# Patient Record
Sex: Female | Born: 1935 | Race: White | Hispanic: No | Smoking: Never smoker
Health system: Southern US, Community
[De-identification: ages and names within clinical notes are randomized; demographics above are authoritative.]

## PROBLEM LIST (undated history)

## (undated) DIAGNOSIS — G114 Hereditary spastic paraplegia: Secondary | ICD-10-CM

## (undated) DIAGNOSIS — I10 Essential (primary) hypertension: Secondary | ICD-10-CM

## (undated) DIAGNOSIS — R7303 Prediabetes: Secondary | ICD-10-CM

## (undated) DIAGNOSIS — K649 Unspecified hemorrhoids: Secondary | ICD-10-CM

## (undated) DIAGNOSIS — I639 Cerebral infarction, unspecified: Secondary | ICD-10-CM

## (undated) DIAGNOSIS — J189 Pneumonia, unspecified organism: Secondary | ICD-10-CM

## (undated) DIAGNOSIS — M549 Dorsalgia, unspecified: Secondary | ICD-10-CM

## (undated) DIAGNOSIS — K219 Gastro-esophageal reflux disease without esophagitis: Secondary | ICD-10-CM

## (undated) DIAGNOSIS — I679 Cerebrovascular disease, unspecified: Secondary | ICD-10-CM

## (undated) DIAGNOSIS — Z87442 Personal history of urinary calculi: Secondary | ICD-10-CM

## (undated) DIAGNOSIS — E7800 Pure hypercholesterolemia, unspecified: Secondary | ICD-10-CM

## (undated) DIAGNOSIS — E785 Hyperlipidemia, unspecified: Secondary | ICD-10-CM

## (undated) DIAGNOSIS — I2510 Atherosclerotic heart disease of native coronary artery without angina pectoris: Secondary | ICD-10-CM

## (undated) DIAGNOSIS — M109 Gout, unspecified: Secondary | ICD-10-CM

## (undated) DIAGNOSIS — G43909 Migraine, unspecified, not intractable, without status migrainosus: Secondary | ICD-10-CM

## (undated) DIAGNOSIS — IMO0001 Reserved for inherently not codable concepts without codable children: Secondary | ICD-10-CM

## (undated) DIAGNOSIS — I739 Peripheral vascular disease, unspecified: Secondary | ICD-10-CM

## (undated) DIAGNOSIS — G259 Extrapyramidal and movement disorder, unspecified: Secondary | ICD-10-CM

## (undated) DIAGNOSIS — M1990 Unspecified osteoarthritis, unspecified site: Secondary | ICD-10-CM

## (undated) DIAGNOSIS — D759 Disease of blood and blood-forming organs, unspecified: Secondary | ICD-10-CM

## (undated) HISTORY — DX: Atherosclerotic heart disease of native coronary artery without angina pectoris: I25.10

## (undated) HISTORY — PX: HEMORRHOID SURGERY: SHX153

## (undated) HISTORY — PX: TONSILLECTOMY: SUR1361

## (undated) HISTORY — DX: Extrapyramidal and movement disorder, unspecified: G25.9

## (undated) HISTORY — PX: CATARACT EXTRACTION W/ INTRAOCULAR LENS  IMPLANT, BILATERAL: SHX1307

## (undated) HISTORY — PX: COLONOSCOPY: SHX174

## (undated) HISTORY — DX: Hereditary spastic paraplegia: G11.4

## (undated) HISTORY — PX: DILATION AND CURETTAGE OF UTERUS: SHX78

## (undated) HISTORY — DX: Gastro-esophageal reflux disease without esophagitis: K21.9

## (undated) HISTORY — DX: Migraine, unspecified, not intractable, without status migrainosus: G43.909

## (undated) HISTORY — DX: Pure hypercholesterolemia, unspecified: E78.00

## (undated) HISTORY — PX: APPENDECTOMY: SHX54

## (undated) HISTORY — DX: Cerebrovascular disease, unspecified: I67.9

## (undated) HISTORY — DX: Gout, unspecified: M10.9

## (undated) HISTORY — DX: Dorsalgia, unspecified: M54.9

## (undated) HISTORY — PX: CHOLECYSTECTOMY: SHX55

## (undated) HISTORY — DX: Reserved for inherently not codable concepts without codable children: IMO0001

## (undated) HISTORY — PX: TUBAL LIGATION: SHX77

## (undated) HISTORY — PX: ROTATOR CUFF REPAIR: SHX139

---

## 1998-02-10 ENCOUNTER — Inpatient Hospital Stay (HOSPITAL_COMMUNITY): Admission: RE | Admit: 1998-02-10 | Discharge: 1998-02-16 | Payer: Self-pay | Admitting: Specialist

## 1998-02-16 ENCOUNTER — Inpatient Hospital Stay
Admission: RE | Admit: 1998-02-16 | Discharge: 1998-03-01 | Payer: Self-pay | Admitting: Physical Medicine and Rehabilitation

## 1999-05-31 ENCOUNTER — Encounter: Payer: Self-pay | Admitting: Neurology

## 1999-05-31 ENCOUNTER — Encounter: Payer: Self-pay | Admitting: Emergency Medicine

## 1999-05-31 ENCOUNTER — Inpatient Hospital Stay (HOSPITAL_COMMUNITY): Admission: EM | Admit: 1999-05-31 | Discharge: 1999-06-02 | Payer: Self-pay | Admitting: Neurology

## 1999-06-01 ENCOUNTER — Encounter: Payer: Self-pay | Admitting: Neurology

## 1999-10-03 ENCOUNTER — Encounter: Payer: Self-pay | Admitting: Internal Medicine

## 1999-10-03 ENCOUNTER — Encounter: Admission: RE | Admit: 1999-10-03 | Discharge: 1999-10-03 | Payer: Self-pay | Admitting: Internal Medicine

## 2000-03-21 ENCOUNTER — Inpatient Hospital Stay (HOSPITAL_COMMUNITY): Admission: RE | Admit: 2000-03-21 | Discharge: 2000-03-25 | Payer: Self-pay | Admitting: Specialist

## 2001-03-03 ENCOUNTER — Encounter: Payer: Self-pay | Admitting: Family Medicine

## 2001-03-03 ENCOUNTER — Encounter: Admission: RE | Admit: 2001-03-03 | Discharge: 2001-03-03 | Payer: Self-pay | Admitting: Family Medicine

## 2001-08-19 ENCOUNTER — Other Ambulatory Visit: Admission: RE | Admit: 2001-08-19 | Discharge: 2001-08-19 | Payer: Self-pay | Admitting: Family Medicine

## 2001-11-12 ENCOUNTER — Emergency Department (HOSPITAL_COMMUNITY): Admission: EM | Admit: 2001-11-12 | Discharge: 2001-11-12 | Payer: Self-pay | Admitting: Emergency Medicine

## 2002-09-19 ENCOUNTER — Inpatient Hospital Stay (HOSPITAL_COMMUNITY): Admission: EM | Admit: 2002-09-19 | Discharge: 2002-09-20 | Payer: Self-pay | Admitting: Emergency Medicine

## 2002-09-20 ENCOUNTER — Encounter: Payer: Self-pay | Admitting: Internal Medicine

## 2002-11-08 ENCOUNTER — Ambulatory Visit (HOSPITAL_COMMUNITY): Admission: RE | Admit: 2002-11-08 | Discharge: 2002-11-08 | Payer: Self-pay | Admitting: Internal Medicine

## 2003-03-15 ENCOUNTER — Ambulatory Visit (HOSPITAL_COMMUNITY): Admission: RE | Admit: 2003-03-15 | Discharge: 2003-03-15 | Payer: Self-pay | Admitting: Gastroenterology

## 2003-06-23 ENCOUNTER — Encounter: Admission: RE | Admit: 2003-06-23 | Discharge: 2003-06-23 | Payer: Self-pay | Admitting: Neurology

## 2003-06-27 ENCOUNTER — Inpatient Hospital Stay (HOSPITAL_COMMUNITY): Admission: EM | Admit: 2003-06-27 | Discharge: 2003-06-29 | Payer: Self-pay | Admitting: Emergency Medicine

## 2005-05-20 ENCOUNTER — Ambulatory Visit (HOSPITAL_COMMUNITY): Admission: RE | Admit: 2005-05-20 | Discharge: 2005-05-20 | Payer: Self-pay | Admitting: Gastroenterology

## 2005-06-02 ENCOUNTER — Emergency Department (HOSPITAL_COMMUNITY): Admission: EM | Admit: 2005-06-02 | Discharge: 2005-06-02 | Payer: Self-pay | Admitting: Emergency Medicine

## 2006-03-25 ENCOUNTER — Encounter: Admission: RE | Admit: 2006-03-25 | Discharge: 2006-03-25 | Payer: Self-pay | Admitting: Family Medicine

## 2007-11-12 ENCOUNTER — Emergency Department (HOSPITAL_COMMUNITY): Admission: EM | Admit: 2007-11-12 | Discharge: 2007-11-12 | Payer: Self-pay | Admitting: Emergency Medicine

## 2008-08-26 ENCOUNTER — Encounter: Admission: RE | Admit: 2008-08-26 | Discharge: 2008-08-26 | Payer: Self-pay | Admitting: *Deleted

## 2008-09-06 ENCOUNTER — Ambulatory Visit (HOSPITAL_COMMUNITY): Admission: RE | Admit: 2008-09-06 | Discharge: 2008-09-06 | Payer: Self-pay | Admitting: Gastroenterology

## 2009-02-15 ENCOUNTER — Emergency Department (HOSPITAL_COMMUNITY): Admission: EM | Admit: 2009-02-15 | Discharge: 2009-02-15 | Payer: Self-pay | Admitting: Emergency Medicine

## 2009-02-22 ENCOUNTER — Encounter: Admission: RE | Admit: 2009-02-22 | Discharge: 2009-02-22 | Payer: Self-pay | Admitting: Gastroenterology

## 2009-03-10 ENCOUNTER — Emergency Department (HOSPITAL_COMMUNITY): Admission: EM | Admit: 2009-03-10 | Discharge: 2009-03-10 | Payer: Self-pay | Admitting: Emergency Medicine

## 2009-03-13 ENCOUNTER — Inpatient Hospital Stay (HOSPITAL_COMMUNITY): Admission: EM | Admit: 2009-03-13 | Discharge: 2009-03-17 | Payer: Self-pay | Admitting: Emergency Medicine

## 2009-05-23 ENCOUNTER — Ambulatory Visit (HOSPITAL_COMMUNITY): Admission: RE | Admit: 2009-05-23 | Discharge: 2009-05-23 | Payer: Self-pay | Admitting: Gastroenterology

## 2010-05-09 ENCOUNTER — Encounter: Admission: RE | Admit: 2010-05-09 | Discharge: 2010-05-09 | Payer: Self-pay | Admitting: Family Medicine

## 2010-07-30 ENCOUNTER — Encounter: Payer: Self-pay | Admitting: Gastroenterology

## 2010-10-10 LAB — COMPREHENSIVE METABOLIC PANEL
ALT: 20 U/L (ref 0–35)
AST: 23 U/L (ref 0–37)
Albumin: 3.8 g/dL (ref 3.5–5.2)
Alkaline Phosphatase: 98 U/L (ref 39–117)
BUN: 19 mg/dL (ref 6–23)
CO2: 23 mEq/L (ref 19–32)
Calcium: 9.1 mg/dL (ref 8.4–10.5)
Chloride: 110 mEq/L (ref 96–112)
Creatinine, Ser: 0.86 mg/dL (ref 0.4–1.2)
GFR calc Af Amer: >60 mL/min (ref >60)
GFR calc non Af Amer: >60 mL/min (ref >60)
Glucose, Bld: 111 mg/dL — ABNORMAL HIGH (ref 70–99)
Potassium: 4 mEq/L (ref 3.5–5.1)
Sodium: 140 mEq/L (ref 135–145)
Total Bilirubin: 0.8 mg/dL (ref 0.3–1.2)
Total Protein: 6.8 g/dL (ref 6.0–8.3)

## 2010-10-10 LAB — DIFFERENTIAL
Basophils Absolute: 0 10*3/uL (ref 0.0–0.1)
Basophils Relative: 1 % (ref 0–1)
Eosinophils Absolute: 0.1 10*3/uL (ref 0.0–0.7)
Eosinophils Relative: 2 % (ref 0–5)
Lymphocytes Relative: 23 % (ref 12–46)
Lymphs Abs: 1.2 10*3/uL (ref 0.7–4.0)
Monocytes Absolute: 0.3 10*3/uL (ref 0.1–1.0)
Monocytes Relative: 6 % (ref 3–12)
Neutro Abs: 3.5 10*3/uL (ref 1.7–7.7)
Neutrophils Relative %: 68 % (ref 43–77)

## 2010-10-10 LAB — CBC
HCT: 35.4 % — ABNORMAL LOW (ref 36.0–46.0)
Hemoglobin: 12 g/dL (ref 12.0–15.0)
MCHC: 33.8 g/dL (ref 30.0–36.0)
MCV: 90.8 fL (ref 78.0–100.0)
Platelets: 192 10*3/uL (ref 150–400)
RBC: 3.89 MIL/uL (ref 3.87–5.11)
RDW: 14.5 % (ref 11.5–15.5)
WBC: 5.2 10*3/uL (ref 4.0–10.5)

## 2010-10-10 LAB — PROTIME-INR
INR: 1.02 (ref 0.00–1.49)
Prothrombin Time: 13.3 seconds (ref 11.6–15.2)

## 2010-10-10 LAB — TYPE AND SCREEN
ABO/RH(D): O POS
Antibody Screen: NEGATIVE

## 2010-10-12 LAB — GAMMA GT
GGT: 343 U/L — ABNORMAL HIGH (ref 7–51)
GGT: 634 U/L — ABNORMAL HIGH (ref 7–51)

## 2010-10-12 LAB — DIFFERENTIAL
Basophils Absolute: 0 10*3/uL (ref 0.0–0.1)
Basophils Absolute: 0 10*3/uL (ref 0.0–0.1)
Basophils Absolute: 0 10*3/uL (ref 0.0–0.1)
Basophils Relative: 0 % (ref 0–1)
Eosinophils Absolute: 0 10*3/uL (ref 0.0–0.7)
Eosinophils Relative: 0 % (ref 0–5)
Lymphocytes Relative: 2 % — ABNORMAL LOW (ref 12–46)
Lymphocytes Relative: 3 % — ABNORMAL LOW (ref 12–46)
Lymphocytes Relative: 8 % — ABNORMAL LOW (ref 12–46)
Lymphs Abs: 0.2 10*3/uL — ABNORMAL LOW (ref 0.7–4.0)
Monocytes Absolute: 0.3 10*3/uL (ref 0.1–1.0)
Monocytes Relative: 6 % (ref 3–12)
Neutro Abs: 8.6 10*3/uL — ABNORMAL HIGH (ref 1.7–7.7)
Neutrophils Relative %: 81 % — ABNORMAL HIGH (ref 43–77)
Neutrophils Relative %: 92 % — ABNORMAL HIGH (ref 43–77)

## 2010-10-12 LAB — COMPREHENSIVE METABOLIC PANEL
ALT: 45 U/L — ABNORMAL HIGH (ref 0–35)
ALT: 56 U/L — ABNORMAL HIGH (ref 0–35)
AST: 63 U/L — ABNORMAL HIGH (ref 0–37)
AST: 88 U/L — ABNORMAL HIGH (ref 0–37)
Albumin: 2.3 g/dL — ABNORMAL LOW (ref 3.5–5.2)
Alkaline Phosphatase: 460 U/L — ABNORMAL HIGH (ref 39–117)
Alkaline Phosphatase: 467 U/L — ABNORMAL HIGH (ref 39–117)
Alkaline Phosphatase: 496 U/L — ABNORMAL HIGH (ref 39–117)
BUN: 21 mg/dL (ref 6–23)
BUN: 8 mg/dL (ref 6–23)
BUN: 9 mg/dL (ref 6–23)
CO2: 22 mEq/L (ref 19–32)
CO2: 25 mEq/L (ref 19–32)
Calcium: 7.8 mg/dL — ABNORMAL LOW (ref 8.4–10.5)
Calcium: 7.9 mg/dL — ABNORMAL LOW (ref 8.4–10.5)
Chloride: 109 mEq/L (ref 96–112)
Chloride: 109 mEq/L (ref 96–112)
Creatinine, Ser: 0.77 mg/dL (ref 0.4–1.2)
Creatinine, Ser: 0.87 mg/dL (ref 0.4–1.2)
Creatinine, Ser: 0.87 mg/dL (ref 0.4–1.2)
Creatinine, Ser: 1.17 mg/dL (ref 0.4–1.2)
GFR calc Af Amer: >60 mL/min (ref >60)
GFR calc Af Amer: >60 mL/min (ref >60)
GFR calc non Af Amer: >60 mL/min (ref >60)
GFR calc non Af Amer: >60 mL/min (ref >60)
Glucose, Bld: 103 mg/dL — ABNORMAL HIGH (ref 70–99)
Glucose, Bld: 146 mg/dL — ABNORMAL HIGH (ref 70–99)
Potassium: 2.7 mEq/L — CL (ref 3.5–5.1)
Potassium: 2.8 mEq/L — ABNORMAL LOW (ref 3.5–5.1)
Potassium: 3.3 mEq/L — ABNORMAL LOW (ref 3.5–5.1)
Sodium: 138 mEq/L (ref 135–145)
Sodium: 139 mEq/L (ref 135–145)
Total Bilirubin: 3.2 mg/dL — ABNORMAL HIGH (ref 0.3–1.2)
Total Bilirubin: 4.5 mg/dL — ABNORMAL HIGH (ref 0.3–1.2)
Total Bilirubin: 6.8 mg/dL — ABNORMAL HIGH (ref 0.3–1.2)
Total Protein: 4.7 g/dL — ABNORMAL LOW (ref 6.0–8.3)
Total Protein: 4.9 g/dL — ABNORMAL LOW (ref 6.0–8.3)
Total Protein: 5.6 g/dL — ABNORMAL LOW (ref 6.0–8.3)
Total Protein: 6.1 g/dL (ref 6.0–8.3)

## 2010-10-12 LAB — LIPID PANEL
Cholesterol: 122 mg/dL (ref 0–200)
HDL: 20 mg/dL — ABNORMAL LOW (ref >39)
LDL Cholesterol: 73 mg/dL (ref 0–99)
Total CHOL/HDL Ratio: 6.1 RATIO
Triglycerides: 144 mg/dL (ref <150)

## 2010-10-12 LAB — CBC
HCT: 30.7 % — ABNORMAL LOW (ref 36.0–46.0)
HCT: 37.1 % (ref 36.0–46.0)
HCT: 40.1 % (ref 36.0–46.0)
Hemoglobin: 10.6 g/dL — ABNORMAL LOW (ref 12.0–15.0)
Hemoglobin: 13.7 g/dL (ref 12.0–15.0)
MCHC: 33.8 g/dL (ref 30.0–36.0)
MCHC: 34.1 g/dL (ref 30.0–36.0)
MCV: 90.4 fL (ref 78.0–100.0)
MCV: 91 fL (ref 78.0–100.0)
MCV: 91.6 fL (ref 78.0–100.0)
MCV: 91.8 fL (ref 78.0–100.0)
MCV: 92.3 fL (ref 78.0–100.0)
Platelets: 100 10*3/uL — ABNORMAL LOW (ref 150–400)
Platelets: 116 10*3/uL — ABNORMAL LOW (ref 150–400)
Platelets: 130 10*3/uL — ABNORMAL LOW (ref 150–400)
Platelets: 206 10*3/uL (ref 150–400)
RBC: 3.34 MIL/uL — ABNORMAL LOW (ref 3.87–5.11)
RBC: 3.48 MIL/uL — ABNORMAL LOW (ref 3.87–5.11)
RDW: 13.9 % (ref 11.5–15.5)
RDW: 15.1 % (ref 11.5–15.5)
RDW: 15.3 % (ref 11.5–15.5)
RDW: 15.5 % (ref 11.5–15.5)
WBC: 7.3 10*3/uL (ref 4.0–10.5)
WBC: 9.3 10*3/uL (ref 4.0–10.5)

## 2010-10-12 LAB — BASIC METABOLIC PANEL
BUN: 15 mg/dL (ref 6–23)
Calcium: 8.2 mg/dL — ABNORMAL LOW (ref 8.4–10.5)
Creatinine, Ser: 1 mg/dL (ref 0.4–1.2)
GFR calc Af Amer: >60 mL/min (ref >60)
GFR calc non Af Amer: 53 mL/min — ABNORMAL LOW (ref >60)

## 2010-10-12 LAB — TSH: TSH: 0.53 u[IU]/mL (ref 0.350–4.500)

## 2010-10-12 LAB — LIPASE, BLOOD
Lipase: 14 U/L (ref 11–59)
Lipase: 15 U/L (ref 11–59)

## 2010-10-12 LAB — URINALYSIS, ROUTINE W REFLEX MICROSCOPIC
Glucose, UA: NEGATIVE mg/dL
pH: 5.5 (ref 5.0–8.0)

## 2010-10-12 LAB — HEMOGLOBIN A1C: Mean Plasma Glucose: 134 mg/dL

## 2010-10-12 LAB — URINE MICROSCOPIC-ADD ON

## 2010-10-12 LAB — ANA: Anti Nuclear Antibody(ANA): NEGATIVE

## 2010-10-12 LAB — LACTATE DEHYDROGENASE: LDH: 163 U/L (ref 94–250)

## 2010-10-12 LAB — H. PYLORI ANTIBODY, IGG: H Pylori IgG: <0.4 {ISR}

## 2010-10-12 LAB — LACTIC ACID, PLASMA: Lactic Acid, Venous: 1.1 mmol/L (ref 0.5–2.2)

## 2010-10-12 LAB — HEPATIC FUNCTION PANEL
Albumin: 2.3 g/dL — ABNORMAL LOW (ref 3.5–5.2)
Total Bilirubin: 5.8 mg/dL — ABNORMAL HIGH (ref 0.3–1.2)

## 2010-10-12 LAB — PROTIME-INR: Prothrombin Time: 14.9 seconds (ref 11.6–15.2)

## 2010-10-12 LAB — URINE CULTURE

## 2010-10-13 LAB — DIFFERENTIAL
Basophils Relative: 1 % (ref 0–1)
Lymphocytes Relative: 5 % — ABNORMAL LOW (ref 12–46)
Monocytes Absolute: 0.7 10*3/uL (ref 0.1–1.0)
Monocytes Relative: 8 % (ref 3–12)
Neutro Abs: 7.6 10*3/uL (ref 1.7–7.7)

## 2010-10-13 LAB — CBC
HCT: 37.5 % (ref 36.0–46.0)
Platelets: 180 10*3/uL (ref 150–400)
RDW: 12.9 % (ref 11.5–15.5)

## 2010-10-13 LAB — COMPREHENSIVE METABOLIC PANEL
Albumin: 3.4 g/dL — ABNORMAL LOW (ref 3.5–5.2)
Alkaline Phosphatase: 253 U/L — ABNORMAL HIGH (ref 39–117)
BUN: 18 mg/dL (ref 6–23)
Potassium: 3.5 mEq/L (ref 3.5–5.1)
Total Protein: 6.2 g/dL (ref 6.0–8.3)

## 2010-10-13 LAB — POCT CARDIAC MARKERS
CKMB, poc: 1.1 ng/mL (ref 1.0–8.0)
Myoglobin, poc: 198 ng/mL (ref 12–200)
Troponin i, poc: <0.05 ng/mL (ref 0.00–0.09)

## 2010-11-23 NOTE — Discharge Summary (Signed)
Vicki Stein, Vicki Stein                         ACCOUNT NO.:  [id number]   MEDICAL RECORD NO.:  [id number]                   PATIENT TYPE:  INP   LOCATION:  5504                                 FACILITY:  [facility]   PHYSICIAN:  Deirdre Peer. Polite, M.D.              DATE OF BIRTH:  03-16-1928   DATE OF ADMISSION:  06/27/2003  DATE OF DISCHARGE:  06/29/2003                                 DISCHARGE SUMMARY   DISCHARGE DIAGNOSES:  1. Transient ischemic attack characterized by transient left arm numbness x     10 minutes; no residual deficits.  2. History of cerebrovascular accident.  3. Hypertension.  4. Hypercholesterolemia.  5. Depression.  6. Anxiety.  7. Gastroesophageal reflux disease.   DISCHARGE MEDICATIONS:  1. Aggrenox 25/200 one tablet p.o. b.i.d.  The patient is asked to resume home medications.  1. Lisinopril/hydrochlorothiazide daily.  2. Xanax 0.5 mg q.h.s. p.r.n.  3. Patanol eye drops b.i.d.  4. Robaxin 500 mg t.i.d. as needed.  5. Prozac 20 mg daily.  6. Reglan 10 mg a.c. and h.s.  7. Nexium 20 mg daily.  8. Zocor 20 mg daily.  9. Allegra 180 mg daily.  10.      Magnesium sulfate.  11.      Calcium chloride.  12.      Flonase.  13.      Prilosec.   CONSULTATIONS:  None.   STUDIES:  1. The patient had carotid Doppler ultrasound which showed no internal     carotid stenosis.  2. The patient had CT of the head which showed possible left internal     carotid infarct.  3. MRI/MRA: MRI without acute infarct.  MRA without any stenosis.  4. EKG: Normal sinus rhythm.  5. BMET  within normal limits..  6. Total cholesterol 208, HDL 44, LDL 126.   DISPOSITION:  The patient is to be discharged home in stable condition for  followup with primary MD in approximately 7 to 10 days.   HISTORY OF PRESENT ILLNESS:  Vicki Stein is a 77 year old female with above  medical problems who presented to the ED with left hand and arm clumsiness  and paresthesia that lasted  approximately 5 to 10 minutes.  The patient also  described right side of her tongue felt numb.  Because of her prior history  of TIAs in the past, the patient was presented to the ED for further  evaluation and treatment.   PAST MEDICAL HISTORY:  As stated above.   MEDICATIONS:  As stated above.   SOCIAL HISTORY:  Negative for tobacco, alcohol, or drugs.   FAMILY HISTORY:  Noncontributory.   HOSPITAL COURSE:  The patient was admitted to a medicine floor bed for  evaluation and treatment of transient neurologic deficit characterized by  clumsiness and paresthesia.  Of note, the patient did have a CT in the ED  which  showed questionable left internal  carotid infarct of any acute  nature.  The patient was placed on telemetry.  She was found to be in normal  sinus rhythm.  Had further studies, carotid ultrasound which showed no  stenosis and MRI/MRA.  The patient's MRI was negative for any acute infarct;  therefore, making finding on CT an artifact.  The patient also had an MRA  which showed no acute stenosis.  The patient had screening lab work which  revealed her lipids to be greater than optimal.  Her LDL is at 126.  It will  be recommended that the patient increase her Zocor to 40 mg daily.   In the interim, the patient was seen by PT and OT, and no needs were  identified.  As stated, the patient's deficit only lasted approximately 10  minutes at home.  Her MRI does not show acute finding.  This is most  consistent with a TIA.  The patient was on aspirin for CVA prophylaxis.  At  this time, we will initiate Aggrenox 1 tablet b.i.d.  The patient will have  further risk factor modification and followup on outpatient basis by her  primary MD.                                                Deirdre Peer. Polite, M.D.    RDP/MEDQ  D:  06/29/2003  T:  07/01/2003  Job:  [id number]   cc:   Molly Maduro L. Foy [place], M.D.  [address] [place]  [place] [address]  Fax: [phone]

## 2010-11-23 NOTE — H&P (Signed)
[facility]  Patient:    Vicki Stein, Vicki Stein                     MRN: [id number] Adm. Date:  03/21/00 Attending:  R. Valma Cava, M.D. Dictator:   Druscilla Brownie. Shela Nevin, P.A. CC:         Janae Bridgeman. Eloise Harman., M.D.   History and Physical  DATE OF BIRTH:  03-01-1928.  DATE OF HISTORY AND PHYSICAL:  March 13, 2000.  CHIEF COMPLAINT:  "Pain in my right shoulder."  HISTORY OF PRESENT ILLNESS:  This is a 77 year old lady who has been seen by [place] for continuing problems concerning her right shoulder.  We have had to inject corticosteroids into the right shoulder, which really has not helped with her overall pain.  She has limitation range of motion, positive impingement, and x-rays have shown a high-riding humeral head, degenerative changes of the glenoid, spurring of the acromion, and a hypertrophic degenerative AC joint.  This lady has spasticity with scissor gait in the lower extremities and must depend on her upper extremities to get about.  She uses a cane and various other appliances that assist her in walking.  For this reason, it is imperative that she have excellent shoulder function.  After much discussion, it was decided to go ahead with right shoulder open subacromial decompression, distal clavicle resection, and repair of the rotator cuff of the right shoulder.  The patient has been seen preoperatively by Dr. Dimas Alexandria, who has cleared her for surgery.  She is on chronic aspirin from an old "stroke," and she will stop this seven days prior to surgery.  PAST MEDICAL HISTORY:  Stroke in the past, as mentioned above.  She also has a stomach disorder with spasticity.  She also has some mild hypertension.  She has had a cholecystectomy in 1997, left shoulder repair of the rotator cuff on February 10, 1998, by Dr. Thomasena Edis.  MEDICATIONS:  Zocor tablet 20 mg in the evening, Prempro 0.625 mg/2.5 mg one q.d., _____ 80 mg one q.d.,  Plendil 2.5 mg one q.d., Celebrex 200 mg one q.d. , Vitamin E a day, Prilosec capsule one p.o. q.d., Allegra capsule 60 mg one b.i.d., Flonase spray q.a.m., one coated aspirin 325 q.d., will stop seven days prior to surgery.  ALLERGIES:  PENICILLIN, which causes swelling and rash, CODEINE and HYDROCODONE, which cause severe stomach cramps, as well as ULTRAM and REGULAR ASPIRIN.  SOCIAL HISTORY:  The patient lives alone, is independent, neither smokes nor drinks.  FAMILY HISTORY:  Positive for lower extremity spasticity.  REVIEW OF SYSTEMS:  CENTRAL NERVOUS SYSTEM:  No seizures, paralysis, numbness, double vision.  RESPIRATORY:  No productive cough, no hemoptysis, no shortness of breath.  CARDIOVASCULAR:  No chest pain, no angina, no orthopnea. GASTROINTESTINAL:  No nausea, vomiting, melena, or bloody stools. GENITOURINARY:  No discharge, dysuria, hematuria.  MUSCULOSKELETAL:  Primarily in present illness.  PHYSICAL EXAMINATION:  GENERAL:  An alert, cooperative, friendly 77 year old white female, who looks younger than her stated age.  VITAL SIGNS:  Blood pressure 148/78, pulse 80, respirations 12.  HEENT:  Normocephalic, wears glasses.  PERRLA.  Oropharynx is clear.  CHEST:  Clear to auscultation, no rhonchi, no rales.  HEART:  Rate, no murmurs are heard.  ABDOMEN:  Soft, nontender.  Liver and spleen not felt.  GENITALIA, RECTAL, PELVIC, BREASTS:  Not done, not pertinent to present illness.  MUSCULOSKELETAL:  Right shoulder as in present  illness above.  ADMITTING DIAGNOSES: 1. Right shoulder osteoarthritis with rotator cuff tear. 2. Hypertension. 3. Gastritis. 4. History of stroke. 5. Spasticity of the lower extremities.  PLAN:  The patient will undergo right shoulder open subacromial decompression, distal clavicle resection, and rotator cuff repair of the right shoulder. Should there be any medical problems, we will certainly call Dr. Dimas Alexandria to follow  along with [place].  The patient will need a home bath aide as well as occupational therapy after her discharge home so that she may resume her active lifestyle in the not-too-distant future. DD:  03/13/00 TD:  03/13/00 Job: [id number]

## 2010-11-23 NOTE — Op Note (Signed)
[facility]  Patient:    Vicki Stein, Vicki Stein                 MRN: [id number] Proc. Date: 03/21/00 Adm. Date:  [id number] Attending:  Erasmo Leventhal                           Operative Report  PREOPERATIVE DIAGNOSIS:  Right shoulder large rotator cuff tear with symptomatic degenerative acromioclavicular joint.  POSTOPERATIVE DIAGNOSIS:  Right shoulder large rotator cuff tear with symptomatic degenerative acromioclavicular joint.  OPERATION:  Right shoulder open subacromial decompression.  Rotator cuff repair.  Distal clavicle resection Mumford procedure.  SURGEON:  R. Valma Cava, M.D.  ASSISTANT: Alexzandrew L. Perkins, P.A.-C.  ANESTHESIA:  Preoperative interscalene block with general endotracheal.  ESTIMATED BLOOD LOSS:  Less than 20 cc.  DRAINS:  None.  COMPLICATIONS:  None.  DISPOSITION:  PACU stable.  DESCRIPTION OF PROCEDURE:  Patient was counseled in the holding area, correct side was identified.  The IV was started, antibiotics were given, block was administered.  She was taken to the operating room, placed in the supine position under general endotracheal anesthesia.  Patient was placed in a modified beach chair position.  Right shoulder examined.  Good range of motion, stable.  Prepped with DuraPrep and all draped in a sterile fashion. Oblique incision made over the [facility] joint, anterior acromion through the skin and subcutaneous tissue.  Small veins electrocoagulated.  The deltotrapezial fascia was split in line with the skin incision.  Subperiosteal dissection undertaken on the distal AC joint and clavicle and anterior acromion.  The clavipectoral fascia was opened, deltoid was split for a total of 4 cm. Axillary node was protected, and a retention suture was placed in order to prevent any inadvertent split in the deltoid.  Subacromial bursa was hypertroph8ied, was excised with scissors.  Distal clavicle was excised  approximately 1.5 cm.  She had a marked type 3 acromion.  Anterior inferior acromioplasty was performed and smoothed down with a rasp.  The rotator cuff was inspected.  She had a large tear with retraction.  It was mobilized on its bursal surface and intra-articular surface.  It could then be brought side to side.  Biceps tendon was intact but there was a biceps tendinopathy present but it did not require release or tenodesis.  At this point in time beginning medial and working laterally with #1 Ethibond sutures, sequential sutures were placed in the rotator cuff in a side to side fashion also incorporating the biceps tendon.  This was done with the arm at the side and no undue tension on the repair.  A trough was made laterally.  An ARTH-Rx absorbable port screw anchor was placed and then the tendon was repaired down to the bleeding bony bed.  At this time the suture line remained stable and there was no undue pressure. The joint was irrigated during the closure.  A meticulous closure deltotrapezial fascia and anterior deltoid was repaired back to the acromion with Vicryl interrupted in the fascia, subcu Vicryl, skin closed with staples.  Sterile dressings were applied at the shoulder.  She was placed on an abduction pillow.  There were no complications.  She was awakened, extubated and taken to the recovery room in stable condition. Sponge and needle count were correct. DD:  03/21/00 TD:  03/23/00 Job: [id number]

## 2010-11-23 NOTE — Discharge Summary (Signed)
[facility]  Patient:    Vicki Stein, Vicki Stein                 MRN: [id number] Adm. Date:  [id number] Disc. Date: [id number] Attending:  Erasmo Leventhal Dictator:   Della Goo, P.A.-C.                           Discharge Summary  ADMISSION DIAGNOSES: 1. Rotator cuff tear, right shoulder and osteoarthritis of the right shoulder. 2. Hypertension. 3. Gastritis. 4. History of stroke. 5. Spasticity of the lower extremities bilaterally.  DISCHARGE DIAGNOSES: 1. Rotator cuff tear, right shoulder and osteoarthritis of the right shoulder. 2. Hypertension. 3. Gastritis. 4. History of stroke. 5. Spasticity of the lower extremities bilaterally.  PROCEDURES:  On March 21, 2000, the patient underwent right shoulder subacromial decompression, rotator cuff repair, distal clavicle resection Mumford procedure performed by R. Valma Cava, M.D., assisted by Alexzandrew L. Perkins, P.A.-C. under general anesthesia.  CONSULTATIONS:  None.  BRIEF HISTORY:  The patient is a 77 year old white female with chronic right shoulder pain secondary to degenerative arthritis of the [facility] joint and right shoulder rotator cuff tear.  On exam, she had limited range of motion with positive impingement sign.  X-rays revealed high-riding humeral head with degenerative changes of the glenoid, spurring of the acromion, and hypertrophic degenerative AC joints.  It was felt she would require surgical intervention and was admitted for the procedure stated above.  HOSPITAL COURSE:  Unfortunately, the patient has a condition with spasticity and scissor gait in the lower extremities and must depend on the upper extremities to ambulate.  She utilizes a cane on a regular basis.  During the hospital stay, she was treated with physical therapy for ambulation and also occupational therapy for ADLs.  She required intensive therapy to become independent for return to her  home.  Prior to discharge, she was able to make gains with ambulation, walking as far as 75 feet with a hemiwalker and standby assistance in the hospital.  She received occupational therapy the day prior to discharge and had gained some independence with ADLs at that time.  The patient initially was treated with IV and IM analgesics and weaned to p.o. analgesics without difficulty.  She did develop a slight rash on the first postoperative day, cause unidentified.  She was afebrile, and vital signs were stable through much of the hospitalization.  Neurovascular and motor function remained intact in the right upper extremity.  Dressing was changed daily, and would was healing well without signs of drainage or infection.  She received a care management consult, and referral was made for OT and PT as well as bath aid.  The patient was felt to be stable for transfer to her home on March 25, 2000.  CONDITION UPON DISCHARGE: Stable.  LABORATORY DATA:  Admission labs include coag studies which were normal, CBC within normal limits, CMET within normal limits with exception of glucose 116. Urinalysis on admission was negative for urinary tract infection; however, it did show 100 protein and small hemoglobin.  Chest x-ray on admission was done at [facility] on May 29, 1999, and showed no active disease.  EKG on admission, also taken from [facility], showed sinus tachycardia with rate of 100 with no significant change.  PLAN:  The patient will be discharged to his home.  Arrangements have been made for her to have home health physical therapy and occupational  therapy. She will also have a bath aide.  Dressing change will be done daily, and she is given supplies to do so with the assistance of the aide at home.  The patient will be allowed to shower.  She will wear her sling at all times with removal only for pendulum exercises.  DISCHARGE MEDICATIONS: 1.  Percocet, #30, 1 to 2 every 4 to 6 hours as needed for pain. 2. Robaxin 500 mg, #30, 1 every 8 hours as needed for spasm.  FOLLOWUP:  With Dr. Thomasena Edis two weeks from the date of her surgery.  All questions have been encouraged and discussed, and she has been advised to call the office if she has questions or concerns prior to her return office visit. DD:  03/25/00 TD:  03/26/00 Job: [id number]

## 2010-11-23 NOTE — Discharge Summary (Signed)
[place]  Patient:    Vicki Stein                  MRN: [id number] Adm. Date:  [id number] Disc. Date: 06/02/99 Attending:  Baldo Daub CC:         Janae Bridgeman Eloise Harman., M.D.                           Discharge Summary  DIAGNOSES: 1. Left parietal infarction. 2. Hypertension. 3. Hypercholesterolemia. 4. Rotator cuff, right shoulder. 5. Gastroesophageal reflux disease.  PROCEDURES: 1. MRI/MRA of the brain. 2. Transthoracic echocardiogram. 3. Carotid Doppler ultrasound.  SUMMARY OF HOSPITALIZATION:  Patient is a 77 year old woman with past medical history of hypertension, hypercholesterolemia who presented to [facility] Emergency Room with a history of three episodes of slurred speech and numbness f her tongue, lasted between 10-15 minutes.  Upon admission her neurological exam was essentially normal.  The patient was subsequently admitted to the neuroscience nit for further workup and testing for cerebrovascular disease.  Initial management  consisted of IV fluids management, normal saline, oxygen 2 L, and heparin ischemic stroke protocol.  Testing including MRI/MRA of the brain showed an acute left parietal infarction, no hemodynamically significant stenosis.  Transthoracic echocardiogram essentially  normal.  Ejection fraction normal.  No thrombus.  Patient remained stable throughout hospitalization.  Neurologic status normal at discharge.  Patient was discharged home in stable condition.  DISCHARGE MEDICATIONS: 1. Prilosec 20 one q.d. 2. Thiamine 80 one q.d. 3. Celebrex 200 q.d. 4. Zocor 20 one q.d. 5. Prempro 0.625 one q.d. 6. Allegra 60 b.i.d. 7. Enteric-coated aspirin 325 mg q.d. for secondary stroke prevention.  OUTPATIENT FOLLOWUP:  Patient was given instructions to call her family care physician, Dr. Lendell Caprice, for a followup in 2-3 weeks from discharge. DD: 06/02/99 TD:  06/02/99 Job:  [id number]

## 2010-11-23 NOTE — H&P (Signed)
[place]  Patient:    Vicki Stein                  MRN: [id number] Adm. Date:  [id number] Attending:  Glean Hess D                         History and Physical  CHIEF COMPLAINT:  Slurred speech.  HISTORY OF PRESENT ILLNESS:  Patient is a 77 year old woman who presented to the emergency room at [facility] with three episodes of slurred speech lasting about 10 to 12 minutes.  The first one happened yesterday around 2 p.m. while she was  talking on the phone to her son, the second episode, early at lunch-time, when alf of her tongue got numb as well and the last episode this afternoon when she was  also noted to have drooping of her face and the slurred speech lasted about 10 o 15 minutes.  There is no prior history of strokes or TIAs.  The patient denies weakness of her arms or legs.  No ataxia.  No double vision.  PAST MEDICAL HISTORY:  Hypercholesterolemia.  Hypertension.  Rotator cuff of the right shoulder.  There is spastic stiffness of the lower extremities, congenital disorder.  Gastroesophageal reflux disease and chronic headaches.  MEDICATIONS: 1. Prilosec 20 mg once a day. 2. Diovan 80 mg once a day. 3. Celebrex 200 mg q.d. 4. Zocor 20 mg once a day. 5. Prempro 0.625 mg once a day. 6. Allegra 60 mg twice a day. 7. Flonase nasal spray p.r.n.  PRIMARY CARE PHYSICIAN:  Janae Bridgeman. Eloise Harman., M.D.  ALLERGIES:  PENICILLIN, CODEINE, MOTRIN, IBUPROFEN and ADVIL.  SOCIAL HISTORY:  Lives by herself, independent.  She uses a cane for ambulation. Nonsmoker and nondrinker.  FAMILY HISTORY:  Mother and father died of old age.  No family history of strokes or TIAs.  PHYSICAL EXAMINATION:  VITAL SIGNS:  Blood pressure 162/84, pulse 87, respirations 20, temperature 98.2.  GENERAL:  Mildly obese woman lying on the stretcher in no acute distress.  HEENT:  Head normocephalic, atraumatic.  NECK:  Supple.  No  bruits.  LUNGS:  Clear bilaterally.  HEART SOUNDS:  Regular and rhythmic.  No murmurs.  ABDOMEN:  Soft.  Bowel sounds present.  No visceromegaly.  EXTREMITIES:  No cyanosis or edema.  NEUROLOGIC:  Awake, alert and oriented.  Speech, hearing, memory and language normal.  Cranial nerves II-XII:  Pupils equal, round and reactive.  Bilateral extraoculocephalic movement intact.  Face symmetric.  Tongue in the midline. Palate elevates symmetrically.  Hearing intact.  Visual fields full to confrontation.  Motor strength:  Upper extremities 5/5, lower extremities 4/5; equal bilaterally. No focal abnormalities.  Motor tone increased in both lower extremities. Reflexes +3, lower extremities.  Plantars upgoing bilaterally.  Coordination normal. Sensory normal.  Gait was not evaluated.  NEUROIMAGING:  I have personally reviewed CT scan of the head which shows only atrophy, no acute changes, no hemorrhage, no hydrocephalus.  IMPRESSION: 1. Transient ischemic attack. 2. Hypertension. 3. Hypercholesterolemia. 4. Rotator cuff, right shoulder. 5. Gastroesophageal reflux disease.  PLAN AND RECOMMENDATIONS:  Diagnosis, condition and further interventions were discussed at length with the patient and her family at the bedside.  Would like to admit this patient to the hospital for further workup and testing for cerebrovascular disease.  Even though the patient has had only TIA symptoms, it is well-recognized that the risks of a  stroke in the next 30 days is between 6 to 0% of having a stroke.  Further workup and testing will consist of noninvasive imaging of her intra- and extracranial vessels with magnetic resonance angiography and carotid ultrasound; also, an echocardiogram will be requested for further evaluation of potential embolic sources as the cause of her TIAs.  Until the workup is completed, patient will remain on the heparin ischemic stroke protocol to maintain PTTs between  60 and 80 seconds.  Will resume home medications except for antihypertensive medications until workup is completed.  Thank you for allowing me to participate in the care of this patient. DD:  05/31/99 TD:  06/01/99 Job: [id number]

## 2010-11-23 NOTE — H&P (Signed)
NAME:  Vicki Stein, Vicki Stein                         ACCOUNT NO.:  [id number]   MEDICAL RECORD NO.:  [id number]                   PATIENT TYPE:  INP   LOCATION:  1843                                 FACILITY:  [facility]   PHYSICIAN:  Corinna L. Lendell Caprice, MD             DATE OF BIRTH:  1928/03/21   DATE OF ADMISSION:  06/27/2003  DATE OF DISCHARGE:                                HISTORY & PHYSICAL   CHIEF COMPLAINT:  Left arm tingling.   HISTORY OF PRESENT ILLNESS:  Vicki Stein is a 77 year old white female with a  history of a stroke who presents today with 5 to 10 minutes of left hand and  arm clumsiness and paresthesias.  Also, the right side of her tongue felt  numb and her family member noticed that her right face was drooping a bit.  She also had a period of confusion which lasted a bit longer where she could  not recall her granddaughter's name.  Currently, all of these symptoms are  resolved.  She had a headache which felt like a fullness at the back of her  head today, and had taken a Darvocet.  She takes an aspirin a day, and  according to [facility] had no carotid disease when she was admitted for stroke in  2000.   PAST MEDICAL HISTORY:  1. Cerebrovascular infarct without any residual neurologic deficit.  2. Hypertension.  3. Hyperlipidemia.  4. She has recently had worsening of her depression and is waiting for her     Prozac pills to come in.  5. Anxiety.  6. Allergies.  7. Osteoarthritis.   MEDICATIONS:  1. Lisinopril/hydrochlorothiazide 10/12.5 mg p.o. q. day.  2. Alprazolam 0.5 mg p.o. q.h.s. p.r.n. sleep.  3. Patanol eye drops b.i.d.  4. Robaxin 500 mg p.o. t.i.d. as needed.  5. She has not yet started Prozac, but plans to do so when her pills come in     the mail.  6. Reglan 10 mg p.o. q.a.c. and h.s.  7. Nexium 20 mg p.o. q. day.  8. Zocor 20 mg p.o. q. day.  9. Allegra 180 mg p.o. q. day.  10.      Magnesium sulfate.  11.      Calcium chloride.  12.      Flonase.  13.      Darvocet.  14.      Aspirin 325 mg p.o. q. day.   ALLERGIES:  1. PENICILLIN.  2. ULTRAM.  3. She has an intolerance to the NON-ENTERIC COATED ASPIRIN, but has no     problems with enteric coated aspirin.   SOCIAL HISTORY:  The patient does not smoke or drink.  She is widowed and  lives alone.   FAMILY HISTORY:  Noncontributory.   REVIEW OF SYSTEMS:  CONSTITUTIONAL:  She has lost about 15 pounds since she  has been so depressed recently.  HEENT:  As above.  RESPIRATORY:  No cough,  shortness of breath.  CARDIOVASCULAR:  No chest pains or palpitations.  GASTROINTESTINAL:  No history of bleeding ulcers.  She has had EGD done  showing gastroesophageal reflux disease and delayed gastric emptying.  GENITOURINARY:  No dysuria.  MUSCULOSKELETAL:  As above.  PSYCHIATRIC:  As  above.  NEUROLOGIC:  As above.  SKIN:  No rash.  ENDOCRINE:  No diabetes.   PHYSICAL EXAMINATION:  VITAL SIGNS:  Her temperature is 98.2, blood pressure  136/62, pulse 83, respiratory rate 20, pulse oximetry 96% on room air.  GENERAL:  The patient is well-developed, well-nourished, in no acute  distress.  HEENT:  Normocephalic, atraumatic.  Pupils equal, round, reactive to light.  Sclerae nonicteric.  Extraocular movements are intact.  No facial drooping.  Tongue is midline.  Mucous membranes are moist.  NECK:  Supple, no lymphadenopathy, no thyromegaly, no carotid bruits.  LUNGS:  Clear to auscultation bilaterally without wheezes, rhonchi, or  rales.  CARDIOVASCULAR:  Regular rate and rhythm without murmurs, rubs, or gallops.  ABDOMEN:  Normal bowel sounds, soft, nontender, nondistended.  GENITOURINARY:  Deferred.  RECTAL:  Deferred.  EXTREMITIES:  No cyanosis, clubbing, or edema.  NEUROLOGIC:  The patient is alert and oriented x3.  Cranial nerves are  intact.  Motor strength is 5/5.  Finger-to-nose normal.  Gait was not  tested.  Deep tendon reflexes normal.  Babinski negative.  SKIN:  No rash.    LABORATORY DATA:  Her basic metabolic panel is significant for a potassium  of 3.3, otherwise unremarkable.  Her CBC and coag's are normal.  EKG shows  normal sinus rhythm.  CT scan of the brain shows possible acute left  anterior limb of the internal capsule infarct.   ASSESSMENT AND PLAN:  1. Transient ischemic attack.  The patient will be admitted to telemetry for     neuro checks for 24 hours.  I will check lipids and carotid Dopplers.     She is already on an aspirin a day, and I will therefore change this to     Aggrenox.  If she has no recurrence of her symptoms and her carotid     Doppler shows no critical stenosis, consider early discharge.  2. Hyperlipidemia.  3. Depression.  4. Anxiety.  5. Gastroesophageal reflux disease with delayed gastric emptying.  6. Hypertension.  7. History of stroke in the past.  8. Hypokalemia.  This will be repleted p.o.                                                Corinna L. Lendell Caprice, MD    CLS/MEDQ  D:  06/27/2003  T:  06/27/2003  Job:  [id number]   cc:   Molly Maduro L. Foy Guadalajara, M.D.  [address] [place]  [place] [address]  Fax: [phone]

## 2010-11-23 NOTE — Op Note (Signed)
   NAME:  Vicki Stein, Vicki Stein                         ACCOUNT NO.:  [id number]   MEDICAL RECORD NO.:  [id number]                   PATIENT TYPE:  AMB   LOCATION:  ENDO                                 FACILITY:  [facility]   PHYSICIAN:  James L. Malon Kindle., M.D.          DATE OF BIRTH:  July 29, 1927   DATE OF PROCEDURE:  03/15/2003  DATE OF DISCHARGE:                                 OPERATIVE REPORT   PROCEDURE:  Esophagogastroduodenoscopy.   PREMEDICATION:  Fentanyl  50 mcg, Versed 7 mg IV.   INDICATIONS:  Persistent chronic cough, esophageal reflux symptoms.   DESCRIPTION OF PROCEDURE:  The procedure has been explained to the patient  and consent has been obtained.  The patient has been placed in the left  lateral decubitus position and Olympus endoscope inserted and advanced down  to the stomach, pumped air into the stomach.  The patient seemed to have a  large quantity of retained food material obscuring the greater curve,  despite an overnight fast.  The gastric outlet was widely patent.  We passed  into the second duodenum and this appeared to be completely normal.  There  was no ulceration, inflammation, nor was there any evidence of bowel  obstruction in the second duodenum or duodenal bulb.  The pyloric channel  was normal.  Antrum and body were endoscopically normal.  Again the greater  curve of the body of the stomach was obscured by retained food.  The scope  was withdrawn.  The distal esophagus was seen well.  The fundus and cardia  of the stomach __________ appeared normal.  There was a small hiatal hernia  with no ulcerations inflammation of the distal esophagus.  The proximal  esophagus was normal.  The scope was withdrawn.  The patient tolerated the  procedure well.   ASSESSMENT:  1. Esophageal reflux, probably exacerbated by gastroparesis.  2. Gastroparesis suggested by retained food material despite overnight fast.    PLAN:  We will go ahead and give the patient a trial  of Reglan.  Continue on  the b.i.d. Nexium.  Give antireflux instructions and see back in the office  in three to four weeks.                                                James L. Malon Kindle., M.D.    Waldron Session  D:  03/15/2003  T:  03/15/2003  Job:  [id number]   cc:   Molly Maduro L. Foy Guadalajara, M.D.  [address] [place]  [place] [address]  Fax: [phone]   Charlaine Dalton. Sherene Sires, M.D. [facility]   Jefry H. Pollyann Kennedy, M.D.  [address] [place]  [place] [address]  Fax: [phone]

## 2010-11-23 NOTE — Op Note (Signed)
NAMEEEVA, SCHLOSSER               ACCOUNT NO.:  [id number]   MEDICAL RECORD NO.:  [id number]          PATIENT TYPE:  AMB   LOCATION:  ENDO                         FACILITY:  [facility]   PHYSICIAN:  James L. Malon Kindle., M.D.DATE OF BIRTH:  02-Jan-1928   DATE OF PROCEDURE:  05/20/2005  DATE OF DISCHARGE:                                 OPERATIVE REPORT   PROCEDURE:  Colonoscopy.   MEDICATIONS GIVEN:  Fentanyl 100 mcg, Versed 10 mg IV.   INDICATIONS FOR PROCEDURE:  Rectal bleeding, heme positive stool.   DESCRIPTION OF PROCEDURE:  The procedure had been explained to the patient  and consent obtained.  In the left lateral decubitus position, the Olympus  pediatric adjustable scope was inserted and advanced.  The prep was  adequate.  We were able to reach the cecum without difficulty.  The  ileocecal valve and appendiceal orifice were seen.  The scope was withdrawn  and the colon carefully examined.  The cecum, ascending colon, transverse  colon, descending and sigmoid colon were seen well with a few scattered  diverticula in the sigmoid, no polyps seen throughout, normal mucosal  pattern. The rectum was free of polyps.  There were moderate size internal  hemorrhoids in the rectum.  The scope was withdrawn.  The patient tolerated  the procedure well.   ASSESSMENT:  1.  Heme positive stools, rectal bleeding, 578.1.  2.  Scattered diverticula, 562.1.  3.  Internal hemorrhoids, 455.0.   PLAN:  Will give a hemorrhoid instruction sheet, see back in the office in  approximately 6-8 weeks.           ______________________________  Llana Aliment Malon Kindle., M.D.     Waldron Session  D:  05/20/2005  T:  05/20/2005  Job:  [id number]   cc:   Molly Maduro L. Foy Guadalajara, M.D.  Fax: [phone]

## 2011-04-03 LAB — BASIC METABOLIC PANEL
BUN: 16
CO2: 23
Calcium: 9.6
Chloride: 109
Creatinine, Ser: 1.13
GFR calc Af Amer: 56 — ABNORMAL LOW
GFR calc non Af Amer: 47 — ABNORMAL LOW
Glucose, Bld: 103 — ABNORMAL HIGH
Potassium: 3.9
Sodium: 143

## 2011-04-03 LAB — URINALYSIS, ROUTINE W REFLEX MICROSCOPIC
Bilirubin Urine: NEGATIVE
Glucose, UA: NEGATIVE
Ketones, ur: NEGATIVE
Leukocytes, UA: NEGATIVE
Nitrite: NEGATIVE
Protein, ur: NEGATIVE
Specific Gravity, Urine: 1.008
Urobilinogen, UA: 0.2
pH: 6.5

## 2011-04-03 LAB — URINE MICROSCOPIC-ADD ON

## 2011-04-03 LAB — LIPASE, BLOOD: Lipase: 29

## 2011-04-03 LAB — DIFFERENTIAL
Basophils Absolute: 0
Basophils Relative: 0
Eosinophils Absolute: 0.1
Eosinophils Relative: 2
Lymphocytes Relative: 17
Lymphs Abs: 1.3
Monocytes Absolute: 0.7
Monocytes Relative: 9
Neutro Abs: 5.5
Neutrophils Relative %: 72

## 2011-04-03 LAB — CBC
HCT: 39.7
Hemoglobin: 13.6
MCHC: 34.1
MCV: 89.1
Platelets: 216
RBC: 4.46
RDW: 13.3
WBC: 7.6

## 2011-06-18 ENCOUNTER — Other Ambulatory Visit: Payer: Self-pay | Admitting: Family Medicine

## 2011-06-18 DIAGNOSIS — Z1231 Encounter for screening mammogram for malignant neoplasm of breast: Secondary | ICD-10-CM

## 2011-07-07 ENCOUNTER — Emergency Department (INDEPENDENT_AMBULATORY_CARE_PROVIDER_SITE_OTHER)
Admission: EM | Admit: 2011-07-07 | Discharge: 2011-07-07 | Disposition: A | Discharge status: Home / Self Care | Payer: Medicare Other | Source: Home / Self Care | Attending: Emergency Medicine | Admitting: Emergency Medicine

## 2011-07-07 ENCOUNTER — Emergency Department
Admit: 2011-07-07 | Discharge: 2011-07-07 | Disposition: A | Discharge status: Home / Self Care | Payer: PRIVATE HEALTH INSURANCE

## 2011-07-07 DIAGNOSIS — R05 Cough: Secondary | ICD-10-CM

## 2011-07-07 DIAGNOSIS — S61209A Unspecified open wound of unspecified finger without damage to nail, initial encounter: Secondary | ICD-10-CM

## 2011-07-07 DIAGNOSIS — R059 Cough, unspecified: Secondary | ICD-10-CM

## 2011-07-07 DIAGNOSIS — J189 Pneumonia, unspecified organism: Secondary | ICD-10-CM

## 2011-07-07 DIAGNOSIS — J209 Acute bronchitis, unspecified: Secondary | ICD-10-CM

## 2011-07-07 HISTORY — DX: Hyperlipidemia, unspecified: E78.5

## 2011-07-07 HISTORY — DX: Essential (primary) hypertension: I10

## 2011-07-07 MED ORDER — AZITHROMYCIN 250 MG PO TABS: ORAL_TABLET | ORAL | Status: AC

## 2011-07-07 MED ORDER — HYDROCOD POLST-CHLORPHEN POLST 10-8 MG/5ML PO LQCR: 5.0000 mL | Freq: Two times a day (BID) | ORAL | Status: DC | PRN

## 2011-07-07 MED ORDER — TETANUS-DIPHTH-ACELL PERTUSSIS 5-2.5-18.5 LF-MCG/0.5 IM SUSP
0.5000 mL | Freq: Once | INTRAMUSCULAR | Status: AC
  Administered 2011-07-07: 0.5 mL via INTRAMUSCULAR

## 2011-07-07 NOTE — ED Provider Notes (Signed)
History     CSN: [id number]  Arrival date & time 07/07/11  1436   First MD Initiated Contact with Patient 07/07/11 1533      Chief Complaint  Patient presents with  . Cough    (Consider location/radiation/quality/duration/timing/severity/associated sxs/prior treatment) HPI 1) Savhanna is a 77 y.o. female who complains of onset of cold symptoms for 2 weeks. She has a history of allergies. She states that she gets bronchitis about twice a year and her doctor normally does for Z-Pak which makes her feel better.  No sore throat + cough No pleuritic pain No wheezing + nasal congestion + post-nasal drainage No sinus pain/pressure + chest congestion No itchy/red eyes No earache No hemoptysis + SOB No chills/sweats No fever No nausea No vomiting No abdominal pain No diarrhea No skin rashes No fatigue No myalgias No headache   2) she is also here because she cut her left pinky finger about 4 or 5 days ago. She had a new set of knives that she cut it with. It was opened for a few days and then she eventually put some super glue on it which has helped to close up. She has mild tenderness but would like someone to look at.      Past Medical History  Diagnosis Date  . Hypertension   . Hyperlipemia     Past Surgical History  Procedure Date  . Cholecystectomy   . Rotator cuff repair     History reviewed. No pertinent family history.  History  Substance Use Topics  . Smoking status: Never Smoker   . Smokeless tobacco: Not on file  . Alcohol Use: No    OB History    Grav Para Term Preterm Abortions TAB SAB Ect Mult Living                  Review of Systems  Allergies  Penicillins  Home Medications   Current Outpatient Rx  Name Route Sig Dispense Refill  . ALPRAZOLAM 0.5 MG PO TABS Oral Take 0.5 mg by mouth at bedtime as needed.      . ATORVASTATIN CALCIUM 40 MG PO TABS Oral Take 40 mg by mouth daily.      Marland Kitchen DICLOFENAC SODIUM 1 % TD GEL Topical Apply  topically.      . ASPIRIN-DIPYRIDAMOLE 25-200 MG PO CP12 Oral Take 1 capsule by mouth 2 (two) times daily.      Marland Kitchen LISINOPRIL 40 MG PO TABS Oral Take 40 mg by mouth daily.      Marland Kitchen METHOCARBAMOL 500 MG PO TABS Oral Take 500 mg by mouth 4 (four) times daily.      Marland Kitchen OMEPRAZOLE 20 MG PO CPDR Oral Take 20 mg by mouth daily.      . AZITHROMYCIN 250 MG PO TABS  Use as directed 1 each 0  . HYDROCOD POLST-CHLORPHEN POLST 10-8 MG/5ML PO LQCR Oral Take 5 mLs by mouth every 12 (twelve) hours as needed. 90 mL 0    There were no vitals taken for this visit.  Physical Exam  HENT:  Right Ear: Tympanic membrane, external ear and ear canal normal.  Left Ear: Tympanic membrane, external ear and ear canal normal.  Nose: Rhinorrhea present. Right sinus exhibits no maxillary sinus tenderness. Left sinus exhibits no maxillary sinus tenderness.  Mouth/Throat: No oropharyngeal exudate, posterior oropharyngeal edema or posterior oropharyngeal erythema.  Neck: Neck supple.  Pulmonary/Chest: Effort normal and breath sounds normal. No respiratory distress. She has no wheezes. She  has no rhonchi. She has no rales.  Musculoskeletal:       Left pinky finger on the volar aspect has a 1 cm wound that has closed up. It is no longer open. There are no signs of active infection. She has full range of motion of all joints in that finger. No swelling or bruising.    ED Course  Procedures (including critical care time)  Labs Reviewed - No data to display Dg Chest 2 View  07/07/2011  *RADIOLOGY REPORT*  Clinical Data: Shortness of breath for 1 month.  Bronchitis. Question pneumonia.  CHEST - 2 VIEW  Comparison: 01/12/2010 and 03/13/2009 radiographs.  Findings: The heart size and mediastinal contours are stable. There is stable mild asymmetric biapical thickening.  The lungs are less clear.  There is no pleural effusion.  Diffuse osteophytes of the thoracic spine appear stable.  There are probable postsurgical changes consistent  with distal clavicle resection bilaterally.  IMPRESSION: Stable examination.  No active cardiopulmonary process.  Original Report Authenticated By: Gerrianne Scale, M.D.     1. Acute bronchitis   2. Cough   3. Finger wound, simple, open       MDM  1) Take the prescribed antibiotic as instructed. A chest x-ray was obtained and read by radiology as above. I have given her prescriptions for both a Z-Pak and cough medicine. Use nasal saline solution (over the counter) at least 3 times a day. Can take tylenol every 6 hours or motrin every 8 hours for pain or fever. Follow up with your primary doctor if no improvement in 5-7 days, sooner if increasing pain, fever, or new symptoms.    2) for the finger wound she is already treated very well with the super glue. I'm also going to place her in a finger splint for a few days to make sure that it heals up properly. We are also going to give her a tetanus shot since she reports not having one for 10-15 years. I do not feel that she needs antibiotics at this time. If she is not improving or she expresses any worsening, she should followup with her primary care Dr.     Lily Kocher, MD 07/07/11 [phone]

## 2011-07-11 ENCOUNTER — Ambulatory Visit
Admission: RE | Admit: 2011-07-11 | Discharge: 2011-07-11 | Disposition: A | Discharge status: Home / Self Care | Payer: PRIVATE HEALTH INSURANCE | Source: Ambulatory Visit | Attending: Family Medicine | Admitting: Family Medicine

## 2011-07-11 DIAGNOSIS — Z1231 Encounter for screening mammogram for malignant neoplasm of breast: Secondary | ICD-10-CM | POA: Diagnosis not present

## 2011-08-05 DIAGNOSIS — R059 Cough, unspecified: Secondary | ICD-10-CM | POA: Diagnosis not present

## 2011-08-05 DIAGNOSIS — G459 Transient cerebral ischemic attack, unspecified: Secondary | ICD-10-CM | POA: Diagnosis not present

## 2011-08-05 DIAGNOSIS — E7800 Pure hypercholesterolemia, unspecified: Secondary | ICD-10-CM | POA: Diagnosis not present

## 2011-08-05 DIAGNOSIS — R05 Cough: Secondary | ICD-10-CM | POA: Diagnosis not present

## 2011-08-05 DIAGNOSIS — G114 Hereditary spastic paraplegia: Secondary | ICD-10-CM | POA: Diagnosis not present

## 2011-08-05 DIAGNOSIS — I10 Essential (primary) hypertension: Secondary | ICD-10-CM | POA: Diagnosis not present

## 2011-08-05 DIAGNOSIS — R32 Unspecified urinary incontinence: Secondary | ICD-10-CM | POA: Diagnosis not present

## 2011-10-24 DIAGNOSIS — L570 Actinic keratosis: Secondary | ICD-10-CM | POA: Diagnosis not present

## 2011-11-27 DIAGNOSIS — Z85828 Personal history of other malignant neoplasm of skin: Secondary | ICD-10-CM | POA: Diagnosis not present

## 2011-11-27 DIAGNOSIS — D0460 Carcinoma in situ of skin of unspecified upper limb, including shoulder: Secondary | ICD-10-CM | POA: Diagnosis not present

## 2012-01-06 DIAGNOSIS — N644 Mastodynia: Secondary | ICD-10-CM | POA: Diagnosis not present

## 2012-01-07 ENCOUNTER — Other Ambulatory Visit: Payer: Self-pay | Admitting: Family Medicine

## 2012-01-07 DIAGNOSIS — N644 Mastodynia: Secondary | ICD-10-CM

## 2012-01-07 DIAGNOSIS — N63 Unspecified lump in unspecified breast: Secondary | ICD-10-CM

## 2012-01-13 ENCOUNTER — Ambulatory Visit
Admission: RE | Admit: 2012-01-13 | Discharge: 2012-01-13 | Disposition: A | Discharge status: Home / Self Care | Payer: Medicare Other | Source: Ambulatory Visit | Attending: Family Medicine | Admitting: Family Medicine

## 2012-01-13 DIAGNOSIS — N644 Mastodynia: Secondary | ICD-10-CM

## 2012-01-13 DIAGNOSIS — Z888 Allergy status to other drugs, medicaments and biological substances status: Secondary | ICD-10-CM | POA: Diagnosis not present

## 2012-01-13 DIAGNOSIS — Z880 Allergy status to penicillin: Secondary | ICD-10-CM | POA: Diagnosis not present

## 2012-01-13 DIAGNOSIS — T7840XA Allergy, unspecified, initial encounter: Secondary | ICD-10-CM | POA: Diagnosis not present

## 2012-01-13 DIAGNOSIS — N63 Unspecified lump in unspecified breast: Secondary | ICD-10-CM

## 2012-01-13 DIAGNOSIS — L500 Allergic urticaria: Secondary | ICD-10-CM | POA: Diagnosis not present

## 2012-02-04 DIAGNOSIS — J209 Acute bronchitis, unspecified: Secondary | ICD-10-CM | POA: Diagnosis not present

## 2012-02-18 DIAGNOSIS — J209 Acute bronchitis, unspecified: Secondary | ICD-10-CM | POA: Diagnosis not present

## 2012-02-18 DIAGNOSIS — R110 Nausea: Secondary | ICD-10-CM | POA: Diagnosis not present

## 2012-03-12 DIAGNOSIS — R002 Palpitations: Secondary | ICD-10-CM | POA: Diagnosis not present

## 2012-03-12 DIAGNOSIS — Z23 Encounter for immunization: Secondary | ICD-10-CM | POA: Diagnosis not present

## 2012-03-12 DIAGNOSIS — I10 Essential (primary) hypertension: Secondary | ICD-10-CM | POA: Diagnosis not present

## 2012-03-12 DIAGNOSIS — R059 Cough, unspecified: Secondary | ICD-10-CM | POA: Diagnosis not present

## 2012-03-12 DIAGNOSIS — R05 Cough: Secondary | ICD-10-CM | POA: Diagnosis not present

## 2012-04-20 DIAGNOSIS — R05 Cough: Secondary | ICD-10-CM | POA: Diagnosis not present

## 2012-04-20 DIAGNOSIS — I10 Essential (primary) hypertension: Secondary | ICD-10-CM | POA: Diagnosis not present

## 2012-04-20 DIAGNOSIS — R059 Cough, unspecified: Secondary | ICD-10-CM | POA: Diagnosis not present

## 2012-05-20 DIAGNOSIS — L821 Other seborrheic keratosis: Secondary | ICD-10-CM | POA: Diagnosis not present

## 2012-06-12 DIAGNOSIS — I10 Essential (primary) hypertension: Secondary | ICD-10-CM | POA: Diagnosis not present

## 2012-06-12 DIAGNOSIS — M109 Gout, unspecified: Secondary | ICD-10-CM | POA: Diagnosis not present

## 2012-06-12 DIAGNOSIS — E7800 Pure hypercholesterolemia, unspecified: Secondary | ICD-10-CM | POA: Diagnosis not present

## 2012-06-17 ENCOUNTER — Other Ambulatory Visit: Payer: Self-pay | Admitting: Family Medicine

## 2012-06-17 DIAGNOSIS — Z1231 Encounter for screening mammogram for malignant neoplasm of breast: Secondary | ICD-10-CM

## 2012-07-27 ENCOUNTER — Ambulatory Visit
Admission: RE | Admit: 2012-07-27 | Discharge: 2012-07-27 | Disposition: A | Discharge status: Home / Self Care | Payer: Medicare Other | Source: Ambulatory Visit | Attending: Family Medicine | Admitting: Family Medicine

## 2012-07-27 DIAGNOSIS — Z1231 Encounter for screening mammogram for malignant neoplasm of breast: Secondary | ICD-10-CM | POA: Diagnosis not present

## 2012-11-19 DIAGNOSIS — E7800 Pure hypercholesterolemia, unspecified: Secondary | ICD-10-CM | POA: Diagnosis not present

## 2012-11-19 DIAGNOSIS — M6530 Trigger finger, unspecified finger: Secondary | ICD-10-CM | POA: Diagnosis not present

## 2012-11-19 DIAGNOSIS — R609 Edema, unspecified: Secondary | ICD-10-CM | POA: Diagnosis not present

## 2012-11-19 DIAGNOSIS — I10 Essential (primary) hypertension: Secondary | ICD-10-CM | POA: Diagnosis not present

## 2012-11-25 ENCOUNTER — Ambulatory Visit (INDEPENDENT_AMBULATORY_CARE_PROVIDER_SITE_OTHER): Payer: Medicare Other | Admitting: Neurology

## 2012-11-25 ENCOUNTER — Encounter: Payer: Self-pay | Admitting: Neurology

## 2012-11-25 VITALS — BP 122/68 | HR 84 | Ht 63.0 in | Wt 161.0 lb

## 2012-11-25 DIAGNOSIS — G114 Hereditary spastic paraplegia: Secondary | ICD-10-CM

## 2012-11-25 DIAGNOSIS — M109 Gout, unspecified: Secondary | ICD-10-CM

## 2012-11-25 DIAGNOSIS — M545 Low back pain: Secondary | ICD-10-CM

## 2012-11-25 DIAGNOSIS — IMO0002 Reserved for concepts with insufficient information to code with codable children: Secondary | ICD-10-CM

## 2012-11-25 DIAGNOSIS — G8389 Other specified paralytic syndromes: Secondary | ICD-10-CM | POA: Diagnosis not present

## 2012-11-25 DIAGNOSIS — M10362 Gout due to renal impairment, left knee: Secondary | ICD-10-CM

## 2012-11-25 DIAGNOSIS — N289 Disorder of kidney and ureter, unspecified: Secondary | ICD-10-CM

## 2012-11-25 NOTE — Patient Instructions (Addendum)
Hereditary Spastic Paraparesis

## 2012-11-25 NOTE — Progress Notes (Signed)
[facility]  Provider:  Dr Savonna Birchmeier Referring Provider: Lenora Boys, MD Primary Care Physician:  Lenora Boys, MD  Chief Complaint  Patient presents with  . Back Pain    New Patient    HPI:  Vicki Stein is a 79 y.o. female here as a referral from Dr. Foy Guadalajara for evaluation of back pain, she had seen me and Dr Anne Hahn , 206.   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  The patient has multiple family members affected with this gait disorder, and she has been using an Art gallery manager , a 3 wheeler , which  was  A good success. Still, she has noticed more frequent falls  Just over the last 8 month , and severe lower back pain. She has reached a point were for example to put on shoes she has was bolused hands to hold her foot bring it up to her lab and in response to this develops low back pain and radiating pain into the flank and lower abdomen as well as into the thigh I suspect that she has quite significant degenerative joint disease at the lower back now. I would like for this patient to be followed by a neuromuscular specialist and rehab- specialist. This is a problem that exceeds the scope of my practice.    Review of Systems: Out of a complete 14 system review, the patient complains of only the following symptoms, and all other reviewed systems are negative.   History   Social History  . Marital Status: Widowed    Spouse Name: N/A    Number of Children: 3  . Years of Education: 10th   Occupational History  . retired    Social History Main Topics  . Smoking status: Never Smoker   . Smokeless tobacco: Never Used  . Alcohol Use: No  . Drug Use: No  . Sexually Active: Not on file   Other Topics Concern  . Not on file   Social History Narrative   Patient lives is a widow. Patient is retired. Patient has 10th grade education. Right handed.    Family History  Problem Relation  Age of Onset  . Heart Problems Mother   . Heart Problems Father     Past Medical History  Diagnosis Date  . Hypertension   . Hyperlipemia   . High cholesterol   . Back pain   . Migraines   . Cerebrovascular disease   . Gout   . Reflux     Past Surgical History  Procedure Laterality Date  . Cholecystectomy    . Rotator cuff repair      Current Outpatient Prescriptions  Medication Sig Dispense Refill  . ALPRAZolam (XANAX) 0.5 MG tablet Take 0.5 mg by mouth at bedtime as needed.        Marland Kitchen atorvastatin (LIPITOR) 40 MG tablet Take 40 mg by mouth daily.        . chlorpheniramine-HYDROcodone (TUSSIONEX PENNKINETIC ER) 10-8 MG/5ML LQCR Take 5 mLs by mouth every 12 (twelve) hours as needed.  90 mL  0  . diclofenac sodium (VOLTAREN) 1 % GEL Apply topically.        . dipyridamole-aspirin (AGGRENOX) 25-200 MG per 12 hr capsule Take 1 capsule by mouth 2 (two) times daily.        . hydrochlorothiazide (HYDRODIURIL) 25 MG tablet 25 mg daily.      Marland Kitchen HYDROcodone-acetaminophen (NORCO) 10-325 MG per tablet 10-325  tablets as directed. Dr . Foy Guadalajara ,  [facility] .      . lisinopril (PRINIVIL,ZESTRIL) 40 MG tablet Take 40 mg by mouth daily.        Marland Kitchen omeprazole (PRILOSEC) 20 MG capsule Take 20 mg by mouth daily.        . methocarbamol (ROBAXIN) 500 MG tablet Take 500 mg by mouth 4 (four) times daily.         No current facility-administered medications for this visit.    Allergies as of 11/25/2012 - Review Complete 11/25/2012  Allergen Reaction Noted  . Aleve (naproxen sodium)  11/25/2012  . Bactrim (sulfamethoxazole w-trimethoprim)  11/25/2012  . Motrin (ibuprofen)  11/25/2012  . Penicillins  07/07/2011  . Ultram (tramadol)  11/25/2012    Vitals: BP 122/68  Pulse 84  Ht 5\' 3"  (1.6 m)  Wt 161 lb (73.029 kg)  BMI 28.53 kg/m2 Last Weight:  Wt Readings from Last 1 Encounters:  11/25/12 161 lb (73.029 kg)   Last Height:   Ht Readings from Last 1 Encounters:  11/25/12 5\' 3"  (1.6 m)    Vision Screening:  See vitals . Vicki Stein has last been seen in my office by Dr. Lesia Sago on 10/03/2006 and is now sent here for evaluation of back pain. Patient is a right-handed Caucasian female with a history of hereditary spastic rapid paraparesis. She has multiple family members with gait disorder over the years she had graft and gradually worsened in strength and increased spasticity where she now needs assistance to raise from a chair and she can barely walk with a walker.  She also states that she had folds also has an additional problem  With gout and polyarthritis in the left foot and ankle. Her main problem today is her back pain for which Dr. Foy Guadalajara as provided her of his pain medication she states that her lower back bothers her so much that even touching her lower back of palpating it sense a severe pain response. Her past medical history is significant for a for a migraine-hypertension-bilateral rotator cuff tear repairs in both shoulders, cholecystectomy, hyperlipidemia, gout and arthritis, tonsillectomy and bilateral tubal ligation.  The patient is widowed and has 2 adult sons both his hereditary spastic paresis.     General: The patient is awake, alert and appears not in acute distress. The patient is well groomed. Head: Normocephalic, atraumatic. Neck is supple. Mallampati 2 , neck circumference:14.5 Cardiovascular:  Regular rate and rhythm, without  murmurs or carotid bruit, and without distended neck veins. Respiratory: Lungs are clear to auscultation. Skin:  Without evidence of edema, or rash Trunk: BMI is elevated and patient  has normal posture.  Neurologic exam : The patient is awake and alert, oriented to place and time.  Memory subjective  described as intact. There is a normal attention span & concentration ability. Speech is fluent without  dysarthria, dysphonia or aphasia. Mood and affect are appropriate.  Cranial nerves: Pupils are equal and briskly reactive  to light. Funduscopic exam without  evidence of pallor or edema. Extraocular movements  in vertical and horizontal planes intact and without nystagmus. Visual fields by finger perimetry are intact. Hearing to finger rub intact.  Facial sensation intact to fine touch. Facial motor strength is symmetric and tongue and uvula move midline.  Motor exam:    There's a very high muscle tone over the quadriceps as well as of the hamstring musculature passive extension of the leg at the knee is difficult the  patient also has problems with the hip flexion and extension and there is movement at the ankle bilaterally left more than right. The increased on corresponds  Here with a  normal muscle bulk in both lower extremities.    Sensory:  Fine touch, pinprick and vibration were tested in all extremities. Proprioception is tested in the upper extremities only. The patient reports once in a while pain and dysesthesias in both feet and toes.  normal.  Coordination: Rapid alternating movements in the fingers/hands is tested and normal. Finger-to-nose maneuver tested and normal without evidence of ataxia, dysmetria or tremor. Her legs can barely be flexed, the patient used both hands to pull her foot to the opposite knee and put her shoes on. This causes worsening of baCK PAIN.   Gait and station: Patient walks with assistive device, she is using a tricycle scooter to move longer distances but transfer from a seated position his already very difficult for her and she needsa left chair to get up.    Deep tendon reflexes: in the  upper and lower extremities are symmetric and  Show brisk responses, no clonus. . Babinski maneuver response is upgoing bilaterally. .   Assessment:  After physical and neurologic examination, review of laboratory studies, imaging, neurophysiology testing and pre-existing records, assessment will be reviewed on the problem list.  Plan:  Treatment plan and additional workup will be reviewed  under Problem List.    Patient should be followed by a neuromuscular specialist and  With a rehabilitation facility. #1 hereditary spastic paraparesis, requiring a lift chair at home and motorized scooter or wheelchair. She needs a new three wheel  scooter  quickly.  #2 spasticity may be reduced but that passive range of motion exercise, I would like for her to have either a physical therapist visit her off or have to have several visits with a physical therapist but can initiate a passive range of motion program to prevent further spasticity. Stretching exercises.  As to her back pain I think that narcotic pain medication is not a long-term solution for the patient. I have nor computerized records of her imaging studies, casts from 8 years ago. Her were asked to have these retrieved and scanned into the computer's media. Unconvinced that her exacerbation of lower back pain no is related to spondylolisthesis or a disc in her back at the lower thoracic and possible  L4-L5 level.

## 2012-12-04 ENCOUNTER — Ambulatory Visit
Admission: RE | Admit: 2012-12-04 | Discharge: 2012-12-04 | Disposition: A | Discharge status: Home / Self Care | Payer: Medicare Other | Source: Ambulatory Visit | Attending: Neurology | Admitting: Neurology

## 2012-12-04 DIAGNOSIS — G114 Hereditary spastic paraplegia: Secondary | ICD-10-CM

## 2012-12-04 DIAGNOSIS — M549 Dorsalgia, unspecified: Secondary | ICD-10-CM | POA: Diagnosis not present

## 2012-12-04 DIAGNOSIS — R269 Unspecified abnormalities of gait and mobility: Secondary | ICD-10-CM

## 2012-12-04 DIAGNOSIS — M10362 Gout due to renal impairment, left knee: Secondary | ICD-10-CM

## 2012-12-04 DIAGNOSIS — G8220 Paraplegia, unspecified: Secondary | ICD-10-CM | POA: Diagnosis not present

## 2012-12-04 DIAGNOSIS — M546 Pain in thoracic spine: Secondary | ICD-10-CM

## 2012-12-04 DIAGNOSIS — M545 Low back pain: Secondary | ICD-10-CM

## 2012-12-04 MED ORDER — GADOBENATE DIMEGLUMINE 529 MG/ML IV SOLN
15.0000 mL | Freq: Once | INTRAVENOUS | Status: AC | PRN
  Administered 2012-12-04: 15 mL via INTRAVENOUS

## 2012-12-08 ENCOUNTER — Encounter: Payer: Self-pay | Admitting: Neurology

## 2012-12-14 NOTE — Progress Notes (Signed)
Quick Note:  I spoke to patient and relayed MRI lumbar results, per Dr. Pearlean Brownie. Patient will discuss more at follow up appointment in July. ______

## 2012-12-16 DIAGNOSIS — I9589 Other hypotension: Secondary | ICD-10-CM | POA: Diagnosis not present

## 2012-12-16 DIAGNOSIS — Q619 Cystic kidney disease, unspecified: Secondary | ICD-10-CM | POA: Diagnosis not present

## 2013-01-15 ENCOUNTER — Ambulatory Visit (INDEPENDENT_AMBULATORY_CARE_PROVIDER_SITE_OTHER): Payer: Medicare Other | Admitting: Neurology

## 2013-01-15 ENCOUNTER — Encounter: Payer: Self-pay | Admitting: Neurology

## 2013-01-15 VITALS — BP 147/76 | HR 67 | Resp 18 | Wt 168.0 lb

## 2013-01-15 DIAGNOSIS — G114 Hereditary spastic paraplegia: Secondary | ICD-10-CM | POA: Insufficient documentation

## 2013-01-15 MED ORDER — BACLOFEN 10 MG PO TABS: 10.0000 mg | ORAL_TABLET | Freq: Two times a day (BID) | ORAL | Status: DC

## 2013-01-15 NOTE — Patient Instructions (Signed)
Fall Prevention and Home Safety Falls cause injuries and can affect all age groups. It is possible to use preventive measures to significantly decrease the likelihood of falls. There are many simple measures which can make your home safer and prevent falls. OUTDOORS  Repair cracks and edges of walkways and driveways.  Remove high doorway thresholds.  Trim shrubbery on the main path into your home.  Have good outside lighting.  Clear walkways of tools, rocks, debris, and clutter.  Check that handrails are not broken and are securely fastened. Both sides of steps should have handrails.  Have leaves, snow, and ice cleared regularly.  Use sand or salt on walkways during winter months.  In the garage, clean up grease or oil spills. BATHROOM  Install night lights.  Install grab bars by the toilet and in the tub and shower.  Use non-skid mats or decals in the tub or shower.  Place a plastic non-slip stool in the shower to sit on, if needed.  Keep floors dry and clean up all water on the floor immediately.  Remove soap buildup in the tub or shower on a regular basis.  Secure bath mats with non-slip, double-sided rug tape.  Remove throw rugs and tripping hazards from the floors. BEDROOMS  Install night lights.  Make sure a bedside light is easy to reach.  Do not use oversized bedding.  Keep a telephone by your bedside.  Have a firm chair with side arms to use for getting dressed.  Remove throw rugs and tripping hazards from the floor. KITCHEN  Keep handles on pots and pans turned toward the center of the stove. Use back burners when possible.  Clean up spills quickly and allow time for drying.  Avoid walking on wet floors.  Avoid hot utensils and knives.  Position shelves so they are not too high or low.  Place commonly used objects within easy reach.  If necessary, use a sturdy step stool with a grab bar when reaching.  Keep electrical cables out of the  way.  Do not use floor polish or wax that makes floors slippery. If you must use wax, use non-skid floor wax.  Remove throw rugs and tripping hazards from the floor. STAIRWAYS  Never leave objects on stairs.  Place handrails on both sides of stairways and use them. Fix any loose handrails. Make sure handrails on both sides of the stairways are as long as the stairs.  Check carpeting to make sure it is firmly attached along stairs. Make repairs to worn or loose carpet promptly.  Avoid placing throw rugs at the top or bottom of stairways, or properly secure the rug with carpet tape to prevent slippage. Get rid of throw rugs, if possible.  Have an electrician put in a light switch at the top and bottom of the stairs. OTHER FALL PREVENTION TIPS  Wear low-heel or rubber-soled shoes that are supportive and fit well. Wear closed toe shoes.  When using a stepladder, make sure it is fully opened and both spreaders are firmly locked. Do not climb a closed stepladder.  Add color or contrast paint or tape to grab bars and handrails in your home. Place contrasting color strips on first and last steps.  Learn and use mobility aids as needed. Install an electrical emergency response system.  Turn on lights to avoid dark areas. Replace light bulbs that burn out immediately. Get light switches that glow.  Arrange furniture to create clear pathways. Keep furniture in the same place.    Firmly attach carpet with non-skid or double-sided tape.  Eliminate uneven floor surfaces.  Select a carpet pattern that does not visually hide the edge of steps.  Be aware of all pets. OTHER HOME SAFETY TIPS  Set the water temperature for 120 F (48.8 C).  Keep emergency numbers on or near the telephone.  Keep smoke detectors on every level of the home and near sleeping areas. Document Released: 06/14/2002 Document Revised: 12/24/2011 Document Reviewed: 09/13/2011 ExitCare Patient Information 2014  [employer]. Spasticity Spasticity is a condition in which certain muscles contract continuously. This causes stiffness or tightness of the muscles. It may interfere with movement, speech, and manner of walking. CAUSES  This condition is usually caused by damage to the portion of the brain or spinal cord that controls voluntary movement. It may occur in association with:  Spinal cord injury.  Multiple sclerosis.  Cerebral palsy.  Brain damage due to lack of oxygen.  Brain trauma.  Severe head injury.  Metabolic diseases such as:  Adrenoleukodystrophy.  ALS (Lou Gehrig's disease).  Phenylketonuria. SYMPTOMS   Increased muscle tone (hypertonicity).  A series of rapid muscle contractions (clonus).  Exaggerated deep tendon reflexes.  Muscle spasms.  Involuntary crossing of the legs (scissoring).  Fixed joints. The degree of spasticity varies. It ranges from mild muscle stiffness to severe, painful, and uncontrollable muscle spasms. It can interfere with rehabilitation in patients with certain disorders. It often interferes with daily activities. TREATMENT  Treatment may include:  Medications.  Physical therapy regimens. They may include muscle stretching and range of motion exercises. These help prevent shrinkage or shortening of muscles. They also help reduce the severity of symptoms.  Surgery. This may be recommended for tendon release or to sever the nerve-muscle pathway. PROGNOSIS  The outcome for those with spasticity depends on:  Severity of the spasticity.  Associated disorder(s). Document Released: 06/14/2002 Document Revised: 09/16/2011 Document Reviewed: 06/24/2005 ExitCare Patient Information 2014 [employer].  

## 2013-01-15 NOTE — Progress Notes (Addendum)
[facility]  Provider:  Dr Deshay Blumenfeld Referring Provider: Lenora Boys, MD Primary Care Physician:  Lenora Boys, MD  Chief Complaint  Patient presents with  . Follow-up    MRI, rm 11    HPI:  Vicki Stein is a 79 y.o. female here as a referral from Dr. Foy Guadalajara for  Inherited spastic paraparesis. Dr Anne Hahn had seen   Vicki Stein last 7-1/2 years ago request of Dr. Foy Guadalajara  at that time, The patient female with a strong family history of hereditary spastic paraparesis she had multiple family members affected by this including 2 sons. Her mother and maternal uncle are affected as well as a granddaughter has been nondiagnostic. When I saw her last she was still walking with a walker, now she is using a wheelchair to avoid falls. 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 She had one fall last year.  she has had a scissor  gait for over 60 years now . In  2006 also developed gout with some additional pain problem mainly in the left leg. She has no bowel or bladder incontinence he reports no new sensory changes.  Her past medical history as significant for a stroke with left parietal infarct in 2000 hypertension occasional migraine headaches, bilateral rotator cuff shoulder repair, gallbladder resection, hypercholesterolemia, gout, degenerative arthritis, bilateral tubal ligation tonsillectomy and appendectomy in her youth. The patient has not been a drinker or smoker over she lives in [place],  [place] she is widowed.  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 She has  a 10th grade education.  She was seen in 11-18-12 for back pain, and  ordered 2 spine MRIs - the results were discussed in todays visit.   MRI of the thoracic and normal spine show age related atrophy and foraminal stenosis but nor cord impingement. The spinal cord at the upper thoracic level but  in a normal person as is expected in her ongoing genetic determination for spastic paraparesis and documents the  Wallerian  degeneration.      The patient still resides in her own private home.     Review of Systems: Out of a complete 14 system review, the patient complains of only the following symptoms, and all other reviewed systems are negative.  leg spasticity - foot and knee pain, severe cramping and stiffness.   History   Social History  . Marital Status: Widowed    Spouse Name: N/A    Number of Children: 3  . Years of Education: 10th   Occupational History  . retired    Social History Main Topics  . Smoking status: Never Smoker   . Smokeless tobacco: Never Used  . Alcohol Use: No  . Drug Use: No  . Sexually Active: Not on file   Other Topics Concern  . Not on file   Social History Narrative   Patient lives is a widow. Patient is retired. Patient has 10th grade education. Right handed.    Family History  Problem Relation Age of Onset  . Heart Problems Mother   . Heart Problems Father     Past Medical History  Diagnosis Date  . Hypertension   . Hyperlipemia   . High cholesterol   . Back pain   . Migraines   . Cerebrovascular disease   . Gout   . Reflux   .  Paraplegia, spastic congenital     inherited .   Marland Kitchen Movement disorder     Past Surgical History  Procedure Laterality Date  . Cholecystectomy    . Rotator cuff repair      Current Outpatient Prescriptions  Medication Sig Dispense Refill  . ALPRAZolam (XANAX) 0.5 MG tablet Take 0.5 mg by mouth at bedtime as needed.        Marland Kitchen atorvastatin (LIPITOR) 40 MG tablet Take 40 mg by mouth daily.        . chlorpheniramine-HYDROcodone (TUSSIONEX PENNKINETIC ER) 10-8 MG/5ML LQCR Take 5 mLs by mouth every 12 (twelve) hours as needed.  90 mL  0  . diclofenac sodium (VOLTAREN) 1 % GEL Apply topically.        . dipyridamole-aspirin (AGGRENOX) 25-200 MG per 12 hr capsule Take 1 capsule by mouth 2 (two)  times daily.        . hydrochlorothiazide (HYDRODIURIL) 25 MG tablet 25 mg daily.      Marland Kitchen HYDROcodone-acetaminophen (NORCO) 10-325 MG per tablet 10-325 tablets as directed. Dr . Foy Guadalajara ,  [facility] .      . lisinopril (PRINIVIL,ZESTRIL) 40 MG tablet Take 40 mg by mouth daily.        . methocarbamol (ROBAXIN) 500 MG tablet Take 500 mg by mouth 4 (four) times daily.        Marland Kitchen omeprazole (PRILOSEC) 20 MG capsule Take 20 mg by mouth daily.         No current facility-administered medications for this visit.    Allergies as of 01/15/2013 - Review Complete 01/15/2013  Allergen Reaction Noted  . Aleve (naproxen sodium)  11/25/2012  . Bactrim (sulfamethoxazole w-trimethoprim)  11/25/2012  . Motrin (ibuprofen)  11/25/2012  . Penicillins  07/07/2011  . Ultram (tramadol)  11/25/2012    Vitals: BP 147/76  Pulse 67  Resp 18  Wt 168 lb (76.204 kg)  BMI 29.77 kg/m2 Last Weight:  Wt Readings from Last 1 Encounters:  01/15/13 168 lb (76.204 kg)   Last Height:   Ht Readings from Last 1 Encounters:  11/25/12 5\' 3"  (1.6 m)     Physical exam:  General: The patient is awake, alert and appears not in acute distress. The patient is well groomed. Head: Normocephalic, atraumatic. Neck is supple. Mallampati 3 , neck circumference:14  Cardiovascular:  Regular rate and rhythmic , without  murmurs or carotid bruit, and without distended neck veins. Respiratory: Lungs are clear to auscultation. Skin:  Without evidence of edema, or rash Trunk: BMI is elevated and patient  Has been leaning to the left- her weaker side.  Neurologic exam : The patient is awake and alert, oriented to place and time.  Memory subjective described as intact. There is a normal attention span & concentration ability. Speech is fluent without   dysarthria, dysphonia or aphasia. Mood and affect are appropriate.  Cranial nerves: Pupils are equal and briskly reactive to light. Funduscopic exam without evidence of pallor or edema.  Extraocular movements  in vertical and horizontal planes intact and without nystagmus. Visual fields by finger perimetry are intact. Hearing severely reduced .  Facial sensation intact to fine touch. Facial motor strength is symmetric and tongue and uvula move midline.  Motor exam: lower extremity spasticity , one trigger finger , weakness of grip.   Sensory:  Fine touch, pinprick and vibration were tested in all extremities. Proprioception is tested in the upper extremities only. This was  normal.  Coordination: Rapid alternating movements in  the fingers/hands is tested and normal.  Finger-to-nose maneuver  without evidence of ataxia, dysmetria or tremor. She is unable to flex or lift the leg without assistance.   Gait and station: Patient is wheelchair bound . Deep tendon reflexes: in the lower extremities are  Very brisk - spastic , symmetric and intact. Babinski maneuver up going.   Assessment:  After physical and neurologic examination, review of laboratory studies, imaging, neurophysiology testing and pre-existing records, assessment will be reviewed on the problem list.   Vicki Stein has to date main concerns  #1 she wants a replacement for her 3-wheel electric scooter but was unable to get one without a doctor's face-to-face evaluation.  #2 her spasticity and the related pain I times unbearable to her. She has used baclofen with some success and Dr. Foy Guadalajara as I  understand refilled her baclofen for her . Dr. Anne Hahn notes  from early 2008 stated that he will start the patient on 10 mg baclofen ,  tablets twice daily.  I would like her to reinitiate this regimen. In addition I would like to try Lyrica or gabapentin for pain with her and also to allow her to sleep with less pain.  Dr. Foy Guadalajara has refilled  Hydrocodone,  a pain medication on an as-needed basis for her.  Plan:  Treatment plan and additional workup will be reviewed under Problem List.  Baclofen 10 mg tab bid po , Neurontin  300 mg at night po.  No refill for hydrocodone from here. Patient will get a new prescription for a scooter ( [facility] ) .

## 2013-01-27 ENCOUNTER — Other Ambulatory Visit: Payer: Self-pay | Admitting: Neurology

## 2013-01-27 DIAGNOSIS — G114 Hereditary spastic paraplegia: Secondary | ICD-10-CM

## 2013-03-25 ENCOUNTER — Emergency Department (HOSPITAL_COMMUNITY): Payer: Medicare Other

## 2013-03-25 ENCOUNTER — Observation Stay (HOSPITAL_COMMUNITY)
Admission: EM | Admit: 2013-03-25 | Discharge: 2013-03-26 | Disposition: A | Discharge status: Home / Self Care | Payer: Medicare Other | Attending: Cardiology | Admitting: Cardiology

## 2013-03-25 ENCOUNTER — Encounter (HOSPITAL_COMMUNITY)
Admission: EM | Disposition: A | Discharge status: Home / Self Care | Payer: Self-pay | Source: Home / Self Care | Attending: Emergency Medicine

## 2013-03-25 ENCOUNTER — Encounter (HOSPITAL_COMMUNITY): Payer: Self-pay | Admitting: *Deleted

## 2013-03-25 DIAGNOSIS — Z79899 Other long term (current) drug therapy: Secondary | ICD-10-CM | POA: Diagnosis not present

## 2013-03-25 DIAGNOSIS — R0789 Other chest pain: Secondary | ICD-10-CM | POA: Diagnosis not present

## 2013-03-25 DIAGNOSIS — I2510 Atherosclerotic heart disease of native coronary artery without angina pectoris: Secondary | ICD-10-CM | POA: Diagnosis not present

## 2013-03-25 DIAGNOSIS — R0602 Shortness of breath: Secondary | ICD-10-CM | POA: Diagnosis not present

## 2013-03-25 DIAGNOSIS — I10 Essential (primary) hypertension: Secondary | ICD-10-CM | POA: Insufficient documentation

## 2013-03-25 DIAGNOSIS — G114 Hereditary spastic paraplegia: Secondary | ICD-10-CM | POA: Diagnosis not present

## 2013-03-25 DIAGNOSIS — G808 Other cerebral palsy: Secondary | ICD-10-CM | POA: Insufficient documentation

## 2013-03-25 DIAGNOSIS — I200 Unstable angina: Secondary | ICD-10-CM | POA: Insufficient documentation

## 2013-03-25 DIAGNOSIS — E785 Hyperlipidemia, unspecified: Secondary | ICD-10-CM | POA: Insufficient documentation

## 2013-03-25 DIAGNOSIS — M109 Gout, unspecified: Secondary | ICD-10-CM | POA: Diagnosis not present

## 2013-03-25 DIAGNOSIS — R079 Chest pain, unspecified: Secondary | ICD-10-CM | POA: Diagnosis not present

## 2013-03-25 DIAGNOSIS — Z8673 Personal history of transient ischemic attack (TIA), and cerebral infarction without residual deficits: Secondary | ICD-10-CM | POA: Insufficient documentation

## 2013-03-25 DIAGNOSIS — Z23 Encounter for immunization: Secondary | ICD-10-CM | POA: Diagnosis not present

## 2013-03-25 DIAGNOSIS — E119 Type 2 diabetes mellitus without complications: Secondary | ICD-10-CM | POA: Diagnosis not present

## 2013-03-25 HISTORY — PX: LEFT HEART CATHETERIZATION WITH CORONARY ANGIOGRAM: SHX5451

## 2013-03-25 LAB — COMPREHENSIVE METABOLIC PANEL
AST: 17 U/L (ref 0–37)
BUN: 20 mg/dL (ref 6–23)
CO2: 23 mEq/L (ref 19–32)
Calcium: 9.4 mg/dL (ref 8.4–10.5)
Creatinine, Ser: 0.89 mg/dL (ref 0.50–1.10)
GFR calc Af Amer: 68 mL/min — ABNORMAL LOW (ref >90)
GFR calc non Af Amer: 58 mL/min — ABNORMAL LOW (ref >90)
Total Bilirubin: 0.3 mg/dL (ref 0.3–1.2)

## 2013-03-25 LAB — CBC WITH DIFFERENTIAL/PLATELET
Hemoglobin: 13.1 g/dL (ref 12.0–15.0)
Lymphocytes Relative: 15 % (ref 12–46)
Lymphs Abs: 1 10*3/uL (ref 0.7–4.0)
MCH: 30.9 pg (ref 26.0–34.0)
Monocytes Relative: 8 % (ref 3–12)
Neutro Abs: 5 10*3/uL (ref 1.7–7.7)
Neutrophils Relative %: 75 % (ref 43–77)
Platelets: 190 10*3/uL (ref 150–400)
RBC: 4.24 MIL/uL (ref 3.87–5.11)
WBC: 6.7 10*3/uL (ref 4.0–10.5)

## 2013-03-25 LAB — TROPONIN I: Troponin I: <0.3 ng/mL (ref <0.30)

## 2013-03-25 LAB — GLUCOSE, CAPILLARY
Glucose-Capillary: 91 mg/dL (ref 70–99)
Glucose-Capillary: 128 mg/dL — ABNORMAL HIGH (ref 70–99)

## 2013-03-25 SURGERY — LEFT HEART CATHETERIZATION WITH CORONARY ANGIOGRAM
Anesthesia: LOCAL

## 2013-03-25 MED ORDER — HEPARIN SODIUM (PORCINE) 1000 UNIT/ML IJ SOLN
INTRAMUSCULAR | Status: AC
  Filled 2013-03-25: qty 1

## 2013-03-25 MED ORDER — ACETAMINOPHEN 325 MG PO TABS: 650.0000 mg | ORAL_TABLET | Freq: Four times a day (QID) | ORAL | Status: DC | PRN

## 2013-03-25 MED ORDER — ASPIRIN 81 MG PO CHEW
CHEWABLE_TABLET | ORAL | Status: AC
  Filled 2013-03-25: qty 3

## 2013-03-25 MED ORDER — SODIUM CHLORIDE 0.9 % IV SOLN
INTRAVENOUS | Status: AC
  Administered 2013-03-25: 17:00:00 via INTRAVENOUS

## 2013-03-25 MED ORDER — HEPARIN (PORCINE) IN NACL 2-0.9 UNIT/ML-% IJ SOLN
INTRAMUSCULAR | Status: AC
  Filled 2013-03-25: qty 1000

## 2013-03-25 MED ORDER — ENOXAPARIN SODIUM 40 MG/0.4ML ~~LOC~~ SOLN
40.0000 mg | SUBCUTANEOUS | Status: DC
  Filled 2013-03-25 (×2): qty 0.4

## 2013-03-25 MED ORDER — ATORVASTATIN CALCIUM 40 MG PO TABS
40.0000 mg | ORAL_TABLET | Freq: Every day | ORAL | Status: DC
  Administered 2013-03-26: 10:00:00 40 mg via ORAL
  Filled 2013-03-25: qty 1

## 2013-03-25 MED ORDER — LISINOPRIL 40 MG PO TABS
40.0000 mg | ORAL_TABLET | Freq: Every day | ORAL | Status: DC
  Administered 2013-03-26: 10:00:00 40 mg via ORAL
  Filled 2013-03-25: qty 1

## 2013-03-25 MED ORDER — VERAPAMIL HCL 2.5 MG/ML IV SOLN
INTRAVENOUS | Status: AC
  Filled 2013-03-25: qty 2

## 2013-03-25 MED ORDER — ONDANSETRON HCL 4 MG/2ML IJ SOLN: 4.0000 mg | Freq: Four times a day (QID) | INTRAMUSCULAR | Status: DC | PRN

## 2013-03-25 MED ORDER — METHOCARBAMOL 500 MG PO TABS: 500.0000 mg | ORAL_TABLET | Freq: Four times a day (QID) | ORAL | Status: DC | PRN

## 2013-03-25 MED ORDER — NITROGLYCERIN 0.2 MG/ML ON CALL CATH LAB
INTRAVENOUS | Status: AC
  Filled 2013-03-25: qty 1

## 2013-03-25 MED ORDER — ACETAMINOPHEN 325 MG PO TABS: 650.0000 mg | ORAL_TABLET | ORAL | Status: DC | PRN

## 2013-03-25 MED ORDER — ONDANSETRON HCL 4 MG PO TABS: 4.0000 mg | ORAL_TABLET | Freq: Four times a day (QID) | ORAL | Status: DC | PRN

## 2013-03-25 MED ORDER — PANTOPRAZOLE SODIUM 40 MG PO TBEC
40.0000 mg | DELAYED_RELEASE_TABLET | Freq: Every day | ORAL | Status: DC
  Administered 2013-03-25 – 2013-03-26 (×2): 40 mg via ORAL
  Filled 2013-03-25 (×2): qty 1

## 2013-03-25 MED ORDER — ASPIRIN-DIPYRIDAMOLE ER 25-200 MG PO CP12
1.0000 | ORAL_CAPSULE | Freq: Two times a day (BID) | ORAL | Status: DC
  Administered 2013-03-25 – 2013-03-26 (×2): 1 via ORAL
  Filled 2013-03-25 (×3): qty 1

## 2013-03-25 MED ORDER — ASPIRIN 81 MG PO CHEW
81.0000 mg | CHEWABLE_TABLET | Freq: Once | ORAL | Status: AC
  Administered 2013-03-25: 81 mg via ORAL
  Filled 2013-03-25: qty 1

## 2013-03-25 MED ORDER — ALPRAZOLAM 0.25 MG PO TABS
0.2500 mg | ORAL_TABLET | Freq: Every evening | ORAL | Status: DC | PRN
  Filled 2013-03-25: qty 1

## 2013-03-25 MED ORDER — HEPARIN SODIUM (PORCINE) 5000 UNIT/ML IJ SOLN
4000.0000 [IU] | Freq: Once | INTRAMUSCULAR | Status: AC
  Administered 2013-03-25: 4000 [IU] via INTRAVENOUS
  Filled 2013-03-25: qty 4

## 2013-03-25 MED ORDER — INSULIN ASPART 100 UNIT/ML ~~LOC~~ SOLN: 0.0000 [IU] | Freq: Every day | SUBCUTANEOUS | Status: DC

## 2013-03-25 MED ORDER — ALPRAZOLAM 0.25 MG PO TABS
0.2500 mg | ORAL_TABLET | Freq: Two times a day (BID) | ORAL | Status: DC | PRN
  Administered 2013-03-25: 22:00:00 0.25 mg via ORAL

## 2013-03-25 MED ORDER — SODIUM CHLORIDE 0.9 % IJ SOLN: 3.0000 mL | Freq: Two times a day (BID) | INTRAMUSCULAR | Status: DC

## 2013-03-25 MED ORDER — FENTANYL CITRATE 0.05 MG/ML IJ SOLN
INTRAMUSCULAR | Status: AC
  Filled 2013-03-25: qty 2

## 2013-03-25 MED ORDER — GI COCKTAIL ~~LOC~~
30.0000 mL | Freq: Four times a day (QID) | ORAL | Status: DC | PRN
  Filled 2013-03-25: qty 30

## 2013-03-25 MED ORDER — MIDAZOLAM HCL 2 MG/2ML IJ SOLN
INTRAMUSCULAR | Status: AC
  Filled 2013-03-25: qty 2

## 2013-03-25 MED ORDER — COLCHICINE 0.6 MG PO TABS
0.6000 mg | ORAL_TABLET | Freq: Two times a day (BID) | ORAL | Status: DC | PRN
  Filled 2013-03-25: qty 1

## 2013-03-25 MED ORDER — INSULIN ASPART 100 UNIT/ML ~~LOC~~ SOLN
0.0000 [IU] | Freq: Three times a day (TID) | SUBCUTANEOUS | Status: DC
  Administered 2013-03-26: 09:00:00 2 [IU] via SUBCUTANEOUS

## 2013-03-25 MED ORDER — LIDOCAINE HCL (PF) 1 % IJ SOLN
INTRAMUSCULAR | Status: AC
  Filled 2013-03-25: qty 30

## 2013-03-25 MED ORDER — HYDROCODONE-ACETAMINOPHEN 10-325 MG PO TABS: 0.5000 | ORAL_TABLET | Freq: Four times a day (QID) | ORAL | Status: DC | PRN

## 2013-03-25 MED ORDER — ACETAMINOPHEN 650 MG RE SUPP: 650.0000 mg | Freq: Four times a day (QID) | RECTAL | Status: DC | PRN

## 2013-03-25 MED ORDER — DICLOFENAC SODIUM 1 % TD GEL
2.0000 g | Freq: Two times a day (BID) | TRANSDERMAL | Status: DC | PRN
  Filled 2013-03-25: qty 100

## 2013-03-25 MED ORDER — ZOLPIDEM TARTRATE 5 MG PO TABS: 5.0000 mg | ORAL_TABLET | Freq: Every evening | ORAL | Status: DC | PRN

## 2013-03-25 NOTE — Consult Note (Signed)
 CARDIOLOGY CONSULT NOTE   Patient ID: Vicki Stein MRN: [id number] DOB/AGE: 09/21/1927 79 y.o.  Admit date: 03/25/2013  Primary Physician   FRIED, ROBERT L, MD Primary Cardiologist   New  Reason for Consultation   Chest pain  HPI:Vicki Stein is a 79 y.o. female with no history of CAD.  She had been walking for a while on 09/16 and developed SSCP, hurt real bad. 08/10. She was hot/cold and very diaphoretic with this. She does not remember if it got better/worse with deep inspiration. It lasted for several hours, was gone when she woke up the next day. Yesterday, she woke up with no Sx but developed them during the later afternoon and they lasted all day. She took a GaxX but it did not help immediately. They were not severe and she was able to go to sleep. The pressure/tightness worsened and woke her during the night. It went into her left shoulder. The pain eventually decreased but did not resolve. She went to her primary MD, and was sent to the ER. In the ER, only ASA was given. Currently in the ER, she is having chest pressure at a 2/10. The pain has not been a 0/10 since yesterday afternoon.  She has chronic DOE that has not changed recently. She has used a walker chronically for > 10 years and is concerned that she is putting stress on her shoulders with that. She has reflux symptoms chronically, that is burning and is different.    Past Medical History  Diagnosis Date  . Hypertension   . Hyperlipemia   . High cholesterol   . Back pain   . Migraines   . Cerebrovascular disease   . Gout   . Reflux   . Paraplegia, spastic congenital     inherited .   . Movement disorder      Past Surgical History  Procedure Laterality Date  . Cholecystectomy    . Rotator cuff repair      Allergies  Allergen Reactions  . Aleve [Naproxen Sodium] Itching and Swelling  . Bactrim [Sulfamethoxazole W-Trimethoprim] Itching    Had a bad reaction, can't remember, worked me over  . Motrin  [Ibuprofen] Itching and Swelling  . Penicillins Hives and Itching  . Ultram [Tramadol]     Unknown - on list of allergies, but dog takes    I have reviewed the patient's current medications Prior to Admission medications   Medication Sig Start Date End Date Taking? Authorizing Provider  ALPRAZolam (XANAX) 0.5 MG tablet Take 0.25-0.5 mg by mouth at bedtime as needed for sleep.   Yes Historical Provider, MD  atorvastatin (LIPITOR) 40 MG tablet Take 40 mg by mouth daily.   Yes Historical Provider, MD  cephALEXin (KEFLEX) 750 MG capsule Take 750 mg by mouth daily as needed (for gout).   Yes Historical Provider, MD  colchicine 0.6 MG tablet Take 0.6 mg by mouth 2 (two) times daily as needed (for gout flares).   Yes Historical Provider, MD  diclofenac sodium (VOLTAREN) 1 % GEL Apply 2 g topically 2 (two) times daily as needed (for pain). Apply to knees, ankles, and hands, for arthritis   Yes Historical Provider, MD  dipyridamole-aspirin (AGGRENOX) 200-25 MG per 12 hr capsule Take 1 capsule by mouth 2 (two) times daily.   Yes Historical Provider, MD  HYDROcodone-acetaminophen (NORCO) 10-325 MG per tablet Take 0.5-1 tablets by mouth every 6 (six) hours as needed for pain.   Yes Historical Provider, MD    lisinopril (PRINIVIL,ZESTRIL) 40 MG tablet Take 40 mg by mouth daily.   Yes Historical Provider, MD  methocarbamol (ROBAXIN) 500 MG tablet Take 500 mg by mouth 4 (four) times daily as needed (for muscle spasms).   Yes Historical Provider, MD  omeprazole (PRILOSEC) 20 MG capsule Take 20 mg by mouth daily.   Yes Historical Provider, MD     History   Social History  . Marital Status: Widowed    Spouse Name: N/A    Number of Children: 3  . Years of Education: 10th   Occupational History  . retired    Social History Main Topics  . Smoking status: Never Smoker   . Smokeless tobacco: Never Used  . Alcohol Use: No  . Drug Use: No  . Sexual Activity: Not on file   Other Topics Concern  . Not on  file   Social History Narrative   Patient lives alone, is a widow. Patient is retired. Patient has 10th grade education. Right handed.    Family Status  Relation Status Death Age  . Mother Deceased 85  . Father Deceased 71   Family History  Problem Relation Age of Onset  . Heart Problems Mother   . Heart Problems Father   . Heart attack Son      ROS: She has not had any recent illnesses, fevers or chills. She gets occasional palpitations but never gets presyncope or syncope. Full 14 point review of systems complete and found to be negative unless listed above.  Physical Exam: Temperature 97.4 F (36.3 C), temperature source Axillary, resp. rate 15.  General: Well developed, well nourished, overweight wf in no acute distress Head:  Normocephalic and atraumatic Lungs: Inspiratory and expiratory wheezing bilaterally at bases Heart: HRRR S1 S2, no rub/gallop, no murmur. pulses are 2+ all 4 extrem.   Neck: No carotid bruits. JVD not elevated. Abdomen: Bowel sounds present, abdomen soft and non-tender without masses or hernias noted. Extremities: No clubbing or cyanosis. No edema.  Neuro: Alert and oriented X 3. No focal deficits noted. Psych:  Good affect, responds appropriately Skin: No rashes or lesions noted.  Labs:   Lab Results  Component Value Date   WBC 6.7 03/25/2013   HGB 13.1 03/25/2013   HCT 39.0 03/25/2013   MCV 92.0 03/25/2013   PLT 190 03/25/2013     Recent Labs Lab 03/25/13 1254  NA 140  K 4.3  CL 106  CO2 23  BUN 20  CREATININE 0.89  CALCIUM 9.4  PROT 6.5  BILITOT 0.3  ALKPHOS 97  ALT 13  AST 17  GLUCOSE 184*  ALBUMIN 3.6    Recent Labs  03/25/13 1254  TROPONINI <0.30    ECG:  SR, no acute ischemic changes.  Radiology:  No results found.  ASSESSMENT AND PLAN:   The patient was seen today by Dr. Rannie Craney, the patient evaluated and the data reviewed.  Principal Problem:   Unstable angina - Multiple CRFs and exertional symptoms. Cardiac cath  would be a definitive test and allow treatment of any significant stenosis found.   Otherwise, per primary MD. Continue home Rx and screen for CRF control. Active Problems:   Paraplegia, spastic congenital   Hereditary spastic paraparesis   Dyslipidemia   HTN (hypertension)   Gout   Signed: Rhonda Barrett, PA-C 03/25/2013 2:52 PM Beeper [phone] Patient seen and examined and history reviewed. Agree with above findings and plan. 79 yo WF with history of HTN, hyperlipidemia, pre diabetes and Fhx   of early CAD presents with a 2 day history of Chest pain. Pain is midsternal radiating into left shoulder. Associated with diaphoresis and SOB. No relief with GasX. Pain worse today. Last cardiac evaluation with an adenosine myoview study in 2004. She has been on antireflux Rx. Initial troponin is negative. Ecg shows NSR with poor R wave progression and LAD. I think her symptoms are very concerning for unstable angina. I had a lengthy discussion with her concerning further evaluation. We discussed admitting her for observation with possible lexiscan myoview if cardiac enzymes were negative. The other option we discussed was proceeding with cardiac cath. After we reviewed theses options including potential risks she elected to proceed directly to cardiac cath with possible PCI. Procedure scheduled for this afternoon. Will start IV hydration and give 4000 units IV heparin pending cath results.   Tobias Avitabile JordanMD,FACC 03/25/2013 3:08 PM     

## 2013-03-25 NOTE — ED Notes (Signed)
Patient states she started having chest discomfort on Tuesday and it has progressively gotten worse and states when she woke up this morning she felt  heavy pressure in the middle of her chest. Patient states she has been short of breath since Tuesday as well.

## 2013-03-25 NOTE — CV Procedure (Signed)
    Cardiac Catheterization Operative Report  Vicki Stein [id number] 9/18/20144:06 PM FRIED, Doris Cheadle, MD  Procedure Performed:  1. Left Heart Catheterization 2. Selective Coronary Angiography 3. Left ventricular angiogram  Operator: Verne Carrow, MD  Arterial access site:  Right radial artery.   Indication:  79 yo female with HTN, HLD, CVA admitted with chest pain concerning for unstable angina. Troponin negative. EKG without ischemic changes.                                      Procedure Details: The risks, benefits, complications, treatment options, and expected outcomes were discussed with the patient. The patient and/or family concurred with the proposed plan, giving informed consent. The patient was brought to the cath lab after IV hydration was begun and oral premedication was given. The patient was further sedated with Versed. The right wrist was assessed with an Allens test which was positive. The right wrist was prepped and draped in a sterile fashion. 1% lidocaine was used for local anesthesia. Using the modified Seldinger access technique, a 5 French sheath was placed in the right radial artery. 3 mg Verapamil was given through the sheath. 3000 units IV heparin was given. Standard diagnostic catheters were used to perform selective coronary angiography. A pigtail catheter was used to perform a left ventricular angiogram. The sheath was removed from the right radial artery and a Terumo hemostasis band was applied at the arteriotomy site on the right wrist.   There were no immediate complications. The patient was taken to the recovery area in stable condition.   Hemodynamic Findings: Central aortic pressure: 125/58 Left ventricular pressure: 123/6/11  Angiographic Findings:  Left main: Proximal 30% stenosis.   Left Anterior Descending Artery: Large caliber vessel that courses to the apex. Calcification is noted in the proximal vessel. There are two moderate  caliber diagonal branches. The first diagonal branch has ostial 50% stenosis. The second diagonal branch has diffuse 40-50% stenosis. The LAD has no obstructive disease.   Circumflex Artery: Large caliber vessel with intermediate branch and moderate caliber obtuse marginal branch. The intermediate branch is moderate in caliber with proximal 30% stenosis. The mid Circumflex has a 30% stenosis.   Right Coronary Artery: Moderate caliber vessel with 30% mid stenosis. There is calcification noted in the proximal and mid vessel.   Left Ventricular Angiogram: LVEF=65%.   Impression: 1. Moderate non-obstructive CAD 2. Normal LV systolic function 3. Non-cardiac chest pain  Recommendations: No further ischemic evaluation. Will monitor overnight. Aggressive risk factor modification. Will start ASA, statin, beta blocker.        Complications:  None. The patient tolerated the procedure well.

## 2013-03-25 NOTE — Progress Notes (Signed)
TR BAND REMOVAL  LOCATION:    right radial  DEFLATED PER PROTOCOL:    yes  TIME BAND OFF / DRESSING APPLIED:    2030 , area cleansed w/ CHG wipe and redressed w/ 2x2/tegaderm.  Post radial cath instructions reviewed and written instructions given.  SITE UPON ARRIVAL:    Level 0  SITE AFTER BAND REMOVAL:    Level 1  REVERSE ALLEN'S TEST:     positive  CIRCULATION SENSATION AND MOVEMENT:    Within Normal Limits   yes  COMMENTS:   Day RN reports that during deflation, pt developed hematoma that compressed well with 5 minutes manual pressure.  Area proximal to radial cath site is bruised approx 4cm diameter.  Tender but pt refused tylenol.  Dr Gala Romney informed of hematoma and that lovenox is due, he advised to hold lovenox tonight.

## 2013-03-25 NOTE — H&P (View-Only) (Signed)
CARDIOLOGY CONSULT NOTE   Patient ID: Vicki Stein MRN: [id number] DOB/AGE: 1927-11-24 79 y.o.  Admit date: 03/25/2013  Primary Physician   FRIED, Doris Cheadle, MD Primary Cardiologist   New  Reason for Consultation   Chest pain  Vicki Stein is a 79 y.o. female with no history of CAD.  She had been walking for a while on 09/16 and developed SSCP, hurt real bad. 08/10. She was hot/cold and very diaphoretic with this. She does not remember if it got better/worse with deep inspiration. It lasted for several hours, was gone when she woke up the next day. Yesterday, she woke up with no Sx but developed them during the later afternoon and they lasted all day. She took a GaxX but it did not help immediately. They were not severe and she was able to go to sleep. The pressure/tightness worsened and woke her during the night. It went into her left shoulder. The pain eventually decreased but did not resolve. She went to her primary MD, and was sent to the ER. In the ER, only ASA was given. Currently in the ER, she is having chest pressure at a 2/10. The pain has not been a 0/10 since yesterday afternoon.  She has chronic DOE that has not changed recently. She has used a walker chronically for > 10 years and is concerned that she is putting stress on her shoulders with that. She has reflux symptoms chronically, that is burning and is different.    Past Medical History  Diagnosis Date  . Hypertension   . Hyperlipemia   . High cholesterol   . Back pain   . Migraines   . Cerebrovascular disease   . Gout   . Reflux   . Paraplegia, spastic congenital     inherited .   Marland Kitchen Movement disorder      Past Surgical History  Procedure Laterality Date  . Cholecystectomy    . Rotator cuff repair      Allergies  Allergen Reactions  . Aleve [Naproxen Sodium] Itching and Swelling  . Bactrim [Sulfamethoxazole W-Trimethoprim] Itching    Had a bad reaction, can't remember, worked me over  . Motrin  [Ibuprofen] Itching and Swelling  . Penicillins Hives and Itching  . Ultram [Tramadol]     Unknown - on list of allergies, but dog takes    I have reviewed the patient's current medications Prior to Admission medications   Medication Sig Start Date End Date Taking? Authorizing Provider  ALPRAZolam Prudy Feeler) 0.5 MG tablet Take 0.25-0.5 mg by mouth at bedtime as needed for sleep.   Yes Historical Provider, MD  atorvastatin (LIPITOR) 40 MG tablet Take 40 mg by mouth daily.   Yes Historical Provider, MD  cephALEXin (KEFLEX) 750 MG capsule Take 750 mg by mouth daily as needed (for gout).   Yes Historical Provider, MD  colchicine 0.6 MG tablet Take 0.6 mg by mouth 2 (two) times daily as needed (for gout flares).   Yes Historical Provider, MD  diclofenac sodium (VOLTAREN) 1 % GEL Apply 2 g topically 2 (two) times daily as needed (for pain). Apply to knees, ankles, and hands, for arthritis   Yes Historical Provider, MD  dipyridamole-aspirin (AGGRENOX) 200-25 MG per 12 hr capsule Take 1 capsule by mouth 2 (two) times daily.   Yes Historical Provider, MD  HYDROcodone-acetaminophen (NORCO) 10-325 MG per tablet Take 0.5-1 tablets by mouth every 6 (six) hours as needed for pain.   Yes Historical Provider, MD  lisinopril (PRINIVIL,ZESTRIL) 40 MG tablet Take 40 mg by mouth daily.   Yes Historical Provider, MD  methocarbamol (ROBAXIN) 500 MG tablet Take 500 mg by mouth 4 (four) times daily as needed (for muscle spasms).   Yes Historical Provider, MD  omeprazole (PRILOSEC) 20 MG capsule Take 20 mg by mouth daily.   Yes Historical Provider, MD     History   Social History  . Marital Status: Widowed    Spouse Name: N/A    Number of Children: 3  . Years of Education: 10th   Occupational History  . retired    Social History Main Topics  . Smoking status: Never Smoker   . Smokeless tobacco: Never Used  . Alcohol Use: No  . Drug Use: No  . Sexual Activity: Not on file   Other Topics Concern  . Not on  file   Social History Narrative   Patient lives alone, is a widow. Patient is retired. Patient has 10th grade education. Right handed.    Family Status  Relation Status Death Age  . Mother Deceased 52  . Father Deceased 44   Family History  Problem Relation Age of Onset  . Heart Problems Mother   . Heart Problems Father   . Heart attack Son      ROS: She has not had any recent illnesses, fevers or chills. She gets occasional palpitations but never gets presyncope or syncope. Full 14 point review of systems complete and found to be negative unless listed above.  Physical Exam: Temperature 97.4 F (36.3 C), temperature source Axillary, resp. rate 15.  General: Well developed, well nourished, overweight wf in no acute distress Head:  Normocephalic and atraumatic Lungs: Inspiratory and expiratory wheezing bilaterally at bases Heart: HRRR S1 S2, no rub/gallop, no murmur. pulses are 2+ all 4 extrem.   Neck: No carotid bruits. JVD not elevated. Abdomen: Bowel sounds present, abdomen soft and non-tender without masses or hernias noted. Extremities: No clubbing or cyanosis. No edema.  Neuro: Alert and oriented X 3. No focal deficits noted. Psych:  Good affect, responds appropriately Skin: No rashes or lesions noted.  Labs:   Lab Results  Component Value Date   WBC 6.7 03/25/2013   HGB 13.1 03/25/2013   HCT 39.0 03/25/2013   MCV 92.0 03/25/2013   PLT 190 03/25/2013     Recent Labs Lab 03/25/13 1254  NA 140  K 4.3  CL 106  CO2 23  BUN 20  CREATININE 0.89  CALCIUM 9.4  PROT 6.5  BILITOT 0.3  ALKPHOS 97  ALT 13  AST 17  GLUCOSE 184*  ALBUMIN 3.6    Recent Labs  03/25/13 1254  TROPONINI <0.30    ECG:  SR, no acute ischemic changes.  Radiology:  No results found.  ASSESSMENT AND PLAN:   The patient was seen today by Dr. Sw[place]il[place], the patient evaluated and the data reviewed.  Principal Problem:   Unstable angina - Multiple CRFs and exertional symptoms. Cardiac cath  would be a definitive test and allow treatment of any significant stenosis found.   Otherwise, per primary MD. Continue home Rx and screen for CRF control. Active Problems:   Paraplegia, spastic congenital   Hereditary spastic paraparesis   Dyslipidemia   HTN (hypertension)   Gout   Signed: Theodore Demark, PA-C 03/25/2013 2:52 PM Beeper [phone] Patient seen and examined and history reviewed. Agree with above findings and plan. 79 yo WF with history of HTN, hyperlipidemia, pre diabetes and Fhx  of early CAD presents with a 2 day history of Chest pain. Pain is midsternal radiating into left shoulder. Associated with diaphoresis and SOB. No relief with GasX. Pain worse today. Last cardiac evaluation with an adenosine myoview study in 2004. She has been on antireflux Rx. Initial troponin is negative. Ecg shows NSR with poor R wave progression and LAD. I think her symptoms are very concerning for unstable angina. I had a lengthy discussion with her concerning further evaluation. We discussed admitting her for observation with possible lexiscan myoview if cardiac enzymes were negative. The other option we discussed was proceeding with cardiac cath. After we reviewed theses options including potential risks she elected to proceed directly to cardiac cath with possible PCI. Procedure scheduled for this afternoon. Will start IV hydration and give 4000 units IV heparin pending cath results.   Theron Arista [facility] 03/25/2013 3:08 PM

## 2013-03-25 NOTE — ED Provider Notes (Signed)
CSN: [id number]     Arrival date & time 03/25/13  1218 History   First MD Initiated Contact with Patient 03/25/13 1223     Chief Complaint  Patient presents with  . Chest Pain   (Consider location/radiation/quality/duration/timing/severity/associated sxs/prior Treatment) HPI Comments: 79 yo female with spastic paraparesis, htn, cholesterol, FH cardiac presents with intermittent central chest pain/ sob/ diaphoresis for the past few days. Progressively worsened, at times with exertion.  Patient denies blood clot history, active cancer, recent major trauma or surgery, unilateral leg swelling/ pain, recent long travel, hemoptysis.  No recent cardiac eval or known stents.  Non smoker.  Sent over by pcp.  Episodes last minutes to hours, no current sxs since one hr ago. Radiates to left shoulder and anterior neck.   Patient is a 79 y.o. female presenting with chest pain. The history is provided by the patient.  Chest Pain   Past Medical History  Diagnosis Date  . Hypertension   . Hyperlipemia   . High cholesterol   . Back pain   . Migraines   . Cerebrovascular disease   . Gout   . Reflux   . Paraplegia, spastic congenital     inherited .   Marland Kitchen Movement disorder    Past Surgical History  Procedure Laterality Date  . Cholecystectomy    . Rotator cuff repair     Family History  Problem Relation Age of Onset  . Heart Problems Mother   . Heart Problems Father    History  Substance Use Topics  . Smoking status: Never Smoker   . Smokeless tobacco: Never Used  . Alcohol Use: No   OB History   Grav Para Term Preterm Abortions TAB SAB Ect Mult Living                 Review of Systems  Cardiovascular: Positive for chest pain.    Allergies  Aleve; Bactrim; Motrin; Penicillins; and Ultram  Home Medications   Current Outpatient Rx  Name  Route  Sig  Dispense  Refill  . ALPRAZolam (XANAX) 0.5 MG tablet   Oral   Take 0.5 mg by mouth at bedtime as needed.           Marland Kitchen  atorvastatin (LIPITOR) 40 MG tablet   Oral   Take 40 mg by mouth daily.           . baclofen (LIORESAL) 10 MG tablet   Oral   Take 1 tablet (10 mg total) by mouth 2 (two) times daily.   60 each   5   . diclofenac sodium (VOLTAREN) 1 % GEL   Topical   Apply topically.           . dipyridamole-aspirin (AGGRENOX) 25-200 MG per 12 hr capsule   Oral   Take 1 capsule by mouth 2 (two) times daily.           . hydrochlorothiazide (HYDRODIURIL) 25 MG tablet      25 mg daily.         Marland Kitchen HYDROcodone-acetaminophen (NORCO) 10-325 MG per tablet      10-325 tablets as directed. Dr . Foy [place] ,  [facility] [place] .         . lisinopril (PRINIVIL,ZESTRIL) 40 MG tablet   Oral   Take 40 mg by mouth daily.           Marland Kitchen omeprazole (PRILOSEC) 20 MG capsule   Oral   Take 20 mg by mouth daily.  Temp(Src) 97.4 F (36.3 C) (Axillary)  Resp 15 Physical Exam  Nursing note and vitals reviewed. Constitutional: She is oriented to person, place, and time. She appears well-developed and well-nourished.  HENT:  Head: Normocephalic and atraumatic.  Eyes: Conjunctivae are normal. Right eye exhibits no discharge. Left eye exhibits no discharge.  Neck: Normal range of motion. Neck supple. No tracheal deviation present.  Cardiovascular: Normal rate and regular rhythm.   No murmur heard. Pulmonary/Chest: Effort normal and breath sounds normal.  Abdominal: Soft. She exhibits no distension. There is no tenderness. There is no guarding.  Musculoskeletal: She exhibits no edema and no tenderness.  Neurological: She is alert and oriented to person, place, and time.  Skin: Skin is warm. No rash noted.  Psychiatric: She has a normal mood and affect.    ED Course  Procedures (including critical care time) Labs Review Labs Reviewed  COMPREHENSIVE METABOLIC PANEL - Abnormal; Notable for the following:    Glucose, Bld 184 (*)    GFR calc non Af Amer 58 (*)    GFR calc Af Amer 68 (*)    All  other components within normal limits  TROPONIN I  CBC WITH DIFFERENTIAL  GLUCOSE, CAPILLARY  HEMOGLOBIN A1C  COMPREHENSIVE METABOLIC PANEL  CBC  TSH   Imaging Review No results found.  MDM  No diagnosis found. With intermittent sxs, exertional component, risk factors/ age concern for CAD. EKG new LBBB.  No cp on exam. Pt on aggrenox, asa baby added.  Plan for observation/ tele for further cardiac evaluation. CXR.   Date: 03/25/2013  Rate: 91  Rhythm: normal sinus rhythm  QRS Axis: normal  Intervals: PR prolonged  ST/T Wave abnormalities: normal  Conduction Disutrbances:first-degree A-V block  and left bundle branch block  Narrative Interpretation:   Old EKG Reviewed: changes noted LBBB new  Spoke with hospitalist and cardiology, cards evaluated and recommended transfer for possible cath, they will take pt primarily, updated hospitalist and pt.  No cp or sob on recheck.   Admitted.  Chest pain, HTN, New LBBB  Enid Skeens, MD 03/25/13 2210

## 2013-03-25 NOTE — Interval H&P Note (Signed)
History and Physical Interval Note:  03/25/2013 4:03 PM  Vicki Stein  has presented today for cardiac cath with the diagnosis of Chest pain  The various methods of treatment have been discussed with the patient and family. After consideration of risks, benefits and other options for treatment, the patient has consented to  Procedure(s): LEFT HEART CATHETERIZATION WITH CORONARY ANGIOGRAM (N/A) as a surgical intervention .  The patient's history has been reviewed, patient examined, no change in status, stable for surgery.  I have reviewed the patient's chart and labs.  Questions were answered to the patient's satisfaction.    Cath Lab Visit (complete for each Cath Lab visit)  Clinical Evaluation Leading to the Procedure:   ACS: no  Non-ACS:    Anginal Classification: CCS III  Anti-ischemic medical therapy: No Therapy  Non-Invasive Test Results: No non-invasive testing performed  Prior CABG: No previous CABG         Reyne Falconi

## 2013-03-26 DIAGNOSIS — I200 Unstable angina: Secondary | ICD-10-CM | POA: Diagnosis not present

## 2013-03-26 LAB — HEMOGLOBIN A1C
Hgb A1c MFr Bld: 6.1 % — ABNORMAL HIGH
Mean Plasma Glucose: 128 mg/dL — ABNORMAL HIGH

## 2013-03-26 LAB — COMPREHENSIVE METABOLIC PANEL
Albumin: 3.3 g/dL — ABNORMAL LOW (ref 3.5–5.2)
Alkaline Phosphatase: 93 U/L (ref 39–117)
BUN: 16 mg/dL (ref 6–23)
Chloride: 110 mEq/L (ref 96–112)
Creatinine, Ser: 0.86 mg/dL (ref 0.50–1.10)
GFR calc Af Amer: 70 mL/min — ABNORMAL LOW (ref >90)
GFR calc non Af Amer: 61 mL/min — ABNORMAL LOW (ref >90)
Glucose, Bld: 145 mg/dL — ABNORMAL HIGH (ref 70–99)
Potassium: 4.1 mEq/L (ref 3.5–5.1)
Total Bilirubin: 0.3 mg/dL (ref 0.3–1.2)

## 2013-03-26 LAB — CBC
MCV: 92.4 fL (ref 78.0–100.0)
Platelets: 169 10*3/uL (ref 150–400)
RBC: 4.06 MIL/uL (ref 3.87–5.11)
RDW: 13.7 % (ref 11.5–15.5)
WBC: 6.8 10*3/uL (ref 4.0–10.5)

## 2013-03-26 LAB — TSH: TSH: 0.548 u[IU]/mL (ref 0.350–4.500)

## 2013-03-26 NOTE — Discharge Summary (Signed)
CARDIOLOGY DISCHARGE SUMMARY   Patient ID: Vicki Stein MRN: [id number] DOB/AGE: 08/31/1927 79 y.o.  Admit date: 03/25/2013 Discharge date: 03/26/2013  Primary Discharge Diagnosis:    Unstable angina - medical therapy for CAD Secondary Discharge Diagnosis:    Paraplegia, spastic congenital   Hereditary spastic paraparesis   Chest pain   Dyslipidemia   HTN (hypertension)   Gout  Procedure Performed:  1. Left Heart Catheterization 2. Selective Coronary Angiography 3. Left ventricular angiogram  Hospital Course: Vicki Stein is a 79 y.o. female with no history of CAD. She had repeated episodes of chest pain and came to the ER. Her symptoms were concerning for progressive angina. She was continuing to have mild chest pain. She had initially gone to [facility] ER, but cardiac catheterization was indicated. She was transferred to [facility] and taken to the cath lab.   Cardiac cath results are below. Medical therapy for non-obstructive disease was recommended. She tolerated the procedure well. She was held overnight for monitoring and hydration. Her blood sugars were managed with SSI. Her HgbA1c was checked and indicated good control of her borderline DM.  Her symptoms did not return. She did well overnight. Her labs were rechecked on 9/19 and she had no significant abnormalities.   On 9/19, she was seen by Dr. Swaziland. She was doing well and ambulating without chest pain or SOB. Dr. Swaziland evaluated her and considered her stable for discharge, to follow up as an outpatient.  Labs:  Lab Results  Component Value Date   WBC 6.8 03/26/2013   HGB 12.3 03/26/2013   HCT 37.5 03/26/2013   MCV 92.4 03/26/2013   PLT 169 03/26/2013     Recent Labs Lab 03/26/13 0610  NA 142  K 4.1  CL 110  CO2 20  BUN 16  CREATININE 0.86  CALCIUM 9.0  PROT 5.8*  BILITOT 0.3  ALKPHOS 93  ALT 13  AST 16  GLUCOSE 145*    Recent Labs  03/25/13 1254  TROPONINI <0.30   Lab Results    Component Value Date   HGBA1C 6.1* 03/25/2013   Radiology: Dg Chest 2 View 03/25/2013   CLINICAL DATA:  Chest pain, shortness of breath  EXAM: CHEST  2 VIEW  COMPARISON:  03/12/2012  FINDINGS: Lungs are essentially clear. No focal consolidation. No pleural effusion or pneumothorax.  The heart is normal in size.  Degenerative changes of the visualized thoracolumbar spine.  Cholecystectomy clips.  IMPRESSION: No evidence of acute cardiopulmonary disease.   Electronically Signed   By: Charline Bills M.D.   On: 03/25/2013 15:11    Cardiac Cath: 03/25/2013 Left main: Proximal 30% stenosis.  Left Anterior Descending Artery: Large caliber vessel that courses to the apex. Calcification is noted in the proximal vessel. There are two moderate caliber diagonal branches. The first diagonal branch has ostial 50% stenosis. The second diagonal branch has diffuse 40-50% stenosis. The LAD has no obstructive disease.  Circumflex Artery: Large caliber vessel with intermediate branch and moderate caliber obtuse marginal branch. The intermediate branch is moderate in caliber with proximal 30% stenosis. The mid Circumflex has a 30% stenosis.  Right Coronary Artery: Moderate caliber vessel with 30% mid stenosis. There is calcification noted in the proximal and mid vessel.  Left Ventricular Angiogram: LVEF=65%.  Impression:  1. Moderate non-obstructive CAD  2. Normal LV systolic function  3. Non-cardiac chest pain   EKG: SR, no acute ischemic changes  FOLLOW UP PLANS AND APPOINTMENTS Allergies  Allergen Reactions  . Aleve [Naproxen Sodium] Itching and Swelling  . Bactrim [Sulfamethoxazole W-Trimethoprim] Itching    Had a bad reaction, can't remember, worked me over  . Motrin [Ibuprofen] Itching and Swelling  . Penicillins Hives and Itching  . Ultram [Tramadol]     Unknown - on list of allergies, but dog takes     Medication List         ALPRAZolam 0.5 MG tablet  Commonly known as:  XANAX  Take  0.25-0.5 mg by mouth at bedtime as needed for sleep.     atorvastatin 40 MG tablet  Commonly known as:  LIPITOR  Take 40 mg by mouth daily.     cephALEXin 750 MG capsule  Commonly known as:  KEFLEX  Take 750 mg by mouth daily as needed (for gout).     colchicine 0.6 MG tablet  Take 0.6 mg by mouth 2 (two) times daily as needed (for gout flares).     diclofenac sodium 1 % Gel  Commonly known as:  VOLTAREN  Apply 2 g topically 2 (two) times daily as needed (for pain). Apply to knees, ankles, and hands, for arthritis     dipyridamole-aspirin 200-25 MG per 12 hr capsule  Commonly known as:  AGGRENOX  Take 1 capsule by mouth 2 (two) times daily.     HYDROcodone-acetaminophen 10-325 MG per tablet  Commonly known as:  NORCO  Take 0.5-1 tablets by mouth every 6 (six) hours as needed for pain.     lisinopril 40 MG tablet  Commonly known as:  PRINIVIL,ZESTRIL  Take 40 mg by mouth daily.     methocarbamol 500 MG tablet  Commonly known as:  ROBAXIN  Take 500 mg by mouth 4 (four) times daily as needed (for muscle spasms).     omeprazole 20 MG capsule  Commonly known as:  PRILOSEC  Take 20 mg by mouth daily.        Discharge Orders   Future Appointments Provider Department Dept Phone   04/09/2013 11:30 AM Beatrice Lecher, PA-C [facility] Main Office [place]) [phone]   Future Orders Complete By Expires   Diet - low sodium heart healthy  As directed    Diet Carb Modified  As directed    Increase activity slowly  As directed      Follow-up Information   Follow up with Tereso Newcomer, PA-C On 04/09/2013. (at 11:30 am.)    Specialty:  Physician Assistant   Contact information:   [address] [place] [address] [phone]       BRING ALL MEDICATIONS WITH YOU TO FOLLOW UP APPOINTMENTS  Time spent with patient to include physician time: 38 min Signed: Theodore Demark, PA-C 03/26/2013, 11:37 AM Co-Sign MD

## 2013-03-26 NOTE — Progress Notes (Signed)
TELEMETRY: Reviewed telemetry pt in NSR: Filed Vitals:   03/25/13 1800 03/25/13 2000 03/26/13 0010 03/26/13 0453  BP: 133/46 127/58 124/42 132/58  Pulse: 73 70 68 64  Temp:  98.2 F (36.8 C) 97.5 F (36.4 C) 97.2 F (36.2 C)  TempSrc:  Oral Oral Oral  Resp:  16 14 16   Weight:   168 lb 10.4 oz (76.5 kg)   SpO2: 99% 97% 97% 98%    Intake/Output Summary (Last 24 hours) at 03/26/13 0700 Last data filed at 03/26/13 0458  Gross per 24 hour  Intake    940 ml  Output   1700 ml  Net   -760 ml    SUBJECTIVE Feels well. No further chest pain.  LABS: Basic Metabolic Panel:  Recent Labs  16/10/96 1254  NA 140  K 4.3  CL 106  CO2 23  GLUCOSE 184*  BUN 20  CREATININE 0.89  CALCIUM 9.4   Liver Function Tests:  Recent Labs  03/25/13 1254  AST 17  ALT 13  ALKPHOS 97  BILITOT 0.3  PROT 6.5  ALBUMIN 3.6   No results found for this basename: LIPASE, AMYLASE,  in the last 72 hours CBC:  Recent Labs  03/25/13 1254 03/26/13 0610  WBC 6.7 6.8  NEUTROABS 5.0  --   HGB 13.1 12.3  HCT 39.0 37.5  MCV 92.0 92.4  PLT 190 169   Cardiac Enzymes:  Recent Labs  03/25/13 1254  TROPONINI <0.30   BNP: No components found with this basename: POCBNP,  D-Dimer: No results found for this basename: DDIMER,  in the last 72 hours Hemoglobin A1C:  Recent Labs  03/25/13 1901  HGBA1C 6.1*   Fasting Lipid Panel: No results found for this basename: CHOL, HDL, LDLCALC, TRIG, CHOLHDL, LDLDIRECT,  in the last 72 hours Thyroid Function Tests:  Recent Labs  03/25/13 1901  TSH 0.548   Radiology/Studies:  Dg Chest 2 View  03/25/2013   CLINICAL DATA:  Chest pain, shortness of breath  EXAM: CHEST  2 VIEW  COMPARISON:  03/12/2012  FINDINGS: Lungs are essentially clear. No focal consolidation. No pleural effusion or pneumothorax.  The heart is normal in size.  Degenerative changes of the visualized thoracolumbar spine.  Cholecystectomy clips.  IMPRESSION: No evidence of acute  cardiopulmonary disease.   Electronically Signed   By: Charline Bills M.D.   On: 03/25/2013 15:11    PHYSICAL EXAM General: Well developed, well nourished, in no acute distress. Head: Normal Neck: Negative for carotid bruits. JVD not elevated. Lungs: Clear bilaterally to auscultation without wheezes, rales, or rhonchi. Breathing is unlabored. Heart: RRR S1 S2 without murmurs, rubs, or gallops.  Abdomen: Soft, non-tender, non-distended with normoactive bowel sounds. No hepatomegaly. No rebound/guarding. No obvious abdominal masses. Extremities: No clubbing, cyanosis or edema.  Distal pedal pulses are 2+ and equal bilaterally. Moderate bruising at radial cath site. Soft. Good capillary refill. Neuro: Alert and oriented X 3. Moves all extremities spontaneously. Psych:  Responds to questions appropriately with a normal affect.  ASSESSMENT AND PLAN: 1. Chest pain. troponins normal. Cardiac cath with moderate nonobstructive disease. Recommend antireflux treatment. Risk factor modification. Stable for DC today. 2. HTN controlled. 3. Hyperlipidemia. Recommend continued statin Rx.  Principal Problem:   Unstable angina Active Problems:   Paraplegia, spastic congenital   Hereditary spastic paraparesis   Chest pain   Dyslipidemia   HTN (hypertension)   Gout    Signed, Sakeenah Valcarcel [place] MD,FACC 03/26/2013 7:04 AM

## 2013-03-26 NOTE — Progress Notes (Signed)
TELEMETRY: Reviewed telemetry pt in NSR: Filed Vitals:   03/25/13 1800 03/25/13 2000 03/26/13 0010 03/26/13 0453  BP: 133/46 127/58 124/42 132/58  Pulse: 73 70 68 64  Temp:  98.2 F (36.8 C) 97.5 F (36.4 C) 97.2 F (36.2 C)  TempSrc:  Oral Oral Oral  Resp:  16 14 16   Weight:   168 lb 10.4 oz (76.5 kg)   SpO2: 99% 97% 97% 98%    Intake/Output Summary (Last 24 hours) at 03/26/13 0706 Last data filed at 03/26/13 0458  Gross per 24 hour  Intake    940 ml  Output   1700 ml  Net   -760 ml    SUBJECTIVE Feels well. No further chest pain.  LABS: Basic Metabolic Panel:  Recent Labs  16/10/96 1254  NA 140  K 4.3  CL 106  CO2 23  GLUCOSE 184*  BUN 20  CREATININE 0.89  CALCIUM 9.4   Liver Function Tests:  Recent Labs  03/25/13 1254  AST 17  ALT 13  ALKPHOS 97  BILITOT 0.3  PROT 6.5  ALBUMIN 3.6   No results found for this basename: LIPASE, AMYLASE,  in the last 72 hours CBC:  Recent Labs  03/25/13 1254 03/26/13 0610  WBC 6.7 6.8  NEUTROABS 5.0  --   HGB 13.1 12.3  HCT 39.0 37.5  MCV 92.0 92.4  PLT 190 169   Cardiac Enzymes:  Recent Labs  03/25/13 1254  TROPONINI <0.30   BNP: No components found with this basename: POCBNP,  D-Dimer: No results found for this basename: DDIMER,  in the last 72 hours Hemoglobin A1C:  Recent Labs  03/25/13 1901  HGBA1C 6.1*   Fasting Lipid Panel: No results found for this basename: CHOL, HDL, LDLCALC, TRIG, CHOLHDL, LDLDIRECT,  in the last 72 hours Thyroid Function Tests:  Recent Labs  03/25/13 1901  TSH 0.548   Radiology/Studies:  Dg Chest 2 View  03/25/2013   CLINICAL DATA:  Chest pain, shortness of breath  EXAM: CHEST  2 VIEW  COMPARISON:  03/12/2012  FINDINGS: Lungs are essentially clear. No focal consolidation. No pleural effusion or pneumothorax.  The heart is normal in size.  Degenerative changes of the visualized thoracolumbar spine.  Cholecystectomy clips.  IMPRESSION: No evidence of acute  cardiopulmonary disease.   Electronically Signed   By: Charline Bills M.D.   On: 03/25/2013 15:11    PHYSICAL EXAM General: Well developed, well nourished, in no acute distress. Head: Normal Neck: Negative for carotid bruits. JVD not elevated. Lungs: Clear bilaterally to auscultation without wheezes, rales, or rhonchi. Breathing is unlabored. Heart: RRR S1 S2 without murmurs, rubs, or gallops.  Abdomen: Soft, non-tender, non-distended with normoactive bowel sounds. No hepatomegaly. No rebound/guarding. No obvious abdominal masses. Extremities: No clubbing, cyanosis or edema.  Distal pedal pulses are 2+ and equal bilaterally. Moderate bruising at radial cath site. Soft. Good capillary refill. Neuro: Alert and oriented X 3. Moves all extremities spontaneously. Psych:  Responds to questions appropriately with a normal affect.  ASSESSMENT AND PLAN: 1. Chest pain. troponins normal. Cardiac cath with moderate nonobstructive disease. Recommend antireflux treatment. Risk factor modification. Stable for DC today. Follow up in our office 2 weeks.  2. HTN controlled. 3. Hyperlipidemia. Recommend continued statin Rx.  Principal Problem:   Unstable angina Active Problems:   Paraplegia, spastic congenital   Hereditary spastic paraparesis   Chest pain   Dyslipidemia   HTN (hypertension)   Gout    Signed, Theron Arista  [place] MD,FACC 03/26/2013 7:06 AM

## 2013-03-26 NOTE — Discharge Summary (Signed)
Patient seen and examined and history reviewed. Agree with above findings and plan. See my earlier rounding note.  Theron Arista JordanMD 03/26/2013 2:20 PM

## 2013-04-09 ENCOUNTER — Ambulatory Visit (INDEPENDENT_AMBULATORY_CARE_PROVIDER_SITE_OTHER): Payer: Medicare Other | Admitting: Physician Assistant

## 2013-04-09 ENCOUNTER — Encounter: Payer: Self-pay | Admitting: Physician Assistant

## 2013-04-09 VITALS — BP 143/57 | HR 74 | Ht 62.0 in | Wt 168.3 lb

## 2013-04-09 DIAGNOSIS — I2510 Atherosclerotic heart disease of native coronary artery without angina pectoris: Secondary | ICD-10-CM | POA: Diagnosis not present

## 2013-04-09 DIAGNOSIS — K219 Gastro-esophageal reflux disease without esophagitis: Secondary | ICD-10-CM

## 2013-04-09 DIAGNOSIS — I10 Essential (primary) hypertension: Secondary | ICD-10-CM

## 2013-04-09 DIAGNOSIS — R079 Chest pain, unspecified: Secondary | ICD-10-CM | POA: Diagnosis not present

## 2013-04-09 DIAGNOSIS — E785 Hyperlipidemia, unspecified: Secondary | ICD-10-CM

## 2013-04-09 NOTE — Progress Notes (Signed)
[address] [place], [place]  [address] Phone: [phone] Fax:  [phone]  Date:  04/09/2013   ID:  Vicki Stein, DOB 08/02/1927, MRN [id number]  PCP:  Lenora Boys, MD  Cardiologist:  Dr. Peter [place]     History of Present Illness: Vicki Stein is a 79 y.o. female who returns for follow up after a recent admission to the hospital.   She has a history of HTN, HL, prior TIAs, gout.  She was admitted 9/18-9/19 after presenting to the hospital with chest pain concerning for progressive angina. She ruled out for myocardial infarction by enzymes. LHC demonstrated moderate nonobstructive CAD and normal LV function. Medical therapy was recommended.  Her symptoms occurred with exertion prior to admission.  She has congenital paraplegia and sometimes uses a walker to ambulate.  She has a hx of GERD.  Also, notes some wheezing at times.  She denies recurrent CP.  No significant dyspnea.  No orthopnea, PND, edema.  No syncope.    Labs (9/14):   K 4.1, creatinine 0.86, ALT 13, albumin 3.3, Hgb 12.3, TSH 0.548  Wt Readings from Last 3 Encounters:  04/09/13 168 lb 4.8 oz (76.34 kg)  03/26/13 168 lb 10.4 oz (76.5 kg)  03/26/13 168 lb 10.4 oz (76.5 kg)     Past Medical History  Diagnosis Date  . Hypertension   . Hyperlipemia   . High cholesterol   . Back pain   . Migraines   . Cerebrovascular disease   . Gout   . Reflux   . Paraplegia, spastic congenital     inherited .   Marland Kitchen Movement disorder   . Coronary artery disease     a. LHC (9/14):  pLM 30, oD1 50, D2 40-50, pRI 30, mCFX 30, mRCA 30, EF 65%    Current Outpatient Prescriptions  Medication Sig Dispense Refill  . ALPRAZolam (XANAX) 0.5 MG tablet Take 0.25-0.5 mg by mouth at bedtime as needed for sleep.      Marland Kitchen atorvastatin (LIPITOR) 40 MG tablet Take 40 mg by mouth daily.      . cephALEXin (KEFLEX) 750 MG capsule Take 750 mg by mouth daily as needed (for gout).      . colchicine 0.6 MG tablet Take 0.6 mg by  mouth 2 (two) times daily as needed (for gout flares).      . diclofenac sodium (VOLTAREN) 1 % GEL Apply 2 g topically 2 (two) times daily as needed (for pain). Apply to knees, ankles, and hands, for arthritis      . dipyridamole-aspirin (AGGRENOX) 200-25 MG per 12 hr capsule Take 1 capsule by mouth 2 (two) times daily.      Marland Kitchen HYDROcodone-acetaminophen (NORCO) 10-325 MG per tablet Take 0.5-1 tablets by mouth every 6 (six) hours as needed for pain.      Marland Kitchen lisinopril (PRINIVIL,ZESTRIL) 40 MG tablet Take 40 mg by mouth daily.      . methocarbamol (ROBAXIN) 500 MG tablet Take 500 mg by mouth 4 (four) times daily as needed (for muscle spasms).      Marland Kitchen omeprazole (PRILOSEC) 20 MG capsule Take 20 mg by mouth daily.       No current facility-administered medications for this visit.    Allergies:    Allergies  Allergen Reactions  . Aleve [Naproxen Sodium] Itching and Swelling  . Bactrim [Sulfamethoxazole-Trimethoprim] Itching    Had a bad reaction, can't remember, worked me over  . Motrin [Ibuprofen] Itching and  Swelling  . Penicillins Hives and Itching  . Ultram [Tramadol]     Unknown - on list of allergies, but dog takes    Social History:  The patient  reports that she has never smoked. She has never used smokeless tobacco. She reports that she does not drink alcohol or use illicit drugs.   Family History:  The patient's family history includes Heart Problems in her father and mother; Heart attack in her son.   ROS:  Please see the history of present illness.      All other systems reviewed and negative.   PHYSICAL EXAM: VS:  BP 143/57  Pulse 74  Ht 5\' 2"  (1.575 m)  Wt 168 lb 4.8 oz (76.34 kg)  BMI 30.77 kg/m2 Well nourished, well developed female sitting in a wheelchair in no acute distress HEENT: normal Neck: no JVD at 90 Cardiac:  normal S1, S2; RRR; no murmur Lungs:  clear to auscultation bilaterally, no wheezing, rhonchi or rales Abd: soft, nontender, no hepatomegaly Ext: no  edema Skin: warm and dry Neuro:  CNs 2-12 intact, no focal abnormalities noted  EKG:  NSR, HR 74, no acute changes     ASSESSMENT AND PLAN:  1. Chest Pain: Etiology not entirely clear. We discussed the possibility of microvascular angina. However, she did not have slow flow documented on cardiac catheterization. I think this is less likely. I did offer her a trial of nitrates but she decided against this. Other potential causes of her symptoms include acid reflux, musculoskeletal chest pain and possibly reactive airway disease. She can follow up with her PCP for further management. 2. CAD: Continue medical therapy. Continue aspirin (Aggrenox) and statin. 3. Hypertension: Controlled. 4. Hyperlipidemia: Continue statin. 5. GERD: Continue PPI. 6. History of TIA: Continue Aggrenox. 7. Disposition: Followup with Dr. [place] in 3 months.  Signed, Tereso Newcomer, PA-C  04/09/2013 11:58 AM

## 2013-04-09 NOTE — Patient Instructions (Addendum)
Schedule follow up with Dr. Peter [place] in 3 months.   NO CHANGES WERE MADE TODAY

## 2013-05-13 ENCOUNTER — Other Ambulatory Visit: Payer: Self-pay

## 2013-05-31 DIAGNOSIS — M109 Gout, unspecified: Secondary | ICD-10-CM | POA: Diagnosis not present

## 2013-05-31 DIAGNOSIS — E7800 Pure hypercholesterolemia, unspecified: Secondary | ICD-10-CM | POA: Diagnosis not present

## 2013-05-31 DIAGNOSIS — I10 Essential (primary) hypertension: Secondary | ICD-10-CM | POA: Diagnosis not present

## 2013-05-31 DIAGNOSIS — G114 Hereditary spastic paraplegia: Secondary | ICD-10-CM | POA: Diagnosis not present

## 2013-05-31 DIAGNOSIS — R7309 Other abnormal glucose: Secondary | ICD-10-CM | POA: Diagnosis not present

## 2013-07-05 ENCOUNTER — Other Ambulatory Visit: Payer: Self-pay

## 2013-07-05 DIAGNOSIS — Z1231 Encounter for screening mammogram for malignant neoplasm of breast: Secondary | ICD-10-CM

## 2013-07-30 ENCOUNTER — Ambulatory Visit
Admission: RE | Admit: 2013-07-30 | Discharge: 2013-07-30 | Disposition: A | Discharge status: Home / Self Care | Payer: Medicare Other | Source: Ambulatory Visit

## 2013-07-30 DIAGNOSIS — Z1231 Encounter for screening mammogram for malignant neoplasm of breast: Secondary | ICD-10-CM | POA: Diagnosis not present

## 2013-10-06 DIAGNOSIS — D0460 Carcinoma in situ of skin of unspecified upper limb, including shoulder: Secondary | ICD-10-CM | POA: Diagnosis not present

## 2013-10-06 DIAGNOSIS — L821 Other seborrheic keratosis: Secondary | ICD-10-CM | POA: Diagnosis not present

## 2013-12-01 DIAGNOSIS — Z85828 Personal history of other malignant neoplasm of skin: Secondary | ICD-10-CM | POA: Diagnosis not present

## 2013-12-23 DIAGNOSIS — L97909 Non-pressure chronic ulcer of unspecified part of unspecified lower leg with unspecified severity: Secondary | ICD-10-CM | POA: Diagnosis not present

## 2014-01-06 DIAGNOSIS — R7309 Other abnormal glucose: Secondary | ICD-10-CM | POA: Diagnosis not present

## 2014-01-06 DIAGNOSIS — G459 Transient cerebral ischemic attack, unspecified: Secondary | ICD-10-CM | POA: Diagnosis not present

## 2014-01-06 DIAGNOSIS — K219 Gastro-esophageal reflux disease without esophagitis: Secondary | ICD-10-CM | POA: Diagnosis not present

## 2014-01-06 DIAGNOSIS — E7800 Pure hypercholesterolemia, unspecified: Secondary | ICD-10-CM | POA: Diagnosis not present

## 2014-01-06 DIAGNOSIS — M109 Gout, unspecified: Secondary | ICD-10-CM | POA: Diagnosis not present

## 2014-01-06 DIAGNOSIS — I10 Essential (primary) hypertension: Secondary | ICD-10-CM | POA: Diagnosis not present

## 2014-01-06 DIAGNOSIS — G114 Hereditary spastic paraplegia: Secondary | ICD-10-CM | POA: Diagnosis not present

## 2014-01-06 DIAGNOSIS — M19049 Primary osteoarthritis, unspecified hand: Secondary | ICD-10-CM | POA: Diagnosis not present

## 2014-03-04 DIAGNOSIS — M12819 Other specific arthropathies, not elsewhere classified, unspecified shoulder: Secondary | ICD-10-CM | POA: Diagnosis not present

## 2014-03-04 DIAGNOSIS — M25519 Pain in unspecified shoulder: Secondary | ICD-10-CM | POA: Diagnosis not present

## 2014-04-13 DIAGNOSIS — Z23 Encounter for immunization: Secondary | ICD-10-CM | POA: Diagnosis not present

## 2014-06-16 ENCOUNTER — Encounter (HOSPITAL_COMMUNITY): Payer: Self-pay | Admitting: Cardiovascular Disease

## 2014-06-27 DIAGNOSIS — M25512 Pain in left shoulder: Secondary | ICD-10-CM | POA: Diagnosis not present

## 2014-06-27 DIAGNOSIS — M12811 Other specific arthropathies, not elsewhere classified, right shoulder: Secondary | ICD-10-CM | POA: Diagnosis not present

## 2014-07-12 DIAGNOSIS — M25511 Pain in right shoulder: Secondary | ICD-10-CM | POA: Diagnosis not present

## 2014-07-12 DIAGNOSIS — M19011 Primary osteoarthritis, right shoulder: Secondary | ICD-10-CM | POA: Diagnosis not present

## 2014-07-12 DIAGNOSIS — M12811 Other specific arthropathies, not elsewhere classified, right shoulder: Secondary | ICD-10-CM | POA: Diagnosis not present

## 2014-07-14 DIAGNOSIS — E780 Pure hypercholesterolemia: Secondary | ICD-10-CM | POA: Diagnosis not present

## 2014-07-14 DIAGNOSIS — R7309 Other abnormal glucose: Secondary | ICD-10-CM | POA: Diagnosis not present

## 2014-07-14 DIAGNOSIS — G114 Hereditary spastic paraplegia: Secondary | ICD-10-CM | POA: Diagnosis not present

## 2014-07-14 DIAGNOSIS — M12811 Other specific arthropathies, not elsewhere classified, right shoulder: Secondary | ICD-10-CM | POA: Diagnosis not present

## 2014-07-14 DIAGNOSIS — G459 Transient cerebral ischemic attack, unspecified: Secondary | ICD-10-CM | POA: Diagnosis not present

## 2014-07-14 DIAGNOSIS — I10 Essential (primary) hypertension: Secondary | ICD-10-CM | POA: Diagnosis not present

## 2014-08-09 DIAGNOSIS — E875 Hyperkalemia: Secondary | ICD-10-CM | POA: Diagnosis not present

## 2014-08-09 DIAGNOSIS — K219 Gastro-esophageal reflux disease without esophagitis: Secondary | ICD-10-CM | POA: Diagnosis not present

## 2014-08-09 DIAGNOSIS — R7309 Other abnormal glucose: Secondary | ICD-10-CM | POA: Diagnosis not present

## 2014-08-09 DIAGNOSIS — G114 Hereditary spastic paraplegia: Secondary | ICD-10-CM | POA: Diagnosis not present

## 2014-08-09 DIAGNOSIS — M1000 Idiopathic gout, unspecified site: Secondary | ICD-10-CM | POA: Diagnosis not present

## 2014-08-09 DIAGNOSIS — M12811 Other specific arthropathies, not elsewhere classified, right shoulder: Secondary | ICD-10-CM | POA: Diagnosis not present

## 2014-08-09 DIAGNOSIS — K3184 Gastroparesis: Secondary | ICD-10-CM | POA: Diagnosis not present

## 2014-08-09 DIAGNOSIS — G459 Transient cerebral ischemic attack, unspecified: Secondary | ICD-10-CM | POA: Diagnosis not present

## 2014-08-09 DIAGNOSIS — E780 Pure hypercholesterolemia: Secondary | ICD-10-CM | POA: Diagnosis not present

## 2014-08-09 DIAGNOSIS — K5730 Diverticulosis of large intestine without perforation or abscess without bleeding: Secondary | ICD-10-CM | POA: Diagnosis not present

## 2014-09-23 DIAGNOSIS — M25511 Pain in right shoulder: Secondary | ICD-10-CM | POA: Diagnosis not present

## 2014-09-23 DIAGNOSIS — M12811 Other specific arthropathies, not elsewhere classified, right shoulder: Secondary | ICD-10-CM | POA: Diagnosis not present

## 2014-09-23 DIAGNOSIS — M25512 Pain in left shoulder: Secondary | ICD-10-CM | POA: Diagnosis not present

## 2015-01-02 ENCOUNTER — Other Ambulatory Visit: Payer: Self-pay

## 2015-01-10 DIAGNOSIS — R609 Edema, unspecified: Secondary | ICD-10-CM | POA: Diagnosis not present

## 2015-01-10 DIAGNOSIS — I10 Essential (primary) hypertension: Secondary | ICD-10-CM | POA: Diagnosis not present

## 2015-04-13 DIAGNOSIS — Z23 Encounter for immunization: Secondary | ICD-10-CM | POA: Diagnosis not present

## 2015-07-13 DIAGNOSIS — G459 Transient cerebral ischemic attack, unspecified: Secondary | ICD-10-CM | POA: Diagnosis not present

## 2015-07-13 DIAGNOSIS — E7800 Pure hypercholesterolemia, unspecified: Secondary | ICD-10-CM | POA: Diagnosis not present

## 2015-07-13 DIAGNOSIS — I10 Essential (primary) hypertension: Secondary | ICD-10-CM | POA: Diagnosis not present

## 2015-07-13 DIAGNOSIS — R7309 Other abnormal glucose: Secondary | ICD-10-CM | POA: Diagnosis not present

## 2015-09-03 ENCOUNTER — Emergency Department (HOSPITAL_COMMUNITY)
Admission: EM | Admit: 2015-09-03 | Discharge: 2015-09-03 | Disposition: A | Discharge status: Home / Self Care | Payer: Medicare Other | Attending: Emergency Medicine | Admitting: Emergency Medicine

## 2015-09-03 ENCOUNTER — Encounter (HOSPITAL_COMMUNITY): Payer: Self-pay | Admitting: Emergency Medicine

## 2015-09-03 ENCOUNTER — Emergency Department (HOSPITAL_COMMUNITY): Payer: Medicare Other

## 2015-09-03 DIAGNOSIS — Z8673 Personal history of transient ischemic attack (TIA), and cerebral infarction without residual deficits: Secondary | ICD-10-CM | POA: Diagnosis not present

## 2015-09-03 DIAGNOSIS — K279 Peptic ulcer, site unspecified, unspecified as acute or chronic, without hemorrhage or perforation: Secondary | ICD-10-CM | POA: Insufficient documentation

## 2015-09-03 DIAGNOSIS — I2510 Atherosclerotic heart disease of native coronary artery without angina pectoris: Secondary | ICD-10-CM | POA: Insufficient documentation

## 2015-09-03 DIAGNOSIS — I10 Essential (primary) hypertension: Secondary | ICD-10-CM | POA: Insufficient documentation

## 2015-09-03 DIAGNOSIS — M549 Dorsalgia, unspecified: Secondary | ICD-10-CM | POA: Diagnosis not present

## 2015-09-03 DIAGNOSIS — Z9889 Other specified postprocedural states: Secondary | ICD-10-CM | POA: Insufficient documentation

## 2015-09-03 DIAGNOSIS — R0789 Other chest pain: Secondary | ICD-10-CM | POA: Diagnosis not present

## 2015-09-03 DIAGNOSIS — R0602 Shortness of breath: Secondary | ICD-10-CM | POA: Diagnosis not present

## 2015-09-03 DIAGNOSIS — Z880 Allergy status to penicillin: Secondary | ICD-10-CM | POA: Diagnosis not present

## 2015-09-03 DIAGNOSIS — M1990 Unspecified osteoarthritis, unspecified site: Secondary | ICD-10-CM | POA: Insufficient documentation

## 2015-09-03 DIAGNOSIS — E7800 Pure hypercholesterolemia, unspecified: Secondary | ICD-10-CM | POA: Insufficient documentation

## 2015-09-03 DIAGNOSIS — R110 Nausea: Secondary | ICD-10-CM | POA: Diagnosis not present

## 2015-09-03 DIAGNOSIS — Z79899 Other long term (current) drug therapy: Secondary | ICD-10-CM | POA: Diagnosis not present

## 2015-09-03 DIAGNOSIS — E785 Hyperlipidemia, unspecified: Secondary | ICD-10-CM | POA: Diagnosis not present

## 2015-09-03 DIAGNOSIS — K219 Gastro-esophageal reflux disease without esophagitis: Secondary | ICD-10-CM | POA: Diagnosis not present

## 2015-09-03 DIAGNOSIS — Z8669 Personal history of other diseases of the nervous system and sense organs: Secondary | ICD-10-CM | POA: Diagnosis not present

## 2015-09-03 DIAGNOSIS — R079 Chest pain, unspecified: Secondary | ICD-10-CM | POA: Diagnosis present

## 2015-09-03 LAB — CBC
HCT: 39.4 % (ref 36.0–46.0)
Hemoglobin: 12.8 g/dL (ref 12.0–15.0)
MCH: 30.4 pg (ref 26.0–34.0)
MCHC: 32.5 g/dL (ref 30.0–36.0)
MCV: 93.6 fL (ref 78.0–100.0)
PLATELETS: 196 10*3/uL (ref 150–400)
RBC: 4.21 MIL/uL (ref 3.87–5.11)
RDW: 13.9 % (ref 11.5–15.5)
WBC: 6.8 10*3/uL (ref 4.0–10.5)

## 2015-09-03 LAB — COMPREHENSIVE METABOLIC PANEL
ALK PHOS: 100 U/L (ref 38–126)
ALT: 16 U/L (ref 14–54)
ANION GAP: 10 (ref 5–15)
AST: 22 U/L (ref 15–41)
Albumin: 4.1 g/dL (ref 3.5–5.0)
BILIRUBIN TOTAL: 0.6 mg/dL (ref 0.3–1.2)
BUN: 21 mg/dL — ABNORMAL HIGH (ref 6–20)
CALCIUM: 9.2 mg/dL (ref 8.9–10.3)
CO2: 22 mmol/L (ref 22–32)
CREATININE: 1.02 mg/dL — AB (ref 0.44–1.00)
Chloride: 109 mmol/L (ref 101–111)
GFR calc non Af Amer: 49 mL/min — ABNORMAL LOW (ref >60)
GFR, EST AFRICAN AMERICAN: 56 mL/min — AB (ref >60)
Glucose, Bld: 127 mg/dL — ABNORMAL HIGH (ref 65–99)
Potassium: 4.2 mmol/L (ref 3.5–5.1)
Sodium: 141 mmol/L (ref 135–145)
TOTAL PROTEIN: 6.8 g/dL (ref 6.5–8.1)

## 2015-09-03 LAB — I-STAT TROPONIN, ED
TROPONIN I, POC: 0 ng/mL (ref 0.00–0.08)
Troponin i, poc: 0.01 ng/mL (ref 0.00–0.08)

## 2015-09-03 LAB — LIPASE, BLOOD: Lipase: 27 U/L (ref 11–51)

## 2015-09-03 MED ORDER — SUCRALFATE 1 GM/10ML PO SUSP: 1.0000 g | Freq: Three times a day (TID) | ORAL | Status: DC

## 2015-09-03 MED ORDER — ACETAMINOPHEN 325 MG PO TABS
650.0000 mg | ORAL_TABLET | Freq: Once | ORAL | Status: AC
  Administered 2015-09-03: 650 mg via ORAL
  Filled 2015-09-03: qty 2

## 2015-09-03 NOTE — Discharge Instructions (Signed)
Food Choices for Peptic Ulcer Disease °When you have peptic ulcer disease, the foods you eat and your eating habits are very important. Choosing the right foods can help ease the discomfort of peptic ulcer disease. °WHAT GENERAL GUIDELINES DO I NEED TO FOLLOW? °· Choose fruits, vegetables, whole grains, and low-fat meat, fish, and poultry.   °· Keep a food diary to identify foods that cause symptoms. °· Avoid foods that cause irritation or pain. These may be different for different people. °· Eat frequent small meals instead of three large meals each day. The pain may be worse when your stomach is empty.  °· Avoid eating close to bedtime. °WHAT FOODS ARE NOT RECOMMENDED? °The following are some foods and drinks that may worsen your symptoms: °· Black, white, and red pepper. °· Hot sauce. °· Chili peppers. °· Chili powder. °· Chocolate and cocoa.    °· Alcohol. °· Tea, coffee, and cola (regular and decaffeinated). °The items listed above may not be a complete list of foods and beverages to avoid. Contact your dietitian for more information. °  °This information is not intended to replace advice given to you by your health care provider. Make sure you discuss any questions you have with your health care provider. °  °Document Released: 09/16/2011 Document Revised: 06/29/2013 Document Reviewed: 04/28/2013 °Elsevier Interactive Patient Education ©2016 [employer] °Peptic Ulcer °A peptic ulcer is a sore in the lining of your esophagus (esophageal ulcer), stomach (gastric ulcer), or in the first part of your small intestine (duodenal ulcer). The ulcer causes erosion into the deeper tissue. °CAUSES  °Normally, the lining of the stomach and the small intestine protects itself from the acid that digests food. The protective lining can be damaged by: °· An infection caused by a bacterium called Helicobacter pylori (H. pylori). °· Regular use of nonsteroidal anti-inflammatory drugs (NSAIDs), such as ibuprofen or  aspirin. °· Smoking tobacco. °Other risk factors include being older than 50, drinking alcohol excessively, and having a family history of ulcer disease.  °SYMPTOMS  °· Burning pain or gnawing in the area between the chest and the belly button. °· Heartburn. °· Nausea and vomiting. °· Bloating. °The pain can be worse on an empty stomach and at night. If the ulcer results in bleeding, it can cause: °· Black, tarry stools. °· Vomiting of bright red blood. °· Vomiting of coffee-ground-looking materials. °DIAGNOSIS  °A diagnosis is usually made based upon your history and an exam. Other tests and procedures may be performed to find the cause of the ulcer. Finding a cause will help determine the best treatment. Tests and procedures may include: °· Blood tests, stool tests, or breath tests to check for the bacterium H. pylori. °· An upper gastrointestinal (GI) series of the esophagus, stomach, and small intestine. °· An endoscopy to examine the esophagus, stomach, and small intestine. °· A biopsy. °TREATMENT  °Treatment may include: °· Eliminating the cause of the ulcer, such as smoking, NSAIDs, or alcohol. °· Medicines to reduce the amount of acid in your digestive tract. °· Antibiotic medicines if the ulcer is caused by the H. pylori bacterium. °· An upper endoscopy to treat a bleeding ulcer. °· Surgery if the bleeding is severe or if the ulcer created a hole somewhere in the digestive system. °HOME CARE INSTRUCTIONS  °· Avoid tobacco, alcohol, and caffeine. Smoking can increase the acid in the stomach, and continued smoking will impair the healing of ulcers. °· Avoid foods and drinks that seem to cause discomfort or aggravate your ulcer. °·   Only take medicines as directed by your caregiver. Do not substitute over-the-counter medicines for prescription medicines without talking to your caregiver. °· Keep any follow-up appointments and tests as directed. °SEEK MEDICAL CARE IF:  °· Your do not improve within 7 days of  starting treatment. °· You have ongoing indigestion or heartburn. °SEEK IMMEDIATE MEDICAL CARE IF:  °· You have sudden, sharp, or persistent abdominal pain. °· You have bloody or dark black, tarry stools. °· You vomit blood or vomit that looks like coffee grounds. °· You become light-headed, weak, or feel faint. °· You become sweaty or clammy. °MAKE SURE YOU:  °· Understand these instructions. °· Will watch your condition. °· Will get help right away if you are not doing well or get worse. °  °This information is not intended to replace advice given to you by your health care provider. Make sure you discuss any questions you have with your health care provider. °  °Document Released: 06/21/2000 Document Revised: 07/15/2014 Document Reviewed: 01/22/2012 °Elsevier Interactive Patient Education ©2016 Elsevier Inc. ° °

## 2015-09-03 NOTE — ED Notes (Addendum)
Pt states that she ate barbeque about 4 days ago and ever since then she had had "pressure and nausea" in the center of her chest that radiates to her back. She equates this to her acid reflux, but she states it is far more severe than normal. Pt also has complaints of gas

## 2015-09-03 NOTE — ED Provider Notes (Signed)
CSN: [id number]     Arrival date & time 09/03/15  0114 History   First MD Initiated Contact with Patient 09/03/15 0210     Chief Complaint  Patient presents with  . Gastroesophageal Reflux  . Chest Pain    pressure in center of chest      (Consider location/radiation/quality/duration/timing/severity/associated sxs/prior Treatment) HPI Comments: The patient is an 82 year old female history of hypertension, dyslipidemia, coronary artery disease, CVA, and esophageal reflux who presents to the emergency department for evaluation of chest discomfort. Patient states that her symptoms began 3 weeks ago after eating some expired barbecue that she had In the freezer. She states that she had a pressure-like sensation in her lower chest followed by a few episodes of belching. The patient has noticed her pain waxing and waning since this time. Patient states that the pain will sometimes travel through to her back and her shoulder blades when present and make her feel short of breath. It is aggravated with eating or drinking, though she feels like cool fluids will soothe her pain some. Patient states "I feel like my stomach is hot". She states that she has been compliant with her reflux medications. No reported emesis, though patient has felt nauseous at times. No associated fever, syncope, or bowel changes. Abdominal surgical hx notable for cholecystectomy. No PHx of ACS, though patient reports her parents both had an MI in their lifetime.  Patient is a 82 y.o. female presenting with GERD and chest pain. The history is provided by the patient. No language interpreter was used.  Gastroesophageal Reflux Associated symptoms include abdominal pain, chest pain and nausea. Pertinent negatives include no fever or vomiting.  Chest Pain Associated symptoms: abdominal pain, back pain, nausea and shortness of breath   Associated symptoms: no fever and not vomiting     Past Medical History  Diagnosis Date  .  Hypertension   . Hyperlipemia   . High cholesterol   . Back pain   . Migraines   . Cerebrovascular disease   . Gout   . Reflux   . Paraplegia, spastic congenital ([place])     inherited .   Marland Kitchen Movement disorder   . Coronary artery disease     a. LHC (9/14):  pLM 30, oD1 50, D2 40-50, pRI 30, mCFX 30, mRCA 30, EF 65%   Past Surgical History  Procedure Laterality Date  . Cholecystectomy    . Rotator cuff repair    . Left heart catheterization with coronary angiogram N/A 03/25/2013    Procedure: LEFT HEART CATHETERIZATION WITH CORONARY ANGIOGRAM;  Surgeon: Burnell Blanks, MD;  Location: [facility] CATH LAB;  Service: Cardiovascular;  Laterality: N/A;   Family History  Problem Relation Age of Onset  . Heart Problems Mother   . Heart Problems Father   . Heart attack Son    Social History  Substance Use Topics  . Smoking status: Never Smoker   . Smokeless tobacco: Never Used  . Alcohol Use: No   OB History    No data available      Review of Systems  Constitutional: Negative for fever.  Respiratory: Positive for shortness of breath.   Cardiovascular: Positive for chest pain.  Gastrointestinal: Positive for nausea and abdominal pain. Negative for vomiting and abdominal distention.  Musculoskeletal: Positive for back pain.  Neurological: Negative for syncope.  All other systems reviewed and are negative.   Allergies  Aleve; Bactrim; Motrin; Penicillins; and Ultram  Home Medications   Prior to Admission  medications   Medication Sig Start Date End Date Taking? Authorizing Provider  ALPRAZolam Duanne Moron) 0.5 MG tablet Take 0.25-0.5 mg by mouth at bedtime as needed for sleep.   Yes Historical Provider, MD  atorvastatin (LIPITOR) 40 MG tablet Take 40 mg by mouth daily.   Yes Historical Provider, MD  diclofenac sodium (VOLTAREN) 1 % GEL Apply 2 g topically 2 (two) times daily as needed (for pain). Apply to knees, ankles, and hands, for arthritis   Yes Historical Provider, MD   dipyridamole-aspirin (AGGRENOX) 200-25 MG per 12 hr capsule Take 1 capsule by mouth 2 (two) times daily.   Yes Historical Provider, MD  HYDROcodone-acetaminophen (NORCO) 10-325 MG per tablet Take 0.5-1 tablets by mouth every 6 (six) hours as needed for pain.   Yes Historical Provider, MD  lisinopril (PRINIVIL,ZESTRIL) 40 MG tablet Take 40 mg by mouth daily.   Yes Historical Provider, MD  methocarbamol (ROBAXIN) 500 MG tablet Take 500 mg by mouth 4 (four) times daily as needed (for muscle spasms).   Yes Historical Provider, MD  mupirocin cream (BACTROBAN) 2 % Apply 1 application topically 3 (three) times daily.   Yes Historical Provider, MD  omeprazole (PRILOSEC) 40 MG capsule Take 40 mg by mouth daily.   Yes Historical Provider, MD  sucralfate (CARAFATE) 1 GM/10ML suspension Take 10 mLs (1 g total) by mouth 4 (four) times daily -  with meals and at bedtime. 09/03/15   Antonietta Breach, PA-C   BP 149/62 mmHg  Pulse 65  Resp 15  Wt 76.204 kg  SpO2 98%   Physical Exam  Constitutional: She is oriented to person, place, and time. She appears well-developed and well-nourished. No distress.  Nontoxic/nonseptic appearing  HENT:  Head: Normocephalic and atraumatic.  Eyes: Conjunctivae and EOM are normal. No scleral icterus.  Neck: Normal range of motion.  Cardiovascular: Normal rate, regular rhythm and intact distal pulses.   Pulmonary/Chest: Effort normal and breath sounds normal. No respiratory distress. She has no wheezes. She has no rales.  Respirations even and unlabored. Lungs CTAB.  Abdominal: Soft. She exhibits no distension. There is no tenderness. There is no rebound and no guarding.  Soft, nontender abdomen. No masses or rebound. No distension.  Musculoskeletal: Normal range of motion.  Neurological: She is alert and oriented to person, place, and time. She exhibits normal muscle tone. Coordination normal.  Skin: Skin is warm and dry. No rash noted. She is not diaphoretic. No erythema. No  pallor.  Psychiatric: She has a normal mood and affect. Her behavior is normal.  Nursing note and vitals reviewed.   ED Course  Procedures (including critical care time) Labs Review Labs Reviewed  COMPREHENSIVE METABOLIC PANEL - Abnormal; Notable for the following:    Glucose, Bld 127 (*)    BUN 21 (*)    Creatinine, Ser 1.02 (*)    GFR calc non Af Amer 49 (*)    GFR calc Af Amer 56 (*)    All other components within normal limits  CBC  LIPASE, BLOOD  I-STAT TROPOININ, ED  I-STAT TROPOININ, ED    Imaging Review Dg Chest 2 View  09/03/2015  CLINICAL DATA:  Acute onset of central chest pressure and nausea. Initial encounter. EXAM: CHEST  2 VIEW COMPARISON:  Chest radiograph performed 03/25/2013 FINDINGS: The lungs are well-aerated and clear. There is no evidence of focal opacification, pleural effusion or pneumothorax. The heart is normal in size; the mediastinal contour is within normal limits. No acute osseous abnormalities are  seen. Anterior bridging osteophytes are noted along the thoracic spine. IMPRESSION: No acute cardiopulmonary process seen. Electronically Signed   By: Garald Balding M.D.   On: 09/03/2015 02:04   I have personally reviewed and evaluated these images and lab results as part of my medical decision-making.   EKG Interpretation   Date/Time:  Sunday September 03 2015 01:47:18 EST Ventricular Rate:  65 PR Interval:  182 QRS Duration: 94 QT Interval:  390 QTC Calculation: 405 R Axis:   56 Text Interpretation:  Sinus rhythm Low voltage, precordial leads Abnormal  R-wave progression, early transition IVCD resolved from prior; low voltage  Confirmed by HORTON  MD, Loma Sousa ([id number]) on 09/03/2015 1:52:09 AM      MDM   Final diagnoses:  PUD (peptic ulcer disease)    82 year old female presents to the emergency department for evaluation of chest pain and pressure which is aggravated with eating and drinking. Symptoms associated with nausea. They have been  ongoing for 3 weeks since eating expired BBQ.   Patient is well and nontoxic appearing. She has stable vitals. Cardiac workup is reassuring. Troponin negative 2. CXR negative for mediastinal widening to suggest dissection. No PTX, pleural effusion, or PNA. Low suspicion for cardiac etiology, especially given atypical nature of symptoms. Patient does have a history of esophageal reflux. It is more likely that her symptoms are caused by worsening reflux. Patient has maxed out therapy with Prilosec. Will add Carafate and refer to GI for follow up. Return precautions discussed and provided. Patient discharged in satisfactory condition.   Filed Vitals:   09/03/15 0135 09/03/15 0452  BP: 117/97 149/62  Pulse: 65 65  Resp: 18 15  Weight: 76.204 kg   SpO2: 100% 98%     Antonietta Breach, PA-C 09/03/15 [id number]  Merryl Hacker, MD 09/03/15 2324

## 2015-09-28 ENCOUNTER — Other Ambulatory Visit: Payer: Self-pay | Admitting: Gastroenterology

## 2015-09-28 DIAGNOSIS — R1310 Dysphagia, unspecified: Secondary | ICD-10-CM | POA: Diagnosis not present

## 2015-09-28 DIAGNOSIS — G114 Hereditary spastic paraplegia: Secondary | ICD-10-CM | POA: Diagnosis not present

## 2015-09-28 DIAGNOSIS — K219 Gastro-esophageal reflux disease without esophagitis: Secondary | ICD-10-CM | POA: Diagnosis not present

## 2015-09-28 DIAGNOSIS — K3184 Gastroparesis: Secondary | ICD-10-CM | POA: Diagnosis not present

## 2015-10-02 ENCOUNTER — Ambulatory Visit
Admission: RE | Admit: 2015-10-02 | Discharge: 2015-10-02 | Disposition: A | Discharge status: Home / Self Care | Payer: Medicare Other | Source: Ambulatory Visit | Attending: Gastroenterology | Admitting: Gastroenterology

## 2015-10-02 DIAGNOSIS — R1310 Dysphagia, unspecified: Secondary | ICD-10-CM

## 2015-10-02 DIAGNOSIS — K228 Other specified diseases of esophagus: Secondary | ICD-10-CM | POA: Diagnosis not present

## 2015-10-02 DIAGNOSIS — R079 Chest pain, unspecified: Secondary | ICD-10-CM | POA: Diagnosis not present

## 2015-10-11 ENCOUNTER — Encounter (HOSPITAL_COMMUNITY): Payer: Self-pay | Admitting: *Deleted

## 2015-10-21 ENCOUNTER — Emergency Department (HOSPITAL_COMMUNITY): Payer: Medicare Other

## 2015-10-21 ENCOUNTER — Emergency Department (HOSPITAL_COMMUNITY)
Admission: EM | Admit: 2015-10-21 | Discharge: 2015-10-22 | Disposition: A | Discharge status: Home / Self Care | Payer: Medicare Other | Attending: Emergency Medicine | Admitting: Emergency Medicine

## 2015-10-21 ENCOUNTER — Encounter (HOSPITAL_COMMUNITY): Payer: Self-pay | Admitting: Emergency Medicine

## 2015-10-21 DIAGNOSIS — R51 Headache: Secondary | ICD-10-CM | POA: Diagnosis not present

## 2015-10-21 DIAGNOSIS — E7800 Pure hypercholesterolemia, unspecified: Secondary | ICD-10-CM | POA: Insufficient documentation

## 2015-10-21 DIAGNOSIS — I10 Essential (primary) hypertension: Secondary | ICD-10-CM | POA: Insufficient documentation

## 2015-10-21 DIAGNOSIS — Z79899 Other long term (current) drug therapy: Secondary | ICD-10-CM | POA: Diagnosis not present

## 2015-10-21 DIAGNOSIS — Z8673 Personal history of transient ischemic attack (TIA), and cerebral infarction without residual deficits: Secondary | ICD-10-CM | POA: Diagnosis not present

## 2015-10-21 DIAGNOSIS — Z8739 Personal history of other diseases of the musculoskeletal system and connective tissue: Secondary | ICD-10-CM | POA: Diagnosis not present

## 2015-10-21 DIAGNOSIS — I2510 Atherosclerotic heart disease of native coronary artery without angina pectoris: Secondary | ICD-10-CM | POA: Insufficient documentation

## 2015-10-21 DIAGNOSIS — Z9889 Other specified postprocedural states: Secondary | ICD-10-CM | POA: Diagnosis not present

## 2015-10-21 DIAGNOSIS — Z880 Allergy status to penicillin: Secondary | ICD-10-CM | POA: Insufficient documentation

## 2015-10-21 DIAGNOSIS — H9319 Tinnitus, unspecified ear: Secondary | ICD-10-CM | POA: Diagnosis not present

## 2015-10-21 DIAGNOSIS — K219 Gastro-esophageal reflux disease without esophagitis: Secondary | ICD-10-CM | POA: Insufficient documentation

## 2015-10-21 DIAGNOSIS — E785 Hyperlipidemia, unspecified: Secondary | ICD-10-CM | POA: Insufficient documentation

## 2015-10-21 DIAGNOSIS — R440 Auditory hallucinations: Secondary | ICD-10-CM | POA: Insufficient documentation

## 2015-10-21 NOTE — ED Notes (Signed)
Pt reports hearing music and ringing in ears. Pt lives alone and has been calling nephew who lives 2 hours away c/o hammering in her walls, hearing amazing grace and christmas carols. Pt states she has been searching her home for a radio that has been left on. Pt is A & O at this times c/o dull HA

## 2015-10-22 ENCOUNTER — Other Ambulatory Visit: Payer: Self-pay

## 2015-10-22 DIAGNOSIS — R440 Auditory hallucinations: Secondary | ICD-10-CM | POA: Diagnosis not present

## 2015-10-22 LAB — URINE MICROSCOPIC-ADD ON
Bacteria, UA: NONE SEEN
WBC, UA: NONE SEEN WBC/hpf (ref 0–5)

## 2015-10-22 LAB — CBC WITH DIFFERENTIAL/PLATELET
BASOS ABS: 0 10*3/uL (ref 0.0–0.1)
BASOS PCT: 1 %
EOS PCT: 2 %
Eosinophils Absolute: 0.1 10*3/uL (ref 0.0–0.7)
HCT: 36.2 % (ref 36.0–46.0)
Hemoglobin: 12.2 g/dL (ref 12.0–15.0)
Lymphocytes Relative: 19 %
Lymphs Abs: 1.3 10*3/uL (ref 0.7–4.0)
MCH: 30.3 pg (ref 26.0–34.0)
MCHC: 33.7 g/dL (ref 30.0–36.0)
MCV: 90 fL (ref 78.0–100.0)
MONO ABS: 0.6 10*3/uL (ref 0.1–1.0)
Monocytes Relative: 10 %
Neutro Abs: 4.5 10*3/uL (ref 1.7–7.7)
Neutrophils Relative %: 68 %
PLATELETS: 180 10*3/uL (ref 150–400)
RBC: 4.02 MIL/uL (ref 3.87–5.11)
RDW: 13.2 % (ref 11.5–15.5)
WBC: 6.6 10*3/uL (ref 4.0–10.5)

## 2015-10-22 LAB — BASIC METABOLIC PANEL
ANION GAP: 9 (ref 5–15)
BUN: 21 mg/dL — ABNORMAL HIGH (ref 6–20)
CALCIUM: 8.8 mg/dL — AB (ref 8.9–10.3)
CO2: 23 mmol/L (ref 22–32)
Chloride: 108 mmol/L (ref 101–111)
Creatinine, Ser: 0.94 mg/dL (ref 0.44–1.00)
GFR, EST NON AFRICAN AMERICAN: 53 mL/min — AB (ref >60)
Glucose, Bld: 117 mg/dL — ABNORMAL HIGH (ref 65–99)
Potassium: 3.5 mmol/L (ref 3.5–5.1)
SODIUM: 140 mmol/L (ref 135–145)

## 2015-10-22 LAB — URINALYSIS, ROUTINE W REFLEX MICROSCOPIC
Bilirubin Urine: NEGATIVE
GLUCOSE, UA: NEGATIVE mg/dL
KETONES UR: NEGATIVE mg/dL
LEUKOCYTES UA: NEGATIVE
NITRITE: NEGATIVE
PROTEIN: NEGATIVE mg/dL
Specific Gravity, Urine: 1.019 (ref 1.005–1.030)
pH: 6 (ref 5.0–8.0)

## 2015-10-22 NOTE — ED Provider Notes (Signed)
CSN: [id number]     Arrival date & time 10/21/15  2243 History  By signing my name below, I, Hansel Feinstein, attest that this documentation has been prepared under the direction and in the presence of Merryl Hacker, MD. Electronically Signed: Hansel Feinstein, ED Scribe. 10/22/2015. 12:30 AM.    Chief Complaint  Patient presents with  . Hallucinations   The history is provided by the patient. No language interpreter was used.   HPI Comments: Vicki Stein is a 82 y.o. female with h/o HTN, HLD, CAD who presents to the Emergency Department complaining of auditory hallucinations onset a week ago. Pt states she has been intermittently hearing sounds that are not there, like "buzzing" or the song "Amazing Shirlee [place]ick". She also complains of mild HA. No h/o of similar symptoms. Per nephew, the pt has been calling him complaining of her symptoms. Nephew reports the hallucinations seem to be worsened at night, but pt states her symptoms occur throughout the day. Denies visual hallucinations, fever, abdominal pain, nausea, emesis, dysuria, frequency, hematuria, dizziness, visual disturbance. No recent changes in daily medications. Pt has been taking Xanax for 20 years. No h/o cancer.   Past Medical History  Diagnosis Date  . Hypertension   . Hyperlipemia   . High cholesterol   . Back pain   . Migraines   . Cerebrovascular disease   . Gout   . Reflux   . 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   Marland Kitchen Movement disorder   . Coronary artery disease     a. LHC (9/14):  pLM 30, oD1 50, D2 40-50, pRI 30, mCFX 30, mRCA 30, EF 65%   Past Surgical History  Procedure Laterality Date  . Cholecystectomy    . Rotator cuff repair    . Left heart catheterization with coronary angiogram N/A 03/25/2013    Procedure: LEFT HEART CATHETERIZATION WITH CORONARY ANGIOGRAM;  Surgeon: Burnell Blanks, MD;  Location: [facility] CATH LAB;  Service: Cardiovascular;  Laterality: N/A;   Family History  Problem Relation  Age of Onset  . Heart Problems Mother   . Heart Problems Father   . Heart attack Son    Social History  Substance Use Topics  . Smoking status: Never Smoker   . Smokeless tobacco: Never Used  . Alcohol Use: No   OB History    No data available     Review of Systems  Constitutional: Negative for fever.  HENT: Positive for tinnitus.   Eyes: Negative for visual disturbance.  Gastrointestinal: Negative for nausea, vomiting and abdominal pain.  Genitourinary: Negative for dysuria, frequency and hematuria.  Neurological: Positive for headaches. Negative for dizziness.  Psychiatric/Behavioral: Positive for hallucinations ( auditory, no visual).  All other systems reviewed and are negative.  Allergies  Aleve; Bactrim; Motrin; Penicillins; and Ultram  Home Medications   Prior to Admission medications   Medication Sig Start Date End Date Taking? Authorizing Provider  acetaminophen (TYLENOL) 650 MG CR tablet Take 650 mg by mouth every 8 (eight) hours as needed for pain.   Yes Historical Provider, MD  ALPRAZolam Duanne Moron) 0.5 MG tablet Take 0.5 mg by mouth at bedtime as needed for sleep.    Yes Historical Provider, MD  atorvastatin (LIPITOR) 40 MG tablet Take 40 mg by mouth daily.   Yes Historical Provider, MD  diclofenac sodium (VOLTAREN) 1 % GEL Apply 2 g topically 2 (two) times daily as needed (for pain). Apply to knees, ankles, and hands, for  arthritis   Yes Historical Provider, MD  dipyridamole-aspirin (AGGRENOX) 200-25 MG per 12 hr capsule Take 1 capsule by mouth 2 (two) times daily.   Yes Historical Provider, MD  HYDROcodone-acetaminophen (NORCO) 10-325 MG per tablet Take 0.5-1 tablets by mouth every 6 (six) hours as needed for pain.   Yes Historical Provider, MD  lisinopril (PRINIVIL,ZESTRIL) 40 MG tablet Take 40 mg by mouth every morning.    Yes Historical Provider, MD  omeprazole (PRILOSEC) 40 MG capsule Take 40 mg by mouth daily.   Yes Historical Provider, MD  sucralfate  (CARAFATE) 1 GM/10ML suspension Take 10 mLs (1 g total) by mouth 4 (four) times daily -  with meals and at bedtime. Patient not taking: Reported on 10/11/2015 09/03/15   Antonietta Breach, PA-C   BP 163/82 mmHg  Pulse 75  Temp(Src) 97.9 F (36.6 C) (Oral)  Resp 15  SpO2 98% Physical Exam  Constitutional: She is oriented to person, place, and time. She appears well-developed and well-nourished. No distress.  HENT:  Head: Normocephalic and atraumatic.  Cardiovascular: Normal rate, regular rhythm and normal heart sounds.   No murmur heard. Pulmonary/Chest: Effort normal and breath sounds normal. No respiratory distress. She has no wheezes.  Abdominal: Soft. Bowel sounds are normal. There is no tenderness. There is no rebound.  Musculoskeletal: She exhibits no edema.  Neurological: She is alert and oriented to person, place, and time.  CN 2-12 intact, 5/5 strength in all 4 extremities, fluent speech, good insight  Skin: Skin is warm and dry.  Psychiatric: She has a normal mood and affect.  Nursing note and vitals reviewed.   ED Course  Procedures (including critical care time) DIAGNOSTIC STUDIES: Oxygen Saturation is 98% on RA, normal by my interpretation.    COORDINATION OF CARE: 12:29 AM Discussed treatment plan with pt at bedside which includes head CT and pt agreed to plan.   Labs Review Labs Reviewed  BASIC METABOLIC PANEL - Abnormal; Notable for the following:    Glucose, Bld 117 (*)    BUN 21 (*)    Calcium 8.8 (*)    GFR calc non Af Amer 53 (*)    All other components within normal limits  URINALYSIS, ROUTINE W REFLEX MICROSCOPIC (NOT AT [facility]) - Abnormal; Notable for the following:    Hgb urine dipstick TRACE (*)    All other components within normal limits  URINE MICROSCOPIC-ADD ON - Abnormal; Notable for the following:    Squamous Epithelial / LPF 0-5 (*)    All other components within normal limits  CBC WITH DIFFERENTIAL/PLATELET    Imaging Review Ct Head Wo  Contrast  10/21/2015  CLINICAL DATA:  Hearing hammering in the walls, and songs. Dull headache. Initial encounter. EXAM: CT HEAD WITHOUT CONTRAST TECHNIQUE: Contiguous axial images were obtained from the base of the skull through the vertex without intravenous contrast. COMPARISON:  CT of the head performed 06/27/2003, and MRI of the brain performed 06/28/2003 FINDINGS: There is no evidence of acute infarction, mass lesion, or intra- or extra-axial hemorrhage on CT. Prominence of the ventricles and sulci reflects mild to moderate cortical volume loss. Scattered periventricular and subcortical white matter change likely reflects small vessel ischemic microangiopathy. Cerebellar atrophy is noted. Chronic lacunar infarcts are noted at the right cerebellar hemisphere. The brainstem and fourth ventricle are within normal limits. The basal ganglia are unremarkable in appearance. The cerebral hemispheres demonstrate grossly normal gray-white differentiation. No mass effect or midline shift is seen. There is no evidence  of fracture; visualized osseous structures are unremarkable in appearance. The visualized portions of the orbits are within normal limits. The paranasal sinuses and mastoid air cells are well-aerated. No significant soft tissue abnormalities are seen. IMPRESSION: 1. No acute intracranial pathology seen on CT. 2. Mild moderate cortical volume loss and scattered small vessel ischemic microangiopathy. 3. Chronic lacunar infarcts at the right cerebellar hemisphere. Electronically Signed   By: Garald Balding M.D.   On: 10/21/2015 23:50   I have personally reviewed and evaluated these images and lab results as part of my medical decision-making.   EKG Interpretation   Date/Time:  Sunday October 22 2015 00:52:19 EDT Ventricular Rate:  70 PR Interval:  224 QRS Duration: 89 QT Interval:  398 QTC Calculation: 429 R Axis:   22 Text Interpretation:  Sinus rhythm Prolonged PR interval Low voltage,   precordial leads Abnormal R-wave progression, early transition Confirmed  by Salam Chesterfield  MD, [place] ([id number]) on 10/22/2015 2:22:31 AM      MDM   Final diagnoses:  Auditory hallucination    Patient presents with 1 week history of auditory hallucinations.  Nontoxic.  Afebrile.  Neurologically intact.  GOod insight.  Basic labwork and CT head reassuring.  No indication for further w/u at this time. NO emergent organic cause found at this time. Patient needs to f/u closely with PCP.  Needs outpatient w/u with possible MRI and w/u for dementia.  After history, exam, and medical workup I feel the patient has been appropriately medically screened and is safe for discharge home. Pertinent diagnoses were discussed with the patient. Patient was given return precautions.  I personally performed the services described in this documentation, which was scribed in my presence. The recorded information has been reviewed and is accurate.   Merryl Hacker, MD 10/24/15 [phone]

## 2015-10-22 NOTE — Discharge Instructions (Signed)
You were seen today for auditory hallucinations. Her CT scan, CBC, BMP, and urinalysis were reassuring. You need to follow-up closely with your primary physician for further workup. If you develop any new or worsening symptoms including difficulty speaking, weakness, headache she should be reevaluated.

## 2015-10-23 ENCOUNTER — Other Ambulatory Visit: Payer: Self-pay | Admitting: Gastroenterology

## 2015-10-23 ENCOUNTER — Ambulatory Visit (HOSPITAL_COMMUNITY): Admission: RE | Admit: 2015-10-23 | Payer: Medicare Other | Source: Ambulatory Visit | Admitting: Gastroenterology

## 2015-10-23 SURGERY — ESOPHAGOGASTRODUODENOSCOPY (EGD) WITH PROPOFOL
Anesthesia: Monitor Anesthesia Care

## 2015-10-27 DIAGNOSIS — R440 Auditory hallucinations: Secondary | ICD-10-CM | POA: Diagnosis not present

## 2015-11-13 ENCOUNTER — Encounter (HOSPITAL_COMMUNITY): Payer: Self-pay | Admitting: *Deleted

## 2015-11-13 ENCOUNTER — Other Ambulatory Visit: Payer: Self-pay | Admitting: Gastroenterology

## 2015-11-13 NOTE — Progress Notes (Signed)
Pt denies SOB and chest pain. Pt stated that her last dose of Aggrenox was yesterday. When advising pt on medications to take DOS, pt stated, " I do not want to take anything." Pt stated that she does not take Aspirin but was advised to stop taking vitamins, fish oil, herbal medications and NSAID's. Spoke with Willeen Cass, NP, Anesthesia, regarding pt history; no new orders given. Pt verbalized understanding of all pre-op instructions.

## 2015-11-13 NOTE — Progress Notes (Signed)
Anesthesia Chart Review:  Pt is an 82 year old female scheduled for EGD on 11/14/2015 with Dr. Laurence Spates.   Pt is a same day work up.   PMH includes:  CAD, TIA x5, HTN, hyperlipidemia, spastic congenital paraplegia. Never smoker. BMI 31  Medications include: lipitor, lisinopril, prilosec  Chest x-ray 09/03/15 reviewed. No acute cardiopulmonary process.   EKG 10/22/15: sinus rhythm. Prolonged PR interval. Low voltage precordial leads. Abnormal R-wave progression, early transition.   Cardiac cath 03/25/13:  1. Moderate non-obstructive CAD (LM 30%, first diagonal has ostial 50%, second diagonal has diffuse 40-50%, mid Cx 30%, mid RCA 30%) 2. Normal LV systolic function 3. Non-cardiac chest pain  Pt has not had cardiology follow up since 04/09/13 but denied any CV symptoms to PAT RN.   If no changes, I anticipate pt can proceed with surgery as scheduled.   Willeen Cass, FNP-BC [facility]/Anesthesiology Phone: [phone] 11/13/2015 3:53 PM

## 2015-11-14 ENCOUNTER — Ambulatory Visit (HOSPITAL_COMMUNITY)
Admission: RE | Admit: 2015-11-14 | Discharge: 2015-11-14 | Disposition: A | Discharge status: Home / Self Care | Payer: Medicare Other | Source: Ambulatory Visit | Attending: Gastroenterology | Admitting: Gastroenterology

## 2015-11-14 ENCOUNTER — Ambulatory Visit (HOSPITAL_COMMUNITY): Payer: Medicare Other | Admitting: Emergency Medicine

## 2015-11-14 ENCOUNTER — Encounter (HOSPITAL_COMMUNITY): Payer: Self-pay

## 2015-11-14 ENCOUNTER — Encounter (HOSPITAL_COMMUNITY)
Admission: RE | Disposition: A | Discharge status: Home / Self Care | Payer: Self-pay | Source: Ambulatory Visit | Attending: Gastroenterology

## 2015-11-14 DIAGNOSIS — R1310 Dysphagia, unspecified: Secondary | ICD-10-CM | POA: Diagnosis not present

## 2015-11-14 DIAGNOSIS — I2510 Atherosclerotic heart disease of native coronary artery without angina pectoris: Secondary | ICD-10-CM | POA: Insufficient documentation

## 2015-11-14 DIAGNOSIS — Z880 Allergy status to penicillin: Secondary | ICD-10-CM | POA: Insufficient documentation

## 2015-11-14 DIAGNOSIS — R0789 Other chest pain: Secondary | ICD-10-CM | POA: Diagnosis not present

## 2015-11-14 DIAGNOSIS — Z8673 Personal history of transient ischemic attack (TIA), and cerebral infarction without residual deficits: Secondary | ICD-10-CM | POA: Insufficient documentation

## 2015-11-14 DIAGNOSIS — Z7982 Long term (current) use of aspirin: Secondary | ICD-10-CM | POA: Insufficient documentation

## 2015-11-14 DIAGNOSIS — I10 Essential (primary) hypertension: Secondary | ICD-10-CM | POA: Diagnosis not present

## 2015-11-14 HISTORY — DX: Unspecified hemorrhoids: K64.9

## 2015-11-14 HISTORY — PX: ESOPHAGOGASTRODUODENOSCOPY: SHX5428

## 2015-11-14 HISTORY — DX: Cerebral infarction, unspecified: I63.9

## 2015-11-14 HISTORY — DX: Prediabetes: R73.03

## 2015-11-14 SURGERY — EGD (ESOPHAGOGASTRODUODENOSCOPY)
Anesthesia: Monitor Anesthesia Care

## 2015-11-14 MED ORDER — LACTATED RINGERS IV SOLN
INTRAVENOUS | Status: DC
  Administered 2015-11-14: 13:00:00 via INTRAVENOUS

## 2015-11-14 MED ORDER — LACTATED RINGERS IV SOLN
INTRAVENOUS | Status: DC | PRN
  Administered 2015-11-14: 13:00:00 via INTRAVENOUS

## 2015-11-14 MED ORDER — SODIUM CHLORIDE 0.9 % IV SOLN: INTRAVENOUS | Status: DC

## 2015-11-14 MED ORDER — ONDANSETRON HCL 4 MG/2ML IJ SOLN
INTRAMUSCULAR | Status: DC | PRN
  Administered 2015-11-14: 4 mg via INTRAVENOUS

## 2015-11-14 MED ORDER — LIDOCAINE HCL (CARDIAC) 20 MG/ML IV SOLN
INTRAVENOUS | Status: DC | PRN
  Administered 2015-11-14: 40 mg via INTRATRACHEAL

## 2015-11-14 MED ORDER — PROPOFOL 500 MG/50ML IV EMUL
INTRAVENOUS | Status: DC | PRN
  Administered 2015-11-14: 25 ug/kg/min via INTRAVENOUS

## 2015-11-14 MED ORDER — BUTAMBEN-TETRACAINE-BENZOCAINE 2-2-14 % EX AERO
INHALATION_SPRAY | CUTANEOUS | Status: DC | PRN
  Administered 2015-11-14: 1 via TOPICAL

## 2015-11-14 MED ORDER — PROPOFOL 10 MG/ML IV BOLUS
INTRAVENOUS | Status: DC | PRN
  Administered 2015-11-14 (×2): 30 mg via INTRAVENOUS

## 2015-11-14 NOTE — Discharge Instructions (Signed)

## 2015-11-14 NOTE — Anesthesia Preprocedure Evaluation (Signed)
Anesthesia Evaluation  Patient identified by MRN, date of birth, ID band Patient awake    Reviewed: Allergy & Precautions, NPO status , Patient's Chart, lab work & pertinent test results  Airway Mallampati: II  TM Distance: >3 FB Neck ROM: Full    Dental  (+) Partial Upper, Dental Advisory Given,    Pulmonary neg pulmonary ROS,    breath sounds clear to auscultation       Cardiovascular hypertension, + angina + CAD   Rhythm:Regular Rate:Normal     Neuro/Psych  Headaches, CVA, No Residual Symptoms negative psych ROS   GI/Hepatic negative GI ROS, Neg liver ROS,   Endo/Other  negative endocrine ROS  Renal/GU negative Renal ROS  negative genitourinary   Musculoskeletal negative musculoskeletal ROS (+)   Abdominal Normal abdominal exam  (+) - obese,   Peds negative pediatric ROS (+)  Hematology negative hematology ROS (+)   Anesthesia Other Findings   Reproductive/Obstetrics negative OB ROS                             Anesthesia Physical Anesthesia Plan  ASA: III  Anesthesia Plan: MAC   Post-op Pain Management:    Induction: Intravenous  Airway Management Planned: Simple Face Mask  Additional Equipment:   Intra-op Plan:   Post-operative Plan: Extubation in OR  Informed Consent: I have reviewed the patients History and Physical, chart, labs and discussed the procedure including the risks, benefits and alternatives for the proposed anesthesia with the patient or authorized representative who has indicated his/her understanding and acceptance.   Dental advisory given  Plan Discussed with: CRNA  Anesthesia Plan Comments:         Anesthesia Quick Evaluation

## 2015-11-14 NOTE — Transfer of Care (Signed)
Immediate Anesthesia Transfer of Care Note  Patient: Vicki Stein  Procedure(s) Performed: Procedure(s): ESOPHAGOGASTRODUODENOSCOPY (EGD) (N/A)  Patient Location: PACU  Anesthesia Type:MAC  Level of Consciousness: awake, alert , oriented, patient cooperative and responds to stimulation  Airway & Oxygen Therapy: Patient Spontanous Breathing and Patient connected to nasal cannula oxygen  Post-op Assessment: Report given to RN, Post -op Vital signs reviewed and stable, Patient moving all extremities and Patient moving all extremities X 4  Post vital signs: Reviewed and stable  Last Vitals:  Filed Vitals:   11/14/15 1215  BP: 167/68  Temp: 36.9 C  Resp: 16    Last Pain:  Filed Vitals:   11/14/15 1217  PainSc: 2          Complications: No apparent anesthesia complications

## 2015-11-14 NOTE — Anesthesia Postprocedure Evaluation (Signed)
Anesthesia Post Note  Patient: Vicki Stein  Procedure(s) Performed: Procedure(s) (LRB): ESOPHAGOGASTRODUODENOSCOPY (EGD) (N/A)  Patient location during evaluation: PACU Anesthesia Type: MAC Level of consciousness: awake and alert Pain management: pain level controlled Vital Signs Assessment: post-procedure vital signs reviewed and stable Respiratory status: spontaneous breathing, nonlabored ventilation, respiratory function stable and patient connected to nasal cannula oxygen Cardiovascular status: stable and blood pressure returned to baseline Anesthetic complications: no    Last Vitals:  Filed Vitals:   11/14/15 1215 11/14/15 1345  BP: 167/68 140/58  Pulse:  63  Temp: 36.9 C 36.6 C  Resp: 16 14    Last Pain:  Filed Vitals:   11/14/15 1348  PainSc: 2                  Effie Berkshire

## 2015-11-14 NOTE — Op Note (Signed)
[facility] Patient Name: Vicki Stein Procedure Date : 11/14/2015 MRN: [id number] Attending MD: Nancy Fetter , MD Date of Birth: 07/03/1928 CSN: [id number] Age: 82 Admit Type: Outpatient Procedure:                Upper GI endoscopy Indications:              Odynophagia Providers:                [name] L. Oletta Lamas, MD, Carolynn Comment, RN, Ralene Bathe, Technician, Jacquiline Doe, CRNA Referring MD:              Medicines:                Propofol total dose 70 mg IV, Cetacaine spray Complications:            No immediate complications. Estimated Blood Loss:     Estimated blood loss: none. Estimated blood loss:                            none. Procedure:                Pre-Anesthesia Assessment:                           - Prior to the procedure, a History and Physical                            was performed, and patient medications and                            allergies were reviewed. The patient's tolerance of                            previous anesthesia was also reviewed. The risks                            and benefits of the procedure and the sedation                            options and risks were discussed with the patient.                            All questions were answered, and informed consent                            was obtained. Prior Anticoagulants: The patient has                            taken no previous anticoagulant or antiplatelet                            agents. ASA Grade Assessment: III - A patient with  severe systemic disease. After reviewing the risks                            and benefits, the patient was deemed in                            satisfactory condition to undergo the procedure.                           After obtaining informed consent, the endoscope was                            passed under direct vision. Throughout the                            procedure, the  patient's blood pressure, pulse, and                            oxygen saturations were monitored continuously. The                            [id number]) scope was introduced through the                            mouth, and advanced to the second part of duodenum.                            The upper GI endoscopy was accomplished without                            difficulty. The patient tolerated the procedure                            well. Scope In: Scope Out: Findings:      There is no endoscopic evidence of Barrett's esophagus, esophagitis,       inflammation, stenosis, stricture or ulcerations in the entire esophagus.      The stomach was normal.      The examined duodenum was normal. Impression:               - Normal stomach.                           - Normal examined duodenum.                           - No specimens collected.                           - Odynophagia without cause on EGD Moderate Sedation:      MAC by anesthesia Recommendation:           - Patient has a contact number available for                            emergencies. The signs and symptoms of potential  delayed complications were discussed with the                            patient. Return to normal activities tomorrow.                            Written discharge instructions were provided to the                            patient.                           - Resume previous diet.                           - Continue present medications.                           - Return to endoscopist in 3 months. Procedure Code(s):        --- Professional ---                           [phone], Esophagogastroduodenoscopy, flexible,                            transoral; diagnostic, including collection of                            specimen(s) by brushing or washing, when performed                            (separate procedure) Diagnosis Code(s):        --- Professional ---                            R13.10, Dysphagia, unspecified CPT copyright 2016 American Medical Association. All rights reserved. The codes documented in this report are preliminary and upon coder review may  be revised to meet current compliance requirements. Laurence Spates, MD Nancy Fetter, MD 11/14/2015 1:53:11 PM This report has been signed electronically. Number of Addenda: 0

## 2015-11-14 NOTE — H&P (Signed)
Subjective:   Patient is a 82 y.o. female presents with Continued pressure and discomfort with swallowing. Things slow down and she regurgitates occasionally. A barium swallow showed no gross stricture no obvious obstruction with poor contractility. Barium tablet passed easily in the stomach. She had what was felt to be presbyesophagus. Procedure including risks and benefits discussed in office.  Patient Active Problem List   Diagnosis Date Noted  . Chest pain 03/25/2013  . Dyslipidemia 03/25/2013  . HTN (hypertension) 03/25/2013  . Gout 03/25/2013  . Unstable angina ([place]) 03/25/2013  . 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 Paraplegia, spastic congenital [facility])    Past Medical History  Diagnosis Date  . Hypertension   . Hyperlipemia   . High cholesterol   . Back pain   . Migraines   . Cerebrovascular disease   . Gout   . Reflux   . 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   Marland Kitchen Movement disorder   . Coronary artery disease     a. LHC (9/14):  pLM 30, oD1 50, D2 40-50, pRI 30, mCFX 30, mRCA 30, EF 65%  . Stroke [facility])     " 5 mini strokes"  . Borderline diabetes   . Hemorrhoids     Past Surgical History  Procedure Laterality Date  . Cholecystectomy    . Rotator cuff repair    . Left heart catheterization with coronary angiogram N/A 03/25/2013    Procedure: LEFT HEART CATHETERIZATION WITH CORONARY ANGIOGRAM;  Surgeon: Burnell Blanks, MD;  Location: [facility] CATH LAB;  Service: Cardiovascular;  Laterality: N/A;  . Cataract extraction w/ intraocular lens  implant, bilateral    . Tonsillectomy    . Appendectomy    . Dilation and curettage of uterus    . Colonoscopy    . Tubal ligation    . Hemorrhoid surgery      Prescriptions prior to admission  Medication Sig Dispense Refill Last Dose  . acetaminophen (TYLENOL) 650 MG CR tablet Take 650 mg by mouth every 8 (eight) hours as needed for pain.   Past Week at Unknown time  . ALPRAZolam (XANAX) 0.5  MG tablet Take 0.5 mg by mouth at bedtime as needed for sleep.    Past Week at Unknown time  . atorvastatin (LIPITOR) 40 MG tablet Take 40 mg by mouth daily.   11/13/2015 at 0900  . diclofenac sodium (VOLTAREN) 1 % GEL Apply 2 g topically 2 (two) times daily as needed (for pain). Apply to knees, ankles, and hands, for arthritis   Past Week at Unknown time  . dipyridamole-aspirin (AGGRENOX) 200-25 MG per 12 hr capsule Take 1 capsule by mouth 2 (two) times daily.   Past Week at Unknown time  . HYDROcodone-acetaminophen (NORCO) 10-325 MG per tablet Take 0.5-1 tablets by mouth every 6 (six) hours as needed for pain.   Past Week at Unknown time  . lisinopril (PRINIVIL,ZESTRIL) 40 MG tablet Take 40 mg by mouth every morning.    11/13/2015 at 0900  . omeprazole (PRILOSEC) 40 MG capsule Take 40 mg by mouth daily.   Past Week at Unknown time  . sucralfate (CARAFATE) 1 GM/10ML suspension Take 10 mLs (1 g total) by mouth 4 (four) times daily -  with meals and at bedtime. 420 mL 0 Past Week at Unknown time   Allergies  Allergen Reactions  . Aleve [Naproxen Sodium] Itching and Swelling  . Bactrim [Sulfamethoxazole-Trimethoprim] Itching    Had a bad reaction,  can't remember, worked me over  . Motrin [Ibuprofen] Itching and Swelling  . Penicillins Hives and Itching    Has patient had a PCN reaction causing immediate rash, facial/tongue/throat swelling, SOB or lightheadedness with hypotension: No Has patient had a PCN reaction causing severe rash involving mucus membranes or skin necrosis: No Has patient had a PCN reaction that required hospitalization No Has patient had a PCN reaction occurring within the last 10 years: No If all of the above answers are "NO", then may proceed with Cephalosporin use.  Marland Kitchen Ultram [Tramadol]     Unknown - on list of allergies, but dog takes    Social History  Substance Use Topics  . Smoking status: Never Smoker   . Smokeless tobacco: Never Used  . Alcohol Use: No    Family  History  Problem Relation Age of Onset  . Heart Problems Mother   . Heart Problems Father   . Heart attack Son      Objective:   Patient Vitals for the past 8 hrs:  BP Temp Temp src Resp SpO2 Height Weight  11/14/15 1215 (!) 167/68 mmHg 98.5 F (36.9 C) Oral 16 100 % 5\' 2"  (1.575 m) 65.772 kg (145 lb)         See MD Preop evaluation      Assessment:    Odynophagia of unclear cause  Plan:   We will proceed with EGD at this time to evaluate this endoscopically.

## 2015-11-15 ENCOUNTER — Encounter (HOSPITAL_COMMUNITY): Payer: Self-pay | Admitting: Gastroenterology

## 2015-12-01 DIAGNOSIS — R440 Auditory hallucinations: Secondary | ICD-10-CM | POA: Diagnosis not present

## 2015-12-01 DIAGNOSIS — H938X9 Other specified disorders of ear, unspecified ear: Secondary | ICD-10-CM | POA: Diagnosis not present

## 2015-12-01 DIAGNOSIS — R1310 Dysphagia, unspecified: Secondary | ICD-10-CM | POA: Diagnosis not present

## 2016-01-11 DIAGNOSIS — G4700 Insomnia, unspecified: Secondary | ICD-10-CM | POA: Diagnosis not present

## 2016-01-11 DIAGNOSIS — J309 Allergic rhinitis, unspecified: Secondary | ICD-10-CM | POA: Diagnosis not present

## 2016-01-11 DIAGNOSIS — I10 Essential (primary) hypertension: Secondary | ICD-10-CM | POA: Diagnosis not present

## 2016-01-11 DIAGNOSIS — M12811 Other specific arthropathies, not elsewhere classified, right shoulder: Secondary | ICD-10-CM | POA: Diagnosis not present

## 2016-01-11 DIAGNOSIS — G114 Hereditary spastic paraplegia: Secondary | ICD-10-CM | POA: Diagnosis not present

## 2016-02-22 DIAGNOSIS — K219 Gastro-esophageal reflux disease without esophagitis: Secondary | ICD-10-CM | POA: Diagnosis not present

## 2016-02-22 DIAGNOSIS — R1310 Dysphagia, unspecified: Secondary | ICD-10-CM | POA: Diagnosis not present

## 2016-03-13 DIAGNOSIS — H9319 Tinnitus, unspecified ear: Secondary | ICD-10-CM | POA: Diagnosis not present

## 2016-03-13 DIAGNOSIS — Z23 Encounter for immunization: Secondary | ICD-10-CM | POA: Diagnosis not present

## 2016-03-13 DIAGNOSIS — M12811 Other specific arthropathies, not elsewhere classified, right shoulder: Secondary | ICD-10-CM | POA: Diagnosis not present

## 2016-06-14 DIAGNOSIS — M25511 Pain in right shoulder: Secondary | ICD-10-CM | POA: Diagnosis not present

## 2016-06-14 DIAGNOSIS — R440 Auditory hallucinations: Secondary | ICD-10-CM | POA: Diagnosis not present

## 2016-06-14 DIAGNOSIS — H9313 Tinnitus, bilateral: Secondary | ICD-10-CM | POA: Diagnosis not present

## 2016-07-09 DIAGNOSIS — K219 Gastro-esophageal reflux disease without esophagitis: Secondary | ICD-10-CM | POA: Diagnosis not present

## 2016-07-09 DIAGNOSIS — E7800 Pure hypercholesterolemia, unspecified: Secondary | ICD-10-CM | POA: Diagnosis not present

## 2016-07-09 DIAGNOSIS — I10 Essential (primary) hypertension: Secondary | ICD-10-CM | POA: Diagnosis not present

## 2016-07-09 DIAGNOSIS — R7303 Prediabetes: Secondary | ICD-10-CM | POA: Diagnosis not present

## 2016-07-09 DIAGNOSIS — M12811 Other specific arthropathies, not elsewhere classified, right shoulder: Secondary | ICD-10-CM | POA: Diagnosis not present

## 2016-07-09 DIAGNOSIS — I679 Cerebrovascular disease, unspecified: Secondary | ICD-10-CM | POA: Diagnosis not present

## 2016-07-29 DIAGNOSIS — M19011 Primary osteoarthritis, right shoulder: Secondary | ICD-10-CM | POA: Diagnosis not present

## 2016-07-29 DIAGNOSIS — M19012 Primary osteoarthritis, left shoulder: Secondary | ICD-10-CM | POA: Diagnosis not present

## 2016-07-31 DIAGNOSIS — M19012 Primary osteoarthritis, left shoulder: Secondary | ICD-10-CM | POA: Diagnosis not present

## 2016-07-31 DIAGNOSIS — M19011 Primary osteoarthritis, right shoulder: Secondary | ICD-10-CM | POA: Diagnosis not present

## 2016-10-30 DIAGNOSIS — K644 Residual hemorrhoidal skin tags: Secondary | ICD-10-CM | POA: Diagnosis not present

## 2016-12-18 DIAGNOSIS — Z0000 Encounter for general adult medical examination without abnormal findings: Secondary | ICD-10-CM | POA: Diagnosis not present

## 2016-12-18 DIAGNOSIS — I10 Essential (primary) hypertension: Secondary | ICD-10-CM | POA: Diagnosis not present

## 2016-12-18 DIAGNOSIS — R7303 Prediabetes: Secondary | ICD-10-CM | POA: Diagnosis not present

## 2016-12-18 DIAGNOSIS — M12811 Other specific arthropathies, not elsewhere classified, right shoulder: Secondary | ICD-10-CM | POA: Diagnosis not present

## 2017-03-06 DIAGNOSIS — N939 Abnormal uterine and vaginal bleeding, unspecified: Secondary | ICD-10-CM | POA: Diagnosis not present

## 2017-03-06 DIAGNOSIS — N952 Postmenopausal atrophic vaginitis: Secondary | ICD-10-CM | POA: Diagnosis not present

## 2017-03-06 DIAGNOSIS — N760 Acute vaginitis: Secondary | ICD-10-CM | POA: Diagnosis not present

## 2017-03-13 DIAGNOSIS — Z23 Encounter for immunization: Secondary | ICD-10-CM | POA: Diagnosis not present

## 2017-03-13 DIAGNOSIS — G894 Chronic pain syndrome: Secondary | ICD-10-CM | POA: Diagnosis not present

## 2017-03-13 DIAGNOSIS — M12811 Other specific arthropathies, not elsewhere classified, right shoulder: Secondary | ICD-10-CM | POA: Diagnosis not present

## 2017-03-13 DIAGNOSIS — Z5181 Encounter for therapeutic drug level monitoring: Secondary | ICD-10-CM | POA: Diagnosis not present

## 2017-04-01 DIAGNOSIS — N841 Polyp of cervix uteri: Secondary | ICD-10-CM | POA: Diagnosis not present

## 2017-04-01 DIAGNOSIS — N939 Abnormal uterine and vaginal bleeding, unspecified: Secondary | ICD-10-CM | POA: Diagnosis not present

## 2017-04-01 DIAGNOSIS — N859 Noninflammatory disorder of uterus, unspecified: Secondary | ICD-10-CM | POA: Diagnosis not present

## 2017-04-02 DIAGNOSIS — N841 Polyp of cervix uteri: Secondary | ICD-10-CM | POA: Diagnosis not present

## 2017-04-11 DIAGNOSIS — H2513 Age-related nuclear cataract, bilateral: Secondary | ICD-10-CM | POA: Diagnosis not present

## 2017-04-25 ENCOUNTER — Telehealth: Payer: Self-pay | Admitting: *Deleted

## 2017-04-25 NOTE — Telephone Encounter (Signed)
NOTES SENT TO SCHEDULING.  °

## 2017-04-29 ENCOUNTER — Inpatient Hospital Stay (HOSPITAL_COMMUNITY)
Admission: RE | Admit: 2017-04-29 | Discharge: 2017-04-29 | Disposition: A | Discharge status: Home / Self Care | Payer: Medicare Other | Source: Ambulatory Visit

## 2017-04-29 ENCOUNTER — Encounter (HOSPITAL_COMMUNITY): Payer: Self-pay

## 2017-04-29 NOTE — Patient Instructions (Signed)
Your procedure is scheduled on:  Thursday, Nov. 1, 2018  Enter through the [employer] [facility] at:  10:45 AM  Pick up the phone at the desk and dial [phone].  Call this number if you have problems the morning of surgery: [phone].  Remember: Do NOT eat food:  After Midnight Wednesday  Do NOT drink clear liquids after:  6:00 AM Thursday  Take these medicines the morning of surgery with a SIP OF WATER:  Atorvastatin, Omperazole  Stop ALL herbal medications at this time  Do NOT smoke the day of surgery.  Do NOT wear jewelry (body piercing), metal hair clips/bobby pins, make-up, artifical eyelashes or nail polish. Do NOT wear lotions, powders, or perfumes.  You may wear deodorant. Do NOT shave for 48 hours prior to surgery. Do NOT bring valuables to the hospital. Contacts, dentures, or bridgework may not be worn into surgery.  Have a responsible adult drive you home and stay with you for 24 hours after your procedure  Bring a copy of your healthcare power of attorney and living will documents.

## 2017-04-29 NOTE — Pre-Procedure Instructions (Signed)
Spoke with Myrene about cardiac clearance for Vicki Stein.  Surgery will be reschedule until clearance is completed.

## 2017-05-08 ENCOUNTER — Ambulatory Visit (HOSPITAL_COMMUNITY)
Admission: AD | Admit: 2017-05-08 | Payer: Medicare Other | Source: Ambulatory Visit | Admitting: Obstetrics & Gynecology

## 2017-05-08 ENCOUNTER — Encounter (HOSPITAL_COMMUNITY): Admission: AD | Payer: Self-pay | Source: Ambulatory Visit

## 2017-05-08 DIAGNOSIS — R443 Hallucinations, unspecified: Secondary | ICD-10-CM | POA: Diagnosis not present

## 2017-05-08 DIAGNOSIS — R440 Auditory hallucinations: Secondary | ICD-10-CM | POA: Diagnosis not present

## 2017-05-08 DIAGNOSIS — N390 Urinary tract infection, site not specified: Secondary | ICD-10-CM | POA: Diagnosis not present

## 2017-05-08 DIAGNOSIS — R319 Hematuria, unspecified: Secondary | ICD-10-CM | POA: Diagnosis not present

## 2017-05-08 DIAGNOSIS — I10 Essential (primary) hypertension: Secondary | ICD-10-CM | POA: Diagnosis not present

## 2017-05-08 SURGERY — DILATATION & CURETTAGE/HYSTEROSCOPY WITH MYOSURE
Anesthesia: Choice

## 2017-05-15 ENCOUNTER — Other Ambulatory Visit: Payer: Self-pay | Admitting: Family Medicine

## 2017-05-15 DIAGNOSIS — R443 Hallucinations, unspecified: Secondary | ICD-10-CM

## 2017-05-15 DIAGNOSIS — Z8673 Personal history of transient ischemic attack (TIA), and cerebral infarction without residual deficits: Secondary | ICD-10-CM

## 2017-05-20 ENCOUNTER — Ambulatory Visit (INDEPENDENT_AMBULATORY_CARE_PROVIDER_SITE_OTHER): Payer: Medicare Other | Admitting: Cardiology

## 2017-05-20 ENCOUNTER — Encounter: Payer: Self-pay | Admitting: Cardiology

## 2017-05-20 VITALS — BP 116/54 | HR 70 | Ht 62.0 in | Wt 143.0 lb

## 2017-05-20 DIAGNOSIS — Z01818 Encounter for other preprocedural examination: Secondary | ICD-10-CM

## 2017-05-20 DIAGNOSIS — Z01810 Encounter for preprocedural cardiovascular examination: Secondary | ICD-10-CM | POA: Diagnosis not present

## 2017-05-20 NOTE — Patient Instructions (Addendum)
Medication Instructions:    Your physician recommends that you continue on your current medications as directed. Please refer to the Current Medication list given to you today.   If you need a refill on your cardiac medications before your next appointment, please call your pharmacy.  Labwork: NONE ORDERED  TODAY    Testing/Procedures: NONE ORDERED  TODAY   Follow-Up: Your physician wants you to follow-up in: ONE YEAR WITH  SKAINS   You will receive a reminder letter in the mail two months in advance. If you don't receive a letter, please call our office to schedule the follow-up appointment.     Any Other Special Instructions Will Be Listed Below (If Applicable).                                                                                                                                                   

## 2017-05-20 NOTE — Progress Notes (Signed)
05/20/2017 Vicki Stein   1928/06/16  [id number]  Primary Physician Briscoe Deutscher, MD Primary Cardiologist: New   Reason for Visit/CC: Pre-operative Evaluation for Surgical Clearance   HPI:  Vicki Stein is a 83 y.o. female who is being seen today for preoperative evaluation. She has a h/o mild nonobstructive CAD, HTN, HLD and multiple strokes. She had a cath by Dr. Angelena Form in 2014 that showed 40-50% diagonal disease and 30% LM, 30% LCx and mid 30% RCA.  EF was normal. Medical therapy recommended. She was lost to f/u. Not seen since 2014.  Given her coronary disease, she has been referred back by her PCP at [facility] for cardiac clearance. She has had recent pelvic pain and had an ultrasound which showed a uterine mass. She will need hysteroscopy and D&C.  She denies any cardiac symptoms. No chest pain, dyspnea, palpitations, syncope/ near syncope, orthopnea, PND or LEE. She is however limited by what she can do physically due to her previous strokes as well as h/o hereditary spastic paraparesis. She is able to ambulate with use of a walker but no heavy physical activity other than basic ADLs. She has no limitations with day to day activities.  Her EKG today is differrent compared to prior EKGs. EKG today demonstrates SR with PACs w slight widening of her QRS, early development of LBBB.   Current Meds  Medication Sig  . acetaminophen (TYLENOL) 500 MG tablet Take 500 mg by mouth every 6 (six) hours as needed for mild pain or moderate pain.  Marland Kitchen atorvastatin (LIPITOR) 40 MG tablet Take 40 mg by mouth daily.  . diclofenac sodium (VOLTAREN) 1 % GEL Apply 2 g topically 2 (two) times daily as needed (for pain). Apply to knees, ankles, and hands, for arthritis  . dipyridamole-aspirin (AGGRENOX) 200-25 MG per 12 hr capsule Take 1 capsule by mouth 2 (two) times daily.  . fluticasone (FLONASE) 50 MCG/ACT nasal spray Place 1 spray into both nostrils daily as needed.  Marland Kitchen HYDROcodone-acetaminophen  (NORCO) 10-325 MG per tablet Take 0.5-1 tablets by mouth every 6 (six) hours as needed for pain.  Marland Kitchen lisinopril (PRINIVIL,ZESTRIL) 40 MG tablet Take 40 mg by mouth every morning.   Marland Kitchen omeprazole (PRILOSEC) 40 MG capsule Take 40 mg by mouth daily.   Allergies  Allergen Reactions  . Aleve [Naproxen Sodium] Itching and Swelling  . Bactrim [Sulfamethoxazole-Trimethoprim] Itching    Had a bad reaction, can't remember, worked me over  . Motrin [Ibuprofen] Itching and Swelling  . Penicillins Hives and Itching    Has patient had a PCN reaction causing immediate rash, facial/tongue/throat swelling, SOB or lightheadedness with hypotension: No Has patient had a PCN reaction causing severe rash involving mucus membranes or skin necrosis: No Has patient had a PCN reaction that required hospitalization No Has patient had a PCN reaction occurring within the last 10 years: No If all of the above answers are "NO", then may proceed with Cephalosporin use.  Marland Kitchen Ultram [Tramadol]     Unknown - on list of allergies, but dog takes   Past Medical History:  Diagnosis Date  . Back pain   . Borderline diabetes   . Cerebrovascular disease   . Coronary artery disease    a. LHC (9/14):  pLM 30, oD1 50, D2 40-50, pRI 30, mCFX 30, mRCA 30, EF 65%  . Gout   . Hemorrhoids   . High cholesterol   . Hyperlipemia   . Hypertension   . Migraines   .  Movement disorder   . 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   Marland Kitchen Reflux   . Stroke [facility])    " 5 mini strokes"   Family History  Problem Relation Age of Onset  . Heart Problems Mother   . Heart Problems Father   . Heart attack Son    Past Surgical History:  Procedure Laterality Date  . APPENDECTOMY    . CATARACT EXTRACTION W/ INTRAOCULAR LENS  IMPLANT, BILATERAL    . CHOLECYSTECTOMY    . COLONOSCOPY    . DILATION AND CURETTAGE OF UTERUS    . HEMORRHOID SURGERY    . ROTATOR CUFF REPAIR    . TONSILLECTOMY    . TUBAL LIGATION     Social History    Socioeconomic History  . Marital status: Widowed    Spouse name: Not on file  . Number of children: 3  . Years of education: 10th  . Highest education level: Not on file  Social Needs  . Financial resource strain: Not on file  . Food insecurity - worry: Not on file  . Food insecurity - inability: Not on file  . Transportation needs - medical: Not on file  . Transportation needs - non-medical: Not on file  Occupational History  . Occupation: retired  Tobacco Use  . Smoking status: Never Smoker  . Smokeless tobacco: Never Used  Substance and Sexual Activity  . Alcohol use: No  . Drug use: No  . Sexual activity: No  Other Topics Concern  . Not on file  Social History Narrative   Patient lives alone, is a widow. Patient is retired. Patient has 10th grade education. Right handed.     Review of Systems: General: negative for chills, fever, night sweats or weight changes.  Cardiovascular: negative for chest pain, dyspnea on exertion, edema, orthopnea, palpitations, paroxysmal nocturnal dyspnea or shortness of breath Dermatological: negative for rash Respiratory: negative for cough or wheezing Urologic: negative for hematuria Abdominal: negative for nausea, vomiting, diarrhea, bright red blood per rectum, melena, or hematemesis Neurologic: negative for visual changes, syncope, or dizziness All other systems reviewed and are otherwise negative except as noted above.   Physical Exam:  Blood pressure (!) 116/54, pulse 70, height 5\' 2"  (1.575 m), weight 143 lb (64.9 kg), SpO2 96 %.  General appearance: alert, cooperative and no distress Neck: no carotid bruit and no JVD Lungs: clear to auscultation bilaterally Heart: regular rate and rhythm, S1, S2 normal, no murmur, click, rub or gallop Extremities: extremities normal, atraumatic, no cyanosis or edema Pulses: 2+ and symmetric Skin: Skin color, texture, turgor normal. No rashes or lesions Neurologic: Grossly normal  EKG SR  with PACs and slight QRS widening  -- personally reviewed   ASSESSMENT AND PLAN:   83 y/o female with mild-moderate CAD on cath in 2014 with 30% disease in the LM, LCx and RCA and 40-50% diagonal disease, treated medically. She has had no ischemic CP or exertional dyspnea w/ ADLs. Her EKG shows LBBB. Physical exam is benign. I've discussed patient case with Dr. Marlou Porch, DOD. He has reviewed her EKG and compared to previous. Given LHC < 5 years ago with only mild CAD and lack of ischemic symptoms, the patient can be cleared for hysteroscopy + D&C w/o need for further cardiac testing. Given her age, she would be of at least moderate risk for complications. I've notified pt of Dr. Kingsley Plan recommendation and notification that she can be cleared for surgery.   Follow-Up  in 1 year w/ Dr. Ezequiel Stein Vicki Stein, MHS [facility] 05/20/2017 2:48 PM

## 2017-05-22 ENCOUNTER — Ambulatory Visit
Admission: RE | Admit: 2017-05-22 | Discharge: 2017-05-22 | Disposition: A | Discharge status: Home / Self Care | Payer: Medicare Other | Source: Ambulatory Visit | Attending: Family Medicine | Admitting: Family Medicine

## 2017-05-22 DIAGNOSIS — Z8673 Personal history of transient ischemic attack (TIA), and cerebral infarction without residual deficits: Secondary | ICD-10-CM

## 2017-05-22 DIAGNOSIS — R443 Hallucinations, unspecified: Secondary | ICD-10-CM

## 2017-05-23 ENCOUNTER — Telehealth: Payer: Self-pay | Admitting: Cardiology

## 2017-05-23 NOTE — Telephone Encounter (Signed)
° °  [facility] Pre-operative Risk Assessment    Request for surgical clearance:  1. What type of surgery is being performed? DNC-Dr Nelda Marseille called today-she would like this back asap please   2. When is this surgery scheduled? Pending   3. Are there any medications that need to be held prior to surgery and how long?Can pt stop her Aggrenox? If so when and how long? Also when should she start back on it?   4. Practice name and name of physician performing surgery?Dr Otis Peak   5. What is your office phone and fax number? [phone] and fax is [phone]   6. Anesthesia type (None, local, MAC, general) ? General   Vicki Stein 05/23/2017, 11:42 AM  _________________________________________________________________   (provider comments below)

## 2017-05-23 NOTE — Telephone Encounter (Signed)
   Chart reviewed as part of pre-operative protocol coverage.  She was seen by Ellen Henri PA-C 05/20/2017 for preoperative clearance. She was evaluated and the case was discussed with Dr. Marlou Porch.  No further preoperative workup is needed prior to surgery.  Given her age, she would be at least moderate risk for complications.  Based on ACC/AHA guidelines, Vicki Stein would be at acceptable risk for the planned procedure without further cardiovascular testing.   I will route this recommendation to the requesting party via Epic fax function and remove from pre-op pool.  Please call with questions.  Rosaria Ferries, PA-C 05/23/2017, 3:46 PM

## 2017-05-23 NOTE — Telephone Encounter (Signed)
This clearance note and office note from 05/20/17 routed to Dr. Nelda Marseille via [place] fax

## 2017-05-26 ENCOUNTER — Telehealth: Payer: Self-pay | Admitting: Cardiology

## 2017-05-26 NOTE — Telephone Encounter (Signed)
Called Dr. Nelda Marseille back.  Per Ellen Henri, PA-C, the pt is not on Aggrenox for cardiac related issues, therefore, we cannot instruct on this medication.  They will need to contact pt's pcp or Neurologist.  Dr Nelda Marseille thanked me for my call.

## 2017-05-26 NOTE — Telephone Encounter (Signed)
Dr. Nelda Marseille received fax regarding surgical clearance , however she wants to be specific on if pt needs to stop/start Agranox, and if to stop when to continue-pls call cell [phone]

## 2017-06-20 DIAGNOSIS — N859 Noninflammatory disorder of uterus, unspecified: Secondary | ICD-10-CM | POA: Diagnosis not present

## 2017-06-20 DIAGNOSIS — Z01818 Encounter for other preprocedural examination: Secondary | ICD-10-CM | POA: Diagnosis not present

## 2017-06-20 DIAGNOSIS — I679 Cerebrovascular disease, unspecified: Secondary | ICD-10-CM | POA: Diagnosis not present

## 2017-06-20 DIAGNOSIS — Z9229 Personal history of other drug therapy: Secondary | ICD-10-CM | POA: Diagnosis not present

## 2017-06-20 DIAGNOSIS — I10 Essential (primary) hypertension: Secondary | ICD-10-CM | POA: Diagnosis not present

## 2017-06-20 DIAGNOSIS — G114 Hereditary spastic paraplegia: Secondary | ICD-10-CM | POA: Diagnosis not present

## 2017-06-23 ENCOUNTER — Encounter (HOSPITAL_COMMUNITY): Payer: Self-pay

## 2017-06-23 ENCOUNTER — Encounter (HOSPITAL_COMMUNITY)
Admission: RE | Admit: 2017-06-23 | Discharge: 2017-06-23 | Disposition: A | Discharge status: Home / Self Care | Payer: Medicare Other | Source: Ambulatory Visit | Attending: Obstetrics & Gynecology | Admitting: Obstetrics & Gynecology

## 2017-06-23 ENCOUNTER — Other Ambulatory Visit: Payer: Self-pay

## 2017-06-23 DIAGNOSIS — F329 Major depressive disorder, single episode, unspecified: Secondary | ICD-10-CM | POA: Diagnosis not present

## 2017-06-23 DIAGNOSIS — Z880 Allergy status to penicillin: Secondary | ICD-10-CM | POA: Diagnosis not present

## 2017-06-23 DIAGNOSIS — N950 Postmenopausal bleeding: Secondary | ICD-10-CM | POA: Diagnosis present

## 2017-06-23 DIAGNOSIS — M109 Gout, unspecified: Secondary | ICD-10-CM | POA: Diagnosis not present

## 2017-06-23 DIAGNOSIS — I10 Essential (primary) hypertension: Secondary | ICD-10-CM | POA: Diagnosis not present

## 2017-06-23 DIAGNOSIS — G114 Hereditary spastic paraplegia: Secondary | ICD-10-CM | POA: Diagnosis not present

## 2017-06-23 DIAGNOSIS — C541 Malignant neoplasm of endometrium: Secondary | ICD-10-CM | POA: Diagnosis not present

## 2017-06-23 DIAGNOSIS — I2510 Atherosclerotic heart disease of native coronary artery without angina pectoris: Secondary | ICD-10-CM | POA: Diagnosis not present

## 2017-06-23 DIAGNOSIS — E7800 Pure hypercholesterolemia, unspecified: Secondary | ICD-10-CM | POA: Diagnosis not present

## 2017-06-23 DIAGNOSIS — Z886 Allergy status to analgesic agent status: Secondary | ICD-10-CM | POA: Diagnosis not present

## 2017-06-23 DIAGNOSIS — K219 Gastro-esophageal reflux disease without esophagitis: Secondary | ICD-10-CM | POA: Diagnosis not present

## 2017-06-23 DIAGNOSIS — Z885 Allergy status to narcotic agent status: Secondary | ICD-10-CM | POA: Diagnosis not present

## 2017-06-23 DIAGNOSIS — Z79899 Other long term (current) drug therapy: Secondary | ICD-10-CM | POA: Diagnosis not present

## 2017-06-23 DIAGNOSIS — Z8673 Personal history of transient ischemic attack (TIA), and cerebral infarction without residual deficits: Secondary | ICD-10-CM | POA: Diagnosis not present

## 2017-06-23 DIAGNOSIS — Z882 Allergy status to sulfonamides status: Secondary | ICD-10-CM | POA: Diagnosis not present

## 2017-06-23 DIAGNOSIS — Z7982 Long term (current) use of aspirin: Secondary | ICD-10-CM | POA: Diagnosis not present

## 2017-06-23 DIAGNOSIS — M810 Age-related osteoporosis without current pathological fracture: Secondary | ICD-10-CM | POA: Diagnosis not present

## 2017-06-23 HISTORY — DX: Gastro-esophageal reflux disease without esophagitis: K21.9

## 2017-06-23 HISTORY — DX: Personal history of urinary calculi: Z87.442

## 2017-06-23 HISTORY — DX: Prediabetes: R73.03

## 2017-06-23 HISTORY — DX: Disease of blood and blood-forming organs, unspecified: D75.9

## 2017-06-23 HISTORY — DX: Unspecified osteoarthritis, unspecified site: M19.90

## 2017-06-23 HISTORY — DX: Peripheral vascular disease, unspecified: I73.9

## 2017-06-23 HISTORY — DX: Pneumonia, unspecified organism: J18.9

## 2017-06-23 LAB — CBC
HCT: 40.3 % (ref 36.0–46.0)
Hemoglobin: 13 g/dL (ref 12.0–15.0)
MCH: 30.4 pg (ref 26.0–34.0)
MCHC: 32.3 g/dL (ref 30.0–36.0)
MCV: 94.4 fL (ref 78.0–100.0)
PLATELETS: 156 10*3/uL (ref 150–400)
RBC: 4.27 MIL/uL (ref 3.87–5.11)
RDW: 14 % (ref 11.5–15.5)
WBC: 8.3 10*3/uL (ref 4.0–10.5)

## 2017-06-23 LAB — BASIC METABOLIC PANEL
Anion gap: 11 (ref 5–15)
BUN: 18 mg/dL (ref 6–20)
CO2: 22 mmol/L (ref 22–32)
CREATININE: 0.84 mg/dL (ref 0.44–1.00)
Calcium: 9.1 mg/dL (ref 8.9–10.3)
Chloride: 104 mmol/L (ref 101–111)
GFR calc non Af Amer: >60 mL/min (ref >60)
Glucose, Bld: 117 mg/dL — ABNORMAL HIGH (ref 65–99)
POTASSIUM: 4 mmol/L (ref 3.5–5.1)
Sodium: 137 mmol/L (ref 135–145)

## 2017-06-23 NOTE — Patient Instructions (Addendum)
Your procedure is scheduled on: Wednesday June 25, 2017 at 1:45 pm  Enter through the Main Entrance of [facility] at: 12:15 pm  Pick up the phone at the desk and dial [phone].  Call this number if you have problems the morning of surgery: [phone].  Remember: Do NOT eat food: after Midnight on Tuesday December 18 Do NOT drink clear liquids after: Take these medicines the morning of surgery with a SIP OF WATER: Lipitor, Prilosec, Flonase spray  Do NOT wear jewelry (body piercing), metal hair clips/bobby pins, make-up, or nail polish. Do NOT wear lotions, powders, or perfumes.  You may wear deoderant. Do NOT shave for 48 hours prior to surgery. Do NOT bring valuables to the hospital. Contacts, dentures, or bridgework may not be worn into surgery.  Have a responsible adult drive you home and stay with you for 24 hours after your procedure

## 2017-06-24 NOTE — H&P (Signed)
83yo PM female who presents for [facility], D&C possible polypectomy due to postmenopausal bleeding.  She reports maybe 4 episodes- it is always bright red blood on her pad, not enough to soak through the pad. Also when she voids she notes some clumps/tissue- states these are pea-sized. She is still having some clear discharge. Denies itching or odor.  In review, she was noted to have postmenopausl bleeding several months ago. Initially, it was unclear if it was discharge or spotting. An US performed on 9/25 showed 6.9cm uterus thickened and fluid filled endometrium with irregular hyperechoic mass 3.2cm Neither ovary well visualized Based on above- pt then had an SHG and EMB. SHG showed: Irregular shaped hyperechoic mass at anterior wall 3.1x2.1cm with irregular borders. EMB was inadequate as the sample was only form the LUS.  Pt has stopped taking her blood thinner. She reports no acute complaints.   Current Medications  Taking   Lisinopril 40MG  Tablet 1 tablet Orally Once a day   Omeprazole 40 MG Capsule Delayed Release 1 capsule Orally once a day   Hydrocodone-Acetaminophen 10-325 MG Tablet 1 tablet as needed for pain Orally every 6 hrs as needed for pain   Cipro(Ciprofloxacin) 250 MG Tablet 1 tablet Orally every 12 hrs for UTI   Voltaren(Diclofenac Sodium (Ophth)) 1 % Gel 3 grams Externally four times a day as needed to affected joint   Fluticasone Propionate 50 MCG/ACT Suspension 2 sprays in each nostril Nasally Once a day   Lipitor(Atorvastatin Calcium) 40MG  Tablet 1 tablet Orally Once a day   Not-Taking   Aggrenox(Aspirin-Dipyridamole ER) 25-200 MG Capsule Extended Release 12 Hour 1 capsule Orally twice a day   Discontinued   Diflucan(Fluconazole) 150 MG Tablet 1 tablet Orally Once a day   Medication List reviewed and reconciled with the patient    Past Medical History  Hypertension.   Hypercholesterolemia.   cerebrovascular disease, CVA 12/04.   Hereditary spastic paraparesis.   Gout.    Esophageal reflux.   Allergic rhinitis.   Migraine headaches.   Osteoporosis.   Depression.   Ortho- Dr. Delilah Shan.   [facility] [employer].           Surgical History  Left Rotator Cuff 02/1998  Right Rotator Cuff 03/2000  Stroke Left Brain,small 05/1999  Kidney Stone [place]  tubal ligation and appendectomy   ERCP for removal of common duct stones and placement of stent, Dr. Watt Climes 03/2009  ERCP were removed with stent, multiple stones and sludge removed, Dr. Watt Climes 05/2009  Cataract surgery Dr Charise Killian, right July, left August 7& 02/2010   EGD 2017   Family History  Father: deceased, cardiac arrest  Mother: deceased, diagnosed with Hypertension, Coronary artery disease  pt denies fam hx of colon cancer, polyps or liver dx. \/dw , denies any GYN family cancer hx.   Social History  General:  Tobacco use  cigarettes: Never smoked Tobacco history last updated 06/20/2017 no EXPOSURE TO PASSIVE SMOKE.  no Alcohol, none.  Caffeine: 1 serving daily, soda.  no Recreational drug use.  DIET: drinks water.  Exercise: tries to be active daily.  Marital Status: widowed.  Children: 2, Boys.  EDUCATION: 10th grade.  OCCUPATION: retired.    Gyn History  Sexual activity not currently sexually active.  Periods : postmenopausal.  Denies H/O LMP.  Denies H/O Birth control.  Last pap smear date unsure.  Last mammogram date 2015.  Denies H/O Abnormal pap smear.  Denies H/O STD.    OB History  Pregnancy # 1 live birth, vaginal delivery.  Pregnancy # 2 live birth, vaginal delivery.    Allergies  Penicillin: hives  Ultram: tongue swells  Motrin: tongue swells  Bactrim: itching   Hospitalization/Major Diagnostic Procedure  ER visit, GI issues, not admitted 02/15/09  gall stones 03/2009  not in past yr 02/2016  not in past yr 12/2016   Review of Systems  CONSTITUTIONAL:  no Chills. no Fever. no Night sweats.  HEENT:  Blurrred vision no. no Double  vision.  CARDIOLOGY:  no Chest pain.  RESPIRATORY:  no Shortness of breath. no Cough.  UROLOGY:  no Urinary frequency. no Urinary incontinence. no Urinary urgency.  GASTROENTEROLOGY:  no Abdominal pain. no Appetite change. no Change in bowel movements.  FEMALE REPRODUCTIVE:  no Breast lumps or discharge. no Breast pain.  NEUROLOGY:  no Dizziness. no Headache. no Loss of consciousness.  PSYCHOLOGY:  no Anxiety. no Depression.  SKIN:  no Rash. no Hives.  HEMATOLOGY/LYMPH:  no Anemia. no Fatigue. Using Blood Thinners no.     Vital Signs  Wt w/c, Ht 63, Pulse sitting 68, BP sitting 120/68.   Examination  General Examination: GENERAL APPEARANCE well developed, well nourished .  SKIN: warm and dry, no rashes .  NECK: supple, normal appearance .  LUNGS: regular breathing rate and effort .  ABDOMEN: soft and not tender, no masses palpated, no rebound, no rigidity.  FEMALE GENITOURINARY: normal external genitalia,labia - unremarkable,vagina - pink moist mucosa, no lesions or abnormal discharge,adnexa - no masses or tenderness,uterus - nontender and normal size on palpation.  MUSCULOSKELETAL no calf tenderness bilaterally, bilateral atrophy noted.  EXTREMITIES: no edema present, limited mobility.  PSYCH: appropriate mood and affect .     Results for orders placed or performed during the hospital encounter of 06/23/17 (from the past 24 hour(s))  CBC     Status: None   Collection Time: 06/23/17  1:45 PM  Result Value Ref Range   WBC 8.3 4.0 - 10.5 K/uL   RBC 4.27 3.87 - 5.11 MIL/uL   Hemoglobin 13.0 12.0 - 15.0 g/dL   HCT 40.3 36.0 - 46.0 %   MCV 94.4 78.0 - 100.0 fL   MCH 30.4 26.0 - 34.0 pg   MCHC 32.3 30.0 - 36.0 g/dL   RDW 14.0 11.5 - 15.5 %   Platelets 156 150 - 400 K/uL  Basic metabolic panel     Status: Abnormal   Collection Time: 06/23/17  1:45 PM  Result Value Ref Range   Sodium 137 135 - 145 mmol/L   Potassium 4.0 3.5 - 5.1 mmol/L   Chloride 104 101 - 111 mmol/L    CO2 22 22 - 32 mmol/L   Glucose, Bld 117 (H) 65 - 99 mg/dL   BUN 18 6 - 20 mg/dL   Creatinine, Ser 0.84 0.44 - 1.00 mg/dL   Calcium 9.1 8.9 - 10.3 mg/dL   GFR calc non Af Amer >60 >60 mL/min   GFR calc Af Amer >60 >60 mL/min   Anion gap 11 5 - 15   A/P: 83yo postmenopausal female who presents for [facility], D&C, possible polypectomy -NPO -LR @ 125cc/hr -Pt has been off blood thinner over 7 days -CBC, CMP as above -SCDs to OR -Risk,benefit and alternatives reviewed with patient including but not limited to risk of bleeding, infection and uterine perforation. Questions and concerns were addressed and she wishes to proceed.  Janyth Pupa, DO [phone] (cell) [phone] (office)

## 2017-06-25 ENCOUNTER — Ambulatory Visit (HOSPITAL_COMMUNITY): Payer: Medicare Other | Admitting: Certified Registered Nurse Anesthetist

## 2017-06-25 ENCOUNTER — Ambulatory Visit (HOSPITAL_COMMUNITY)
Admission: AD | Admit: 2017-06-25 | Discharge: 2017-06-25 | Disposition: A | Discharge status: Home / Self Care | Payer: Medicare Other | Source: Ambulatory Visit | Attending: Obstetrics & Gynecology | Admitting: Obstetrics & Gynecology

## 2017-06-25 ENCOUNTER — Encounter (HOSPITAL_COMMUNITY)
Admission: AD | Disposition: A | Discharge status: Home / Self Care | Payer: Self-pay | Source: Ambulatory Visit | Attending: Obstetrics & Gynecology

## 2017-06-25 ENCOUNTER — Encounter (HOSPITAL_COMMUNITY): Payer: Self-pay

## 2017-06-25 DIAGNOSIS — Z882 Allergy status to sulfonamides status: Secondary | ICD-10-CM | POA: Insufficient documentation

## 2017-06-25 DIAGNOSIS — M810 Age-related osteoporosis without current pathological fracture: Secondary | ICD-10-CM | POA: Insufficient documentation

## 2017-06-25 DIAGNOSIS — G114 Hereditary spastic paraplegia: Secondary | ICD-10-CM | POA: Diagnosis not present

## 2017-06-25 DIAGNOSIS — R9389 Abnormal findings on diagnostic imaging of other specified body structures: Secondary | ICD-10-CM | POA: Diagnosis not present

## 2017-06-25 DIAGNOSIS — I2510 Atherosclerotic heart disease of native coronary artery without angina pectoris: Secondary | ICD-10-CM | POA: Insufficient documentation

## 2017-06-25 DIAGNOSIS — C541 Malignant neoplasm of endometrium: Secondary | ICD-10-CM | POA: Insufficient documentation

## 2017-06-25 DIAGNOSIS — Z880 Allergy status to penicillin: Secondary | ICD-10-CM | POA: Insufficient documentation

## 2017-06-25 DIAGNOSIS — M109 Gout, unspecified: Secondary | ICD-10-CM | POA: Diagnosis not present

## 2017-06-25 DIAGNOSIS — E785 Hyperlipidemia, unspecified: Secondary | ICD-10-CM | POA: Diagnosis not present

## 2017-06-25 DIAGNOSIS — E7800 Pure hypercholesterolemia, unspecified: Secondary | ICD-10-CM | POA: Diagnosis not present

## 2017-06-25 DIAGNOSIS — N840 Polyp of corpus uteri: Secondary | ICD-10-CM | POA: Diagnosis not present

## 2017-06-25 DIAGNOSIS — Z7982 Long term (current) use of aspirin: Secondary | ICD-10-CM | POA: Diagnosis not present

## 2017-06-25 DIAGNOSIS — I10 Essential (primary) hypertension: Secondary | ICD-10-CM | POA: Diagnosis not present

## 2017-06-25 DIAGNOSIS — F329 Major depressive disorder, single episode, unspecified: Secondary | ICD-10-CM | POA: Diagnosis not present

## 2017-06-25 DIAGNOSIS — Z8673 Personal history of transient ischemic attack (TIA), and cerebral infarction without residual deficits: Secondary | ICD-10-CM | POA: Diagnosis not present

## 2017-06-25 DIAGNOSIS — I25110 Atherosclerotic heart disease of native coronary artery with unstable angina pectoris: Secondary | ICD-10-CM | POA: Diagnosis not present

## 2017-06-25 DIAGNOSIS — N950 Postmenopausal bleeding: Secondary | ICD-10-CM | POA: Diagnosis not present

## 2017-06-25 DIAGNOSIS — Z79899 Other long term (current) drug therapy: Secondary | ICD-10-CM | POA: Diagnosis not present

## 2017-06-25 DIAGNOSIS — Z886 Allergy status to analgesic agent status: Secondary | ICD-10-CM | POA: Insufficient documentation

## 2017-06-25 DIAGNOSIS — Z885 Allergy status to narcotic agent status: Secondary | ICD-10-CM | POA: Insufficient documentation

## 2017-06-25 DIAGNOSIS — K219 Gastro-esophageal reflux disease without esophagitis: Secondary | ICD-10-CM | POA: Diagnosis not present

## 2017-06-25 HISTORY — PX: DILATATION & CURETTAGE/HYSTEROSCOPY WITH MYOSURE: SHX6511

## 2017-06-25 SURGERY — DILATATION AND CURETTAGE /HYSTEROSCOPY
Anesthesia: Choice

## 2017-06-25 SURGERY — DILATATION & CURETTAGE/HYSTEROSCOPY WITH MYOSURE
Anesthesia: General | Site: Vagina

## 2017-06-25 MED ORDER — LACTATED RINGERS IV SOLN
INTRAVENOUS | Status: DC
  Administered 2017-06-25: 125 mL/h via INTRAVENOUS

## 2017-06-25 MED ORDER — LACTATED RINGERS IV SOLN: INTRAVENOUS | Status: DC

## 2017-06-25 MED ORDER — ONDANSETRON HCL 4 MG/2ML IJ SOLN
INTRAMUSCULAR | Status: AC
  Filled 2017-06-25: qty 2

## 2017-06-25 MED ORDER — DEXAMETHASONE SODIUM PHOSPHATE 4 MG/ML IJ SOLN
INTRAMUSCULAR | Status: AC
  Filled 2017-06-25: qty 1

## 2017-06-25 MED ORDER — MEPERIDINE HCL 25 MG/ML IJ SOLN: 6.2500 mg | INTRAMUSCULAR | Status: DC | PRN

## 2017-06-25 MED ORDER — FENTANYL CITRATE (PF) 100 MCG/2ML IJ SOLN
INTRAMUSCULAR | Status: DC | PRN
  Administered 2017-06-25: 25 ug via INTRAVENOUS

## 2017-06-25 MED ORDER — EPHEDRINE 5 MG/ML INJ
INTRAVENOUS | Status: AC
  Filled 2017-06-25: qty 10

## 2017-06-25 MED ORDER — LIDOCAINE HCL (CARDIAC) 20 MG/ML IV SOLN: INTRAVENOUS | Status: DC | PRN

## 2017-06-25 MED ORDER — PROPOFOL 10 MG/ML IV BOLUS
INTRAVENOUS | Status: DC | PRN
  Administered 2017-06-25: 120 mg via INTRAVENOUS

## 2017-06-25 MED ORDER — EPHEDRINE SULFATE 50 MG/ML IJ SOLN
INTRAMUSCULAR | Status: DC | PRN
  Administered 2017-06-25 (×2): 10 mg via INTRAVENOUS

## 2017-06-25 MED ORDER — LIDOCAINE HCL (CARDIAC) 20 MG/ML IV SOLN
INTRAVENOUS | Status: DC | PRN
  Administered 2017-06-25: 60 mg via INTRAVENOUS

## 2017-06-25 MED ORDER — FENTANYL CITRATE (PF) 100 MCG/2ML IJ SOLN: 25.0000 ug | INTRAMUSCULAR | Status: DC | PRN

## 2017-06-25 MED ORDER — PROPOFOL 10 MG/ML IV BOLUS
INTRAVENOUS | Status: AC
  Filled 2017-06-25: qty 20

## 2017-06-25 MED ORDER — LIDOCAINE-EPINEPHRINE 1 %-1:100000 IJ SOLN
INTRAMUSCULAR | Status: AC
Start: 2017-06-25 — End: ?
  Filled 2017-06-25: qty 1

## 2017-06-25 MED ORDER — LIDOCAINE HCL (CARDIAC) 20 MG/ML IV SOLN
INTRAVENOUS | Status: AC
  Filled 2017-06-25: qty 5

## 2017-06-25 MED ORDER — ONDANSETRON HCL 4 MG/2ML IJ SOLN
INTRAMUSCULAR | Status: DC | PRN
  Administered 2017-06-25: 4 mg via INTRAVENOUS

## 2017-06-25 MED ORDER — METOCLOPRAMIDE HCL 5 MG/ML IJ SOLN: 10.0000 mg | Freq: Once | INTRAMUSCULAR | Status: DC | PRN

## 2017-06-25 MED ORDER — FENTANYL CITRATE (PF) 100 MCG/2ML IJ SOLN
INTRAMUSCULAR | Status: AC
  Filled 2017-06-25: qty 2

## 2017-06-25 MED ORDER — LIDOCAINE-EPINEPHRINE 1 %-1:100000 IJ SOLN
INTRAMUSCULAR | Status: DC | PRN
  Administered 2017-06-25: 20 mL

## 2017-06-25 SURGICAL SUPPLY — 18 items
CANISTER SUCT 3000ML PPV (MISCELLANEOUS) ×3 IMPLANT
CATH ROBINSON RED A/P 16FR (CATHETERS) ×3 IMPLANT
CONTAINER PREFILL 10% NBF 60ML (FORM) ×4 IMPLANT
DEVICE MYOSURE LITE (MISCELLANEOUS) IMPLANT
DEVICE MYOSURE REACH (MISCELLANEOUS) IMPLANT
DILATOR CANAL MILEX (MISCELLANEOUS) ×2 IMPLANT
GLOVE BIOGEL PI IND STRL 6.5 (GLOVE) ×1 IMPLANT
GLOVE BIOGEL PI IND STRL 7.0 (GLOVE) ×1 IMPLANT
GLOVE BIOGEL PI INDICATOR 6.5 (GLOVE) ×2
GLOVE BIOGEL PI INDICATOR 7.0 (GLOVE) ×2
GLOVE ECLIPSE 6.5 STRL STRAW (GLOVE) ×3 IMPLANT
GOWN STRL REUS W/TWL LRG LVL3 (GOWN DISPOSABLE) ×6 IMPLANT
PACK VAGINAL MINOR WOMEN LF (CUSTOM PROCEDURE TRAY) ×3 IMPLANT
PAD OB MATERNITY 4.3X12.25 (PERSONAL CARE ITEMS) ×3 IMPLANT
SEAL ROD LENS SCOPE MYOSURE (ABLATOR) ×3 IMPLANT
TOWEL OR 17X24 6PK STRL BLUE (TOWEL DISPOSABLE) ×6 IMPLANT
TUBING AQUILEX INFLOW (TUBING) ×3 IMPLANT
TUBING AQUILEX OUTFLOW (TUBING) ×3 IMPLANT

## 2017-06-25 NOTE — Transfer of Care (Signed)
Immediate Anesthesia Transfer of Care Note  Patient: Vicki Stein  Procedure(s) Performed: DILATATION & CURETTAGE/HYSTEROSCOPY (N/A Vagina )  Patient Location: PACU  Anesthesia Type:General  Level of Consciousness: awake, alert  and oriented  Airway & Oxygen Therapy: Patient Spontanous Breathing and Patient connected to nasal cannula oxygen  Post-op Assessment: Report given to RN, Post -op Vital signs reviewed and stable and Patient moving all extremities  Post vital signs: Reviewed and stable  Last Vitals:  Vitals:   06/25/17 1146  BP: (!) 160/62  Pulse: 69  Resp: 16  Temp: 36.8 C  SpO2: 100%    Last Pain:  Vitals:   06/25/17 1146  TempSrc: Oral  PainSc: 3       Patients Stated Pain Goal: 3 (45/99/77 4142)  Complications: No apparent anesthesia complications

## 2017-06-25 NOTE — Discharge Instructions (Addendum)
HOME INSTRUCTIONS  Please note any unusual or excessive bleeding, pain, swelling. Mild dizziness or drowsiness are normal for about 24 hours after surgery.   Shower when comfortable  Restrictions: No driving for 24 hours or while taking pain medications.  Activity:  No heavy lifting (> 10 lbs), nothing in vagina (no tampons, douching, or intercourse) x 2 weeks; no tub baths for 2 weeks Vaginal spotting is expected but if your bleeding is heavy, period like,  please call the office   Diet:  You may return to your regular diet.  Do not eat large meals.  Eat small frequent meals throughout the day.  Continue to drink a good amount of water at least 6-8 glasses of water per day, hydration is very important for the healing process.  Pain Management: Take Tylenol as needed for pain.  Always take prescription pain medication with food, it may cause constipation, increase fluids and fiber and you may want to take an over-the-counter stool softener like Colace as needed up to 2x a day.    Alcohol -- Avoid for 24 hours and while taking pain medications.  Nausea: Take sips of ginger ale or soda  Fever -- Call physician if temperature over 101 degrees  Follow up:  If you do not already have a follow up appointment scheduled, please call the office at [phone].  If you experience fever (a temperature greater than 100.4), pain unrelieved by pain medication, shortness of breath, swelling of a single leg, or any other symptoms which are concerning to you please the office immediately.   Post Anesthesia Home Care Instructions  Activity: Get plenty of rest for the remainder of the day. A responsible individual must stay with you for 24 hours following the procedure.  For the next 24 hours, DO NOT: -Drive a car -Paediatric nurse -Drink alcoholic beverages -Take any medication unless instructed by your physician -Make any legal decisions or sign important papers.  Meals: Start with liquid  foods such as gelatin or soup. Progress to regular foods as tolerated. Avoid greasy, spicy, heavy foods. If nausea and/or vomiting occur, drink only clear liquids until the nausea and/or vomiting subsides. Call your physician if vomiting continues.  Special Instructions/Symptoms: Your throat may feel dry or sore from the anesthesia or the breathing tube placed in your throat during surgery. If this causes discomfort, gargle with warm salt water. The discomfort should disappear within 24 hours.

## 2017-06-25 NOTE — Op Note (Signed)
Operative Report  PreOp: postmenopausal bleeding PostOp: same and thickened endometrium Procedure:  Hysteroscopy, Dilation and Curettage, Endometrial ablation Surgeon: Dr. Janyth Pupa Anesthesia: General Complications:none EBL: 56LO Discrepancy: 125cc  Findings:6.5 cm anteverted uterus with thickened endometrium- due to significant bleeding, visualization of the endometrium was difficult  Specimens: endometrial curettings  Procedure: The patient was taken to the operating room where she underwent gneral anesthesia without difficulty. The patient was placed in a low lithotomy position using Allen stirrups. She was prepped and draped in the normal sterile fashion. A sterile speculum was inserted into the vagina. A single tooth tenaculum was placed on the anterior lip of the cervix. The uterus was then sounded to 6.5cm. The endocervical canal was then serially dilated using Hegar dilators.  The diagnostic hysteroscope was then inserted without difficulty and noted to have the findings as listed above.  Based on the amount of bleeding, discrete visualization of the mass was not obtained; however, adequate sampling was performed.  The hysteroscope was removed and sharp curettage was performed. The tissue was sent to pathology. All instrument were then removed. Hemostasis was observed at the cervical site. The patient was repositioned to the supine position. The patient tolerated the procedure without any complications and taken to recovery in stable condition.   Janyth Pupa, DO [phone] (pager) [phone] (office)

## 2017-06-25 NOTE — Interval H&P Note (Signed)
History and Physical Interval Note:  06/25/2017 12:51 PM  Vicki Stein  has presented today for surgery, with the diagnosis of polyp  The various methods of treatment have been discussed with the patient and family. After consideration of risks, benefits and other options for treatment, the patient has consented to  Procedure(s): [place] (N/A) as a surgical intervention .  The patient's history has been reviewed, patient examined, no change in status, stable for surgery.  I have reviewed the patient's chart and labs.  Questions were answered to the patient's satisfaction.     Annalee Genta

## 2017-06-25 NOTE — Anesthesia Procedure Notes (Signed)
Procedure Name: LMA Insertion Date/Time: 06/25/2017 1:50 PM Performed by: Hewitt Blade, CRNA Pre-anesthesia Checklist: Patient identified, Emergency Drugs available, Suction available and Patient being monitored Patient Re-evaluated:Patient Re-evaluated prior to induction Oxygen Delivery Method: Circle system utilized Preoxygenation: Pre-oxygenation with 100% oxygen Induction Type: IV induction LMA: LMA inserted LMA Size: 3.0 Number of attempts: 1 Placement Confirmation: positive ETCO2 and breath sounds checked- equal and bilateral Tube secured with: Tape Dental Injury: Teeth and Oropharynx as per pre-operative assessment

## 2017-06-25 NOTE — Anesthesia Postprocedure Evaluation (Signed)
Anesthesia Post Note  Patient: Vicki Stein  Procedure(s) Performed: DILATATION & CURETTAGE/HYSTEROSCOPY (N/A Vagina )     Patient location during evaluation: PACU Anesthesia Type: General Level of consciousness: awake and alert Pain management: pain level controlled Vital Signs Assessment: post-procedure vital signs reviewed and stable Respiratory status: spontaneous breathing, nonlabored ventilation, respiratory function stable and patient connected to nasal cannula oxygen Cardiovascular status: blood pressure returned to baseline and stable Postop Assessment: no apparent nausea or vomiting Anesthetic complications: no    Last Vitals:  Vitals:   06/25/17 1500 06/25/17 1515  BP:  (!) 156/72  Pulse: 91 87  Resp: 17 (!) 27  Temp:    SpO2: 97% 98%    Last Pain:  Vitals:   06/25/17 1515  TempSrc:   PainSc: 0-No pain   Pain Goal: Patients Stated Pain Goal: 3 (06/25/17 1515)               Montez Hageman

## 2017-06-25 NOTE — Anesthesia Preprocedure Evaluation (Signed)
Anesthesia Evaluation  Patient identified by MRN, date of birth, ID band Patient awake    Reviewed: Allergy & Precautions, NPO status , Patient's Chart, lab work & pertinent test results  Airway Mallampati: II  TM Distance: >3 FB Neck ROM: Full    Dental no notable dental hx. (+) Missing   Pulmonary neg pulmonary ROS,    Pulmonary exam normal breath sounds clear to auscultation       Cardiovascular hypertension, Pt. on medications + CAD  Normal cardiovascular exam Rhythm:Regular Rate:Normal     Neuro/Psych paraplegic TIACVA, Residual Symptoms negative neurological ROS  negative psych ROS   GI/Hepatic negative GI ROS, Neg liver ROS,   Endo/Other  negative endocrine ROS  Renal/GU negative Renal ROS  negative genitourinary   Musculoskeletal negative musculoskeletal ROS (+)   Abdominal   Peds negative pediatric ROS (+)  Hematology negative hematology ROS (+)   Anesthesia Other Findings   Reproductive/Obstetrics negative OB ROS                             Anesthesia Physical Anesthesia Plan  ASA: III  Anesthesia Plan: General   Post-op Pain Management:    Induction: Intravenous  PONV Risk Score and Plan: 3 and Treatment may vary due to age or medical condition  Airway Management Planned: LMA  Additional Equipment:   Intra-op Plan:   Post-operative Plan:   Informed Consent: I have reviewed the patients History and Physical, chart, labs and discussed the procedure including the risks, benefits and alternatives for the proposed anesthesia with the patient or authorized representative who has indicated his/her understanding and acceptance.   Dental advisory given  Plan Discussed with: CRNA  Anesthesia Plan Comments:         Anesthesia Quick Evaluation

## 2017-06-26 ENCOUNTER — Encounter (HOSPITAL_COMMUNITY): Payer: Self-pay | Admitting: Obstetrics & Gynecology

## 2017-06-27 ENCOUNTER — Telehealth: Payer: Self-pay | Admitting: *Deleted

## 2017-06-27 NOTE — Telephone Encounter (Signed)
[facility] and gave the appt for January 4th at 11am with Dr. Fermin Schwab

## 2017-07-04 DIAGNOSIS — Z9889 Other specified postprocedural states: Secondary | ICD-10-CM | POA: Diagnosis not present

## 2017-07-04 DIAGNOSIS — Z7901 Long term (current) use of anticoagulants: Secondary | ICD-10-CM | POA: Diagnosis not present

## 2017-07-04 DIAGNOSIS — C541 Malignant neoplasm of endometrium: Secondary | ICD-10-CM | POA: Diagnosis not present

## 2017-07-11 ENCOUNTER — Encounter: Payer: Self-pay | Admitting: Gynecology

## 2017-07-11 ENCOUNTER — Ambulatory Visit: Payer: Medicare Other | Attending: Gynecology | Admitting: Gynecology

## 2017-07-11 VITALS — BP 137/46 | HR 64 | Temp 98.1°F | Resp 20 | Ht 60.0 in | Wt 139.6 lb

## 2017-07-11 DIAGNOSIS — Z882 Allergy status to sulfonamides status: Secondary | ICD-10-CM | POA: Insufficient documentation

## 2017-07-11 DIAGNOSIS — E785 Hyperlipidemia, unspecified: Secondary | ICD-10-CM | POA: Insufficient documentation

## 2017-07-11 DIAGNOSIS — K219 Gastro-esophageal reflux disease without esophagitis: Secondary | ICD-10-CM | POA: Diagnosis not present

## 2017-07-11 DIAGNOSIS — I10 Essential (primary) hypertension: Secondary | ICD-10-CM | POA: Diagnosis not present

## 2017-07-11 DIAGNOSIS — Z8673 Personal history of transient ischemic attack (TIA), and cerebral infarction without residual deficits: Secondary | ICD-10-CM | POA: Insufficient documentation

## 2017-07-11 DIAGNOSIS — I2510 Atherosclerotic heart disease of native coronary artery without angina pectoris: Secondary | ICD-10-CM | POA: Insufficient documentation

## 2017-07-11 DIAGNOSIS — Z886 Allergy status to analgesic agent status: Secondary | ICD-10-CM | POA: Insufficient documentation

## 2017-07-11 DIAGNOSIS — Z7982 Long term (current) use of aspirin: Secondary | ICD-10-CM | POA: Insufficient documentation

## 2017-07-11 DIAGNOSIS — R7303 Prediabetes: Secondary | ICD-10-CM | POA: Insufficient documentation

## 2017-07-11 DIAGNOSIS — I739 Peripheral vascular disease, unspecified: Secondary | ICD-10-CM | POA: Insufficient documentation

## 2017-07-11 DIAGNOSIS — Z79899 Other long term (current) drug therapy: Secondary | ICD-10-CM | POA: Insufficient documentation

## 2017-07-11 DIAGNOSIS — G8220 Paraplegia, unspecified: Secondary | ICD-10-CM | POA: Diagnosis not present

## 2017-07-11 DIAGNOSIS — C541 Malignant neoplasm of endometrium: Secondary | ICD-10-CM

## 2017-07-11 DIAGNOSIS — Z880 Allergy status to penicillin: Secondary | ICD-10-CM | POA: Diagnosis not present

## 2017-07-11 DIAGNOSIS — M109 Gout, unspecified: Secondary | ICD-10-CM | POA: Insufficient documentation

## 2017-07-11 DIAGNOSIS — Z9049 Acquired absence of other specified parts of digestive tract: Secondary | ICD-10-CM | POA: Diagnosis not present

## 2017-07-11 NOTE — Patient Instructions (Signed)
Follow up with Dr. Truman Hayward about surgery clearance.  Once a decision has been made, we will make you an appointment in the office to discuss options.

## 2017-07-11 NOTE — Progress Notes (Signed)
Consult Note: Gyn-Onc   Vicki Stein 84 y.o. female  Chief Complaint  Patient presents with  . Endometrial adenocarcinoma (HCC)    Assessment : Grade 2 endometrial adenocarcinoma.  Multiple medical comorbidities.  Plan: I informed the patient and her niece that the standard treatment for endometrial cancer is a hysterectomy.Marland Kitchen  However given her cerebrovascular disease and other medical comorbidities, I would recommend that she be reevaluated by a neurologist regarding preoperative clearance.  Consideration must be given to whether the patient can be placed in steep Trendelenburg position as well as the risks of general anesthesia and surgical recovery.  If the risk of surgery is deemed to be excessive then consideration of placement of her Mirena IUD would be our second option.  We will asked the patient to see her primary care physician Dr.Thao Marin Comment next week for evaluation and possible referral to a neurologist.  Once we have heard back from Dr. Marin Comment we will make further decisions regarding management of her endometrial cancer.  HPI: Patient initially presented with several weeks of a brownish vaginal discharge.  Ultimately, she underwent hysteroscopy and D&C on June 25, 2017.  Final pathology showed an adenocarcinoma of the endometrium grade 2.  Initially the patient had some postoperative bleeding but that has resolved.  She denies any other gynecologic history.  Gravida 2 para 2.  Patient has a number of medical comorbidities including a past history of CVA in December 2004, hereditary spastic paraparesis, hypertension, gout, and some arthritic problems.  Review of Systems:10 point review of systems is negative except as noted in interval history.   Vitals: Blood pressure (!) 137/46, pulse 64, temperature 98.1 F (36.7 C), temperature source Oral, resp. rate 20, height 5' (1.524 m), weight 139 lb 9.6 oz (63.3 kg), SpO2 97 %.  Physical Exam: General : The patient is a healthy woman  in no acute distress.  HEENT: normocephalic, extraoccular movements normal; neck is supple without thyromegally  Lynphnodes: Supraclavicular and inguinal nodes not enlarged  Abdomen: Soft, non-tender, no ascites, no organomegally, no masses, no hernias  Pelvic:  EGBUS: Normal female  Vagina: Normal, no lesions  Urethra and Bladder: Normal, non-tender  Cervix: Flush with the upper vagina.  No lesions are noted. Uterus: Anterior normal shape size and consistency Bi-manual examination: Non-tender; no adenxal masses or nodularity  Rectal: normal sphincter tone, no masses, no blood  Lower extremities: Are relatively rigid and the patient is unable to assume much aduction.     Allergies  Allergen Reactions  . Aleve [Naproxen Sodium] Itching and Swelling  . Bactrim [Sulfamethoxazole-Trimethoprim] Itching and Other (See Comments)    Had a bad reaction, can't remember, worked me over  . Motrin [Ibuprofen] Itching and Swelling  . Penicillins Hives, Itching and Other (See Comments)    Has patient had a PCN reaction causing immediate rash, facial/tongue/throat swelling, SOB or lightheadedness with hypotension: No Has patient had a PCN reaction causing severe rash involving mucus membranes or skin necrosis: No Has patient had a PCN reaction that required hospitalization No Has patient had a PCN reaction occurring within the last 10 years: No If all of the above answers are "NO", then may proceed with Cephalosporin use.  Marland Kitchen Ultram [Tramadol] Other (See Comments)    Unknown - on list of allergies, but dog takes    Past Medical History:  Diagnosis Date  . Arthritis    all over  . Back pain   . Blood dyscrasia    on blood thiners  .  Borderline diabetes   . Cerebrovascular disease   . Coronary artery disease    a. LHC (9/14):  pLM 30, oD1 50, D2 40-50, pRI 30, mCFX 30, mRCA 30, EF 65%  . GERD (gastroesophageal reflux disease)   . Gout   . Hemorrhoids   . High cholesterol   . History of  kidney stones    60 years ago  . Hyperlipemia   . Hypertension   . Migraines   . Movement disorder   . 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   Marland Kitchen Peripheral vascular disease (HCC)    poor circulation lower legs  . Pneumonia    30 years ago  . Pre-diabetes    borderline  . Reflux   . Stroke [facility])    " 5 mini strokes"    Past Surgical History:  Procedure Laterality Date  . APPENDECTOMY    . CATARACT EXTRACTION W/ INTRAOCULAR LENS  IMPLANT, BILATERAL    . CHOLECYSTECTOMY    . COLONOSCOPY    . DILATATION & CURETTAGE/HYSTEROSCOPY WITH MYOSURE N/A 06/25/2017   Procedure: DILATATION & CURETTAGE/HYSTEROSCOPY;  Surgeon: Janyth Pupa, DO;  Location: [facility];  Service: Gynecology;  Laterality: N/A;  . DILATION AND CURETTAGE OF UTERUS    . ESOPHAGOGASTRODUODENOSCOPY N/A 11/14/2015   Procedure: ESOPHAGOGASTRODUODENOSCOPY (EGD);  Surgeon: Laurence Spates, MD;  Location: [facility] ENDOSCOPY;  Service: Endoscopy;  Laterality: N/A;  . HEMORRHOID SURGERY    . LEFT HEART CATHETERIZATION WITH CORONARY ANGIOGRAM N/A 03/25/2013   Procedure: LEFT HEART CATHETERIZATION WITH CORONARY ANGIOGRAM;  Surgeon: Burnell Blanks, MD;  Location: [facility] CATH LAB;  Service: Cardiovascular;  Laterality: N/A;  . ROTATOR CUFF REPAIR    . TONSILLECTOMY    . TUBAL LIGATION      Current Outpatient Medications  Medication Sig Dispense Refill  . acetaminophen (TYLENOL) 500 MG tablet Take 500 mg by mouth every 6 (six) hours as needed for mild pain or moderate pain.    Marland Kitchen atorvastatin (LIPITOR) 40 MG tablet Take 40 mg by mouth daily.    . diclofenac sodium (VOLTAREN) 1 % GEL Apply 2 g topically 4 (four) times daily. Apply to knees, ankles, and hands, for arthritis     . dipyridamole-aspirin (AGGRENOX) 200-25 MG per 12 hr capsule Take 1 capsule by mouth 2 (two) times daily.    . fluticasone (FLONASE) 50 MCG/ACT nasal spray Place 2 sprays into both nostrils daily.     Marland Kitchen lisinopril (PRINIVIL,ZESTRIL) 40 MG tablet  Take 40 mg by mouth every morning.     Marland Kitchen omeprazole (PRILOSEC) 40 MG capsule Take 40 mg by mouth daily.     No current facility-administered medications for this visit.     Social History   Socioeconomic History  . Marital status: Widowed    Spouse name: Not on file  . Number of children: 3  . Years of education: 10th  . Highest education level: Not on file  Social Needs  . Financial resource strain: Not on file  . Food insecurity - worry: Not on file  . Food insecurity - inability: Not on file  . Transportation needs - medical: Not on file  . Transportation needs - non-medical: Not on file  Occupational History  . Occupation: retired  Tobacco Use  . Smoking status: Never Smoker  . Smokeless tobacco: Never Used  Substance and Sexual Activity  . Alcohol use: No  . Drug use: No  . Sexual activity: No  Other Topics Concern  . Not  on file  Social History Narrative   Patient lives alone, is a widow. Patient is retired. Patient has 10th grade education. Right handed.    Family History  Problem Relation Age of Onset  . Heart Problems Mother   . Heart Problems Father   . Heart attack Son       Marti Sleigh, MD 07/11/2017, 11:37 AM

## 2017-07-14 DIAGNOSIS — Z5181 Encounter for therapeutic drug level monitoring: Secondary | ICD-10-CM | POA: Diagnosis not present

## 2017-07-14 DIAGNOSIS — I10 Essential (primary) hypertension: Secondary | ICD-10-CM | POA: Diagnosis not present

## 2017-07-14 DIAGNOSIS — G894 Chronic pain syndrome: Secondary | ICD-10-CM | POA: Diagnosis not present

## 2017-07-14 DIAGNOSIS — I639 Cerebral infarction, unspecified: Secondary | ICD-10-CM | POA: Diagnosis not present

## 2017-07-14 DIAGNOSIS — C541 Malignant neoplasm of endometrium: Secondary | ICD-10-CM | POA: Diagnosis not present

## 2017-07-14 DIAGNOSIS — R7303 Prediabetes: Secondary | ICD-10-CM | POA: Diagnosis not present

## 2017-07-14 DIAGNOSIS — M12811 Other specific arthropathies, not elsewhere classified, right shoulder: Secondary | ICD-10-CM | POA: Diagnosis not present

## 2017-07-15 ENCOUNTER — Telehealth: Payer: Self-pay | Admitting: Gynecologic Oncology

## 2017-07-15 NOTE — Telephone Encounter (Signed)
Returned call to patient.  Patient's friend, Hassan Rowan, stated after discussing options with her family, the patient has decided to proceed with having an IUD placed instead of surgery.  Dr. Fermin Schwab notified of patient's desires.  He recommends ultrasound to assess endometrial stripe then follow up with Dr. Nelda Marseille for IUD placement.  He would plan to resample her endometrium in 3 months.

## 2017-07-17 ENCOUNTER — Telehealth: Payer: Self-pay | Admitting: Gynecologic Oncology

## 2017-07-17 NOTE — Telephone Encounter (Signed)
Left message for Dr. Annie Main RN to contact the office to arrange for the patient to have an IUD placed in the office with an ultrasound before for treatment of endometrial cancer.

## 2017-07-29 ENCOUNTER — Telehealth: Payer: Self-pay

## 2017-07-29 NOTE — Telephone Encounter (Signed)
Vicki Stein is set up for [place] and IUD placement with Dr. Nelda Marseille tomorrow 07-30-17. Will set up 3 month follow up with Dr. Fermin Schwab in April.

## 2017-07-30 DIAGNOSIS — Z30430 Encounter for insertion of intrauterine contraceptive device: Secondary | ICD-10-CM | POA: Diagnosis not present

## 2017-07-30 DIAGNOSIS — C541 Malignant neoplasm of endometrium: Secondary | ICD-10-CM | POA: Diagnosis not present

## 2017-07-30 DIAGNOSIS — N950 Postmenopausal bleeding: Secondary | ICD-10-CM | POA: Diagnosis not present

## 2017-07-31 ENCOUNTER — Telehealth: Payer: Self-pay | Admitting: *Deleted

## 2017-07-31 NOTE — Telephone Encounter (Signed)
Called and scheduled the patient for the follow up on April 26th at 12pm

## 2017-08-01 DIAGNOSIS — M19012 Primary osteoarthritis, left shoulder: Secondary | ICD-10-CM | POA: Diagnosis not present

## 2017-08-01 DIAGNOSIS — M19011 Primary osteoarthritis, right shoulder: Secondary | ICD-10-CM | POA: Diagnosis not present

## 2017-08-01 DIAGNOSIS — M25511 Pain in right shoulder: Secondary | ICD-10-CM | POA: Diagnosis not present

## 2017-08-01 DIAGNOSIS — M25512 Pain in left shoulder: Secondary | ICD-10-CM | POA: Diagnosis not present

## 2017-08-07 DIAGNOSIS — R079 Chest pain, unspecified: Secondary | ICD-10-CM | POA: Diagnosis not present

## 2017-08-07 DIAGNOSIS — I10 Essential (primary) hypertension: Secondary | ICD-10-CM | POA: Diagnosis not present

## 2017-08-20 DIAGNOSIS — R1032 Left lower quadrant pain: Secondary | ICD-10-CM | POA: Diagnosis not present

## 2017-08-22 ENCOUNTER — Other Ambulatory Visit: Payer: Self-pay | Admitting: Internal Medicine

## 2017-08-22 DIAGNOSIS — R1032 Left lower quadrant pain: Secondary | ICD-10-CM

## 2017-09-05 ENCOUNTER — Ambulatory Visit
Admission: RE | Admit: 2017-09-05 | Discharge: 2017-09-05 | Disposition: A | Discharge status: Home / Self Care | Payer: Medicare Other | Source: Ambulatory Visit | Attending: Internal Medicine | Admitting: Internal Medicine

## 2017-09-05 DIAGNOSIS — R1032 Left lower quadrant pain: Secondary | ICD-10-CM

## 2017-09-05 MED ORDER — IOPAMIDOL (ISOVUE-300) INJECTION 61%
100.0000 mL | Freq: Once | INTRAVENOUS | Status: AC | PRN
  Administered 2017-09-05: 100 mL via INTRAVENOUS

## 2017-09-16 DIAGNOSIS — C541 Malignant neoplasm of endometrium: Secondary | ICD-10-CM | POA: Diagnosis not present

## 2017-09-16 DIAGNOSIS — Z975 Presence of (intrauterine) contraceptive device: Secondary | ICD-10-CM | POA: Diagnosis not present

## 2017-10-31 ENCOUNTER — Encounter: Payer: Self-pay | Admitting: Gynecology

## 2017-10-31 ENCOUNTER — Inpatient Hospital Stay: Payer: Medicare Other | Attending: Gynecology | Admitting: Gynecology

## 2017-10-31 VITALS — BP 142/55 | HR 66 | Temp 98.6°F | Resp 18 | Ht 62.0 in | Wt 139.0 lb

## 2017-10-31 DIAGNOSIS — C541 Malignant neoplasm of endometrium: Secondary | ICD-10-CM | POA: Diagnosis not present

## 2017-10-31 NOTE — Patient Instructions (Addendum)
We will contact you with the results of your biopsy from today.  Plan to follow up in three months or sooner if needed.  You can use vaseline to the vulva to assist with irritation symptoms.  Please call for any needs.   Endometrial Biopsy, Care After This sheet gives you information about how to care for yourself after your procedure. Your health care provider may also give you more specific instructions. If you have problems or questions, contact your health care provider. What can I expect after the procedure? After the procedure, it is common to have:  Mild cramping.  A small amount of vaginal bleeding for a few days. This is normal.  Follow these instructions at home:  Take over-the-counter and prescription medicines only as told by your health care provider.  Do not douche, use tampons, or have sexual intercourse until your health care provider approves.  Return to your normal activities as told by your health care provider. Ask your health care provider what activities are safe for you.  Follow instructions from your health care provider about any activity restrictions, such as restrictions on strenuous exercise or heavy lifting. Contact a health care provider if:  You have heavy bleeding, or bleed for longer than 2 days after the procedure.  You have bad smelling discharge from your vagina.  You have a fever or chills.  You have a burning sensation when urinating or you have difficulty urinating.  You have severe pain in your lower abdomen. Get help right away if:  You have severe cramps in your stomach or back.  You pass large blood clots.  Your bleeding increases.  You become weak or light-headed, or you pass out. Summary  After the procedure, it is common to have mild cramping and a small amount of vaginal bleeding for a few days.  Do not douche, use tampons, or have sexual intercourse until your health care provider approves.  Return to your normal  activities as told by your health care provider. Ask your health care provider what activities are safe for you. This information is not intended to replace advice given to you by your health care provider. Make sure you discuss any questions you have with your health care provider. Document Released: 04/14/2013 Document Revised: 07/10/2016 Document Reviewed: 07/10/2016 Elsevier Interactive Patient Education  2017 [employer].

## 2017-10-31 NOTE — Progress Notes (Signed)
Consult Note: Gyn-Onc   Vicki Stein 84 y.o. female  Chief Complaint  Patient presents with  . Endometrial adenocarcinoma (HCC)    Assessment : Grade 2 endometrial adenocarcinoma.  Multiple medical comorbidities.  Plan: Endometrial biopsy is obtained.  Even if she has persistent disease (which would not be surprising) I would allow the IUD to remain in place for another 3 months and have her return for another endometrial biopsy.  She will use Vaseline on the vulva as barrier to the minimal vaginal discharge.  Interval history: Since I saw the patient last she has had an IUD placed.  She also had a CT scan that showed no evidence of metastatic disease although there was a 4.1 x 3.4 cm ill-defined low-density lesion in the uterine fundus.  Since place the IUD the patient had a small amount of vaginal discharge and some spotting.  The discharge is bothering her vulva somewhat.  HPI: Patient initially presented with several weeks of a brownish vaginal discharge.  Ultimately, she underwent hysteroscopy and D&C on June 25, 2017.  Final pathology showed an adenocarcinoma of the endometrium grade 2.  Initially the patient had some postoperative bleeding but that has resolved.  She denies any other gynecologic history.  Gravida 2 para 2.  Patient has a number of medical comorbidities including a past history of CVA in December 2004, hereditary spastic paraparesis, hypertension, gout, and some arthritic problems.  It was felt the patient was not a surgical candidate given her multiple comorbidities and therefore Mirena IUD was placed.  A CT scan was obtained on September 05, 2017 showed no evidence of metastatic disease in the peritoneal cavity or retroperitoneal lymph nodes.  There was a 4.1 x 3.4 cm ill-defined low-density lesion in the uterine fundus along with an intrauterine device.  Review of Systems:10 point review of systems is negative except as noted in interval history.   Vitals: Blood  pressure (!) 142/55, pulse 66, temperature 98.6 F (37 C), temperature source Oral, resp. rate 18, height 5\' 2"  (1.575 m), weight 139 lb (63 kg), SpO2 100 %.  Physical Exam: General : The patient is a healthy woman in no acute distress.  HEENT: normocephalic, extraoccular movements normal; neck is supple without thyromegally  Lynphnodes: Supraclavicular and inguinal nodes not enlarged  Abdomen: Soft, non-tender, no ascites, no organomegally, no masses, no hernias  Pelvic:  EGBUS: Normal female  Vagina: Normal, no lesions  Urethra and Bladder: Normal, non-tender  Cervix: Flush with the upper vagina.  IUD string is seen. Uterus: Anterior normal shape size and consistency Bi-manual examination: Non-tender; no adenxal masses or nodularity  Rectal: normal sphincter tone, no masses, no blood  Lower extremities: Are relatively rigid and the patient is unable to assume much aduction.  Procedure note: Endometrial biopsy is obtained and read removing a modest amount of tissue.  This is submitted to pathology.     Allergies  Allergen Reactions  . Aleve [Naproxen Sodium] Itching and Swelling  . Bactrim [Sulfamethoxazole-Trimethoprim] Itching and Other (See Comments)    Had a bad reaction, can't remember, worked me over  . Motrin [Ibuprofen] Itching and Swelling  . Penicillins Hives, Itching and Other (See Comments)    Has patient had a PCN reaction causing immediate rash, facial/tongue/throat swelling, SOB or lightheadedness with hypotension: No Has patient had a PCN reaction causing severe rash involving mucus membranes or skin necrosis: No Has patient had a PCN reaction that required hospitalization No Has patient had a PCN reaction occurring  within the last 10 years: No If all of the above answers are "NO", then may proceed with Cephalosporin use.  Marland Kitchen Ultram [Tramadol] Other (See Comments)    Unknown - on list of allergies, but dog takes    Past Medical History:  Diagnosis Date  .  Arthritis    all over  . Back pain   . Blood dyscrasia    on blood thiners  . Borderline diabetes   . Cerebrovascular disease   . Coronary artery disease    a. LHC (9/14):  pLM 30, oD1 50, D2 40-50, pRI 30, mCFX 30, mRCA 30, EF 65%  . GERD (gastroesophageal reflux disease)   . Gout   . Hemorrhoids   . High cholesterol   . History of kidney stones    60 years ago  . Hyperlipemia   . Hypertension   . Migraines   . Movement disorder   . 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   Marland Kitchen Peripheral vascular disease (HCC)    poor circulation lower legs  . Pneumonia    30 years ago  . Pre-diabetes    borderline  . Reflux   . Stroke [facility])    " 5 mini strokes"    Past Surgical History:  Procedure Laterality Date  . APPENDECTOMY    . CATARACT EXTRACTION W/ INTRAOCULAR LENS  IMPLANT, BILATERAL    . CHOLECYSTECTOMY    . COLONOSCOPY    . DILATATION & CURETTAGE/HYSTEROSCOPY WITH MYOSURE N/A 06/25/2017   Procedure: DILATATION & CURETTAGE/HYSTEROSCOPY;  Surgeon: Janyth Pupa, DO;  Location: [facility];  Service: Gynecology;  Laterality: N/A;  . DILATION AND CURETTAGE OF UTERUS    . ESOPHAGOGASTRODUODENOSCOPY N/A 11/14/2015   Procedure: ESOPHAGOGASTRODUODENOSCOPY (EGD);  Surgeon: Laurence Spates, MD;  Location: [facility] ENDOSCOPY;  Service: Endoscopy;  Laterality: N/A;  . HEMORRHOID SURGERY    . LEFT HEART CATHETERIZATION WITH CORONARY ANGIOGRAM N/A 03/25/2013   Procedure: LEFT HEART CATHETERIZATION WITH CORONARY ANGIOGRAM;  Surgeon: Burnell Blanks, MD;  Location: [facility] CATH LAB;  Service: Cardiovascular;  Laterality: N/A;  . ROTATOR CUFF REPAIR    . TONSILLECTOMY    . TUBAL LIGATION      Current Outpatient Medications  Medication Sig Dispense Refill  . acetaminophen (TYLENOL) 500 MG tablet Take 500 mg by mouth every 6 (six) hours as needed for mild pain or moderate pain.    Marland Kitchen atorvastatin (LIPITOR) 40 MG tablet Take 40 mg by mouth daily.    . diclofenac sodium (VOLTAREN) 1 % GEL  Apply 2 g topically 4 (four) times daily. Apply to knees, ankles, and hands, for arthritis     . dipyridamole-aspirin (AGGRENOX) 200-25 MG per 12 hr capsule Take 1 capsule by mouth daily.     . fluticasone (FLONASE) 50 MCG/ACT nasal spray Place 2 sprays into both nostrils daily.     Marland Kitchen lisinopril (PRINIVIL,ZESTRIL) 40 MG tablet Take 40 mg by mouth every morning.     Marland Kitchen omeprazole (PRILOSEC) 40 MG capsule Take 40 mg by mouth daily.     No current facility-administered medications for this visit.     Social History   Socioeconomic History  . Marital status: Widowed    Spouse name: Not on file  . Number of children: 3  . Years of education: 10th  . Highest education level: Not on file  Occupational History  . Occupation: retired  Scientific laboratory technician  . Financial resource strain: Not on file  . Food insecurity:  Worry: Not on file    Inability: Not on file  . Transportation needs:    Medical: Not on file    Non-medical: Not on file  Tobacco Use  . Smoking status: Never Smoker  . Smokeless tobacco: Never Used  Substance and Sexual Activity  . Alcohol use: No  . Drug use: No  . Sexual activity: Never  Lifestyle  . Physical activity:    Days per week: Not on file    Minutes per session: Not on file  . Stress: Not on file  Relationships  . Social connections:    Talks on phone: Not on file    Gets together: Not on file    Attends religious service: Not on file    Active member of club or organization: Not on file    Attends meetings of clubs or organizations: Not on file    Relationship status: Not on file  . Intimate partner violence:    Fear of current or ex partner: Not on file    Emotionally abused: Not on file    Physically abused: Not on file    Forced sexual activity: Not on file  Other Topics Concern  . Not on file  Social History Narrative   Patient lives alone, is a widow. Patient is retired. Patient has 10th grade education. Right handed.    Family History   Problem Relation Age of Onset  . Heart Problems Mother   . Heart Problems Father   . Heart attack Son       Marti Sleigh, MD 10/31/2017, 12:11 PM

## 2017-11-04 ENCOUNTER — Telehealth: Payer: Self-pay

## 2017-11-04 NOTE — Telephone Encounter (Signed)
Told Ms Attaway that the biopsy showed the endometrial cancer is still present in the uterus.  Dr. Fermin Schwab recommends continuing with the IUD and return in 3 months to obtain another BX.  Pt scheduled for follow up  On 02-03-18. Pt verbalized understanding.

## 2017-12-17 DIAGNOSIS — L290 Pruritus ani: Secondary | ICD-10-CM | POA: Diagnosis not present

## 2018-01-05 ENCOUNTER — Telehealth: Payer: Self-pay

## 2018-01-05 ENCOUNTER — Other Ambulatory Visit: Payer: Self-pay | Admitting: Gynecologic Oncology

## 2018-01-05 DIAGNOSIS — R102 Pelvic and perineal pain: Secondary | ICD-10-CM

## 2018-01-05 NOTE — Progress Notes (Signed)
See RN note.  Patient called with severe pelvic pain.  Hx of endo ca with IUD in place.  [place] to evaluate IUD placement and endometrium.

## 2018-01-05 NOTE — Telephone Encounter (Signed)
Vicki Stein states that she has had  Intermittent abdominal pain and cramping since IUD placed in ~07-2017. Pain can range from 0 to 9/10. Tylenol effective foe a couple of hours when pain occurs. She has recently developed increasing bloody drainage. Reviewed with Melissa Cross,NP.  Will obtain a [place] of abdomen to check IUD placement and see Dr. Fermin Schwab on 01-13-18. [place] for 01-07-18 at [facility] at 1:30 pm. Pt verbalized understanding.

## 2018-01-07 ENCOUNTER — Ambulatory Visit (HOSPITAL_COMMUNITY)
Admission: RE | Admit: 2018-01-07 | Discharge: 2018-01-07 | Disposition: A | Discharge status: Home / Self Care | Payer: Medicare Other | Source: Ambulatory Visit | Attending: Gynecologic Oncology | Admitting: Gynecologic Oncology

## 2018-01-07 DIAGNOSIS — C541 Malignant neoplasm of endometrium: Secondary | ICD-10-CM | POA: Insufficient documentation

## 2018-01-07 DIAGNOSIS — Z975 Presence of (intrauterine) contraceptive device: Secondary | ICD-10-CM | POA: Insufficient documentation

## 2018-01-07 DIAGNOSIS — R102 Pelvic and perineal pain: Secondary | ICD-10-CM

## 2018-01-13 ENCOUNTER — Encounter: Payer: Self-pay | Admitting: Gynecology

## 2018-01-13 ENCOUNTER — Inpatient Hospital Stay: Payer: Medicare Other | Attending: Gynecology | Admitting: Gynecology

## 2018-01-13 VITALS — BP 126/55 | HR 82 | Temp 98.4°F | Resp 18 | Ht 62.0 in | Wt 137.0 lb

## 2018-01-13 DIAGNOSIS — Z79818 Long term (current) use of other agents affecting estrogen receptors and estrogen levels: Secondary | ICD-10-CM | POA: Insufficient documentation

## 2018-01-13 DIAGNOSIS — C541 Malignant neoplasm of endometrium: Secondary | ICD-10-CM | POA: Diagnosis not present

## 2018-01-13 DIAGNOSIS — Z79811 Long term (current) use of aromatase inhibitors: Secondary | ICD-10-CM | POA: Diagnosis not present

## 2018-01-13 MED ORDER — MEGESTROL ACETATE 40 MG PO TABS: 40.0000 mg | ORAL_TABLET | Freq: Three times a day (TID) | ORAL | 3 refills | Status: AC

## 2018-01-13 NOTE — Patient Instructions (Signed)
Plan to begin taking Megace 40 mg three times daily and follow up with Dr. Fermin Schwab in three months or sooner if needed.  Please call for any needs.  You can increase the amount of Tylenol to three times daily (max of 4,000 mg in 24 hour period).  Megestrol tablets What is this medicine? MEGESTROL (me JES trol) belongs to a class of drugs known as progestins. Megestrol tablets are used to treat advanced breast or endometrial cancer. This medicine may be used for other purposes; ask your health care provider or pharmacist if you have questions. COMMON BRAND NAME(S): Megace What should I tell my health care provider before I take this medicine? They need to know if you have any of these conditions: -adrenal gland problems -history of blood clots of the legs, lungs, or other parts of the body -diabetes -kidney disease -liver disease -stroke -an unusual or allergic reaction to megestrol, other medicines, foods, dyes, or preservatives -pregnant or trying to get pregnant -breast-feeding How should I use this medicine? Take this medicine by mouth. Follow the directions on the prescription label. Do not take your medicine more often than directed. Take your doses at regular intervals. Do not stop taking except on the advice of your doctor or health care professional. Talk to your pediatrician regarding the use of this medicine in children. Special care may be needed. Overdosage: If you think you have taken too much of this medicine contact a poison control center or emergency room at once. NOTE: This medicine is only for you. Do not share this medicine with others. What if I miss a dose? If you miss a dose, take it as soon as you can. If it is almost time for your next dose, take only that dose. Do not take double or extra doses. What may interact with this medicine? Do not take this medicine with any of the following medications: -dofetilide This medicine may also interact with the  following medications: -carbamazepine -indinavir -phenobarbital -phenytoin -primidone -rifampin -warfarin This list may not describe all possible interactions. Give your health care provider a list of all the medicines, herbs, non-prescription drugs, or dietary supplements you use. Also tell them if you smoke, drink alcohol, or use illegal drugs. Some items may interact with your medicine. What should I watch for while using this medicine? Visit your doctor or health care professional for regular checks on your progress. Continue taking this medicine even if you feel better. It may take 2 months of regular use before you know if this medicine is working for your condition. If you are a female of child-bearing age, use an effective method of birth control while you are taking this medicine. This medicine should not be used by females who are pregnant or breast-feeding. There is a potential for serious side effects to an unborn child or to an infant. Talk to your health care professional or pharmacist for more information. If you have diabetes, this medicine may affect blood sugar levels. Check your blood sugar and talk to your doctor or health care professional if you notice changes. What side effects may I notice from receiving this medicine? Side effects that you should report to your doctor or health care professional as soon as possible: -difficulty breathing or shortness of breath -chest pain -dizziness -fluid retention -increased blood pressure -leg pain or swelling -nausea and vomiting -skin rash or itching -weakness Side effects that usually do not require medical attention (report to your doctor or health care professional if  they continue or are bothersome): -breakthrough menstrual bleeding -hot flashes or flushing -increased appetite -mood changes -sweating -weight gain This list may not describe all possible side effects. Call your doctor for medical advice about side  effects. You may report side effects to FDA at 1-800-FDA-1088. Where should I keep my medicine? Keep out of the reach of children. Store at controlled room temperature between 15 and 30 degrees C (59 and 86 degrees F). Protect from heat above 40 degrees C (104 degrees F). Throw away any unused medicine after the expiration date. NOTE: This sheet is a summary. It may not cover all possible information. If you have questions about this medicine, talk to your doctor, pharmacist, or health care provider.  2018 Elsevier/Gold Standard (2008-01-11 15:57:10)

## 2018-01-13 NOTE — Progress Notes (Signed)
Consult Note: Gyn-Onc   Vicki Stein 84 y.o. female  Chief Complaint  Patient presents with  . Endometrial adenocarcinoma (HCC)    Assessment : Grade 2 endometrial adenocarcinoma.  Multiple medical comorbidities.  Plan: We discussed treatment options with the patient and her friend.  She understands that hysterectomy would be the standard of care although given her medical comorbidities this remains a risky procedure.  The ultrasound suggests persistent disease and therefore an endometrial biopsy is not necessary today.  We will start the patient on Megace 40 mg 3 times a day.  She return to see me in 3 months or sooner if her bleeding becomes heavy.  If we cannot get a good result from Megace we may have to re-evaluate the patient's surgical status.  Interval history: Patient returns as previously scheduled.  She continues to have intermittent spotting.  An ultrasound was obtained last week showing  1. 3.2 x 2.3 x 2.6 cm hypoechoic mass at the uterine fundus, most likely reflecting patient's known endometrial carcinoma. Superimposed dystrophic calcification has developed in the interim. 2. IUD in place at the level of the uterine body. 3. 1.9 cm hypoechoic structure within the right adnexa, favored to reflect the native right ovary, similar relative to prior CT. 4. Nonvisualization of the left ovary.  No other adnexal mass.  Her main complaint is that of left lower quadrant pain.  She reports the pain is been present since even before placement of the Mirena IUD.  HPI: Patient initially presented with several weeks of a brownish vaginal discharge.  Ultimately, she underwent hysteroscopy and D&C on June 25, 2017.  Final pathology showed an adenocarcinoma of the endometrium grade 2.  Initially the patient had some postoperative bleeding but that has resolved.  She denies any other gynecologic history.  Gravida 2 para 2.  Patient has a number of medical comorbidities including a past  history of CVA in December 2004, hereditary spastic paraparesis, hypertension, gout, and some arthritic problems.  It was felt the patient was not a surgical candidate given her multiple comorbidities and therefore Mirena IUD was placed.  A CT scan was obtained on September 05, 2017 showed no evidence of metastatic disease in the peritoneal cavity or retroperitoneal lymph nodes.  There was a 4.1 x 3.4 cm ill-defined low-density lesion in the uterine fundus along with an intrauterine device.  Review of Systems:10 point review of systems is negative except as noted in interval history.   Vitals: Blood pressure (!) 126/55, pulse 82, temperature 98.4 F (36.9 C), temperature source Oral, resp. rate 18, height 5\' 2"  (1.575 m), weight 137 lb (62.1 kg), SpO2 98 %.  Physical Exam: General : The patient is a healthy woman in no acute distress.  HEENT: normocephalic, extraoccular movements normal; neck is supple without thyromegally  Lynphnodes: Supraclavicular and inguinal nodes not enlarged  Abdomen: Soft, non-tender, no ascites, no organomegally, no masses, no hernias  Pelvic:  EGBUS: Normal female  Vagina: Normal, no lesions, there is some blood in the vagina. Urethra and Bladder: Normal, non-tender  Cervix: Flush with the upper vagina.  IUD string is seen. Uterus: Anterior normal shape size and consistency Bi-manual examination: Non-tender; no adenxal masses or nodularity  Rectal: normal sphincter tone, no masses, no blood  Lower extremities: Are relatively rigid and the patient is unable to assume much aduction.   Allergies  Allergen Reactions  . Aleve [Naproxen Sodium] Itching and Swelling  . Bactrim [Sulfamethoxazole-Trimethoprim] Itching and Other (See Comments)  Had a bad reaction, can't remember, worked me over  . Motrin [Ibuprofen] Itching and Swelling  . Penicillins Hives, Itching and Other (See Comments)    Has patient had a PCN reaction causing immediate rash, facial/tongue/throat  swelling, SOB or lightheadedness with hypotension: No Has patient had a PCN reaction causing severe rash involving mucus membranes or skin necrosis: No Has patient had a PCN reaction that required hospitalization No Has patient had a PCN reaction occurring within the last 10 years: No If all of the above answers are "NO", then may proceed with Cephalosporin use.  Marland Kitchen Ultram [Tramadol] Other (See Comments)    Itvhing and tongue swelling.    Past Medical History:  Diagnosis Date  . Arthritis    all over  . Back pain   . Blood dyscrasia    on blood thiners  . Borderline diabetes   . Cerebrovascular disease   . Coronary artery disease    a. LHC (9/14):  pLM 30, oD1 50, D2 40-50, pRI 30, mCFX 30, mRCA 30, EF 65%  . GERD (gastroesophageal reflux disease)   . Gout   . Hemorrhoids   . High cholesterol   . History of kidney stones    60 years ago  . Hyperlipemia   . Hypertension   . Migraines   . Movement disorder   . 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   Marland Kitchen Peripheral vascular disease (HCC)    poor circulation lower legs  . Pneumonia    30 years ago  . Pre-diabetes    borderline  . Reflux   . Stroke [facility])    " 5 mini strokes"    Past Surgical History:  Procedure Laterality Date  . APPENDECTOMY    . CATARACT EXTRACTION W/ INTRAOCULAR LENS  IMPLANT, BILATERAL    . CHOLECYSTECTOMY    . COLONOSCOPY    . DILATATION & CURETTAGE/HYSTEROSCOPY WITH MYOSURE N/A 06/25/2017   Procedure: DILATATION & CURETTAGE/HYSTEROSCOPY;  Surgeon: Janyth Pupa, DO;  Location: [facility];  Service: Gynecology;  Laterality: N/A;  . DILATION AND CURETTAGE OF UTERUS    . ESOPHAGOGASTRODUODENOSCOPY N/A 11/14/2015   Procedure: ESOPHAGOGASTRODUODENOSCOPY (EGD);  Surgeon: Laurence Spates, MD;  Location: [facility] ENDOSCOPY;  Service: Endoscopy;  Laterality: N/A;  . HEMORRHOID SURGERY    . LEFT HEART CATHETERIZATION WITH CORONARY ANGIOGRAM N/A 03/25/2013   Procedure: LEFT HEART CATHETERIZATION WITH  CORONARY ANGIOGRAM;  Surgeon: Burnell Blanks, MD;  Location: [facility] CATH LAB;  Service: Cardiovascular;  Laterality: N/A;  . ROTATOR CUFF REPAIR    . TONSILLECTOMY    . TUBAL LIGATION      Current Outpatient Medications  Medication Sig Dispense Refill  . acetaminophen (TYLENOL) 500 MG tablet Take 500 mg by mouth every 6 (six) hours as needed for mild pain or moderate pain.    Marland Kitchen atorvastatin (LIPITOR) 40 MG tablet Take 40 mg by mouth daily.    . diclofenac sodium (VOLTAREN) 1 % GEL Apply 2 g topically 4 (four) times daily. Apply to knees, ankles, and hands, for arthritis     . dipyridamole-aspirin (AGGRENOX) 200-25 MG per 12 hr capsule Take 1 capsule by mouth daily.     . fluticasone (FLONASE) 50 MCG/ACT nasal spray Place 2 sprays into both nostrils daily.     Marland Kitchen lisinopril (PRINIVIL,ZESTRIL) 40 MG tablet Take 40 mg by mouth every morning.     Marland Kitchen omeprazole (PRILOSEC) 40 MG capsule Take 40 mg by mouth daily.     No current  facility-administered medications for this visit.     Social History   Socioeconomic History  . Marital status: Widowed    Spouse name: Not on file  . Number of children: 3  . Years of education: 10th  . Highest education level: Not on file  Occupational History  . Occupation: retired  Scientific laboratory technician  . Financial resource strain: Not on file  . Food insecurity:    Worry: Not on file    Inability: Not on file  . Transportation needs:    Medical: Not on file    Non-medical: Not on file  Tobacco Use  . Smoking status: Never Smoker  . Smokeless tobacco: Never Used  Substance and Sexual Activity  . Alcohol use: No  . Drug use: No  . Sexual activity: Never  Lifestyle  . Physical activity:    Days per week: Not on file    Minutes per session: Not on file  . Stress: Not on file  Relationships  . Social connections:    Talks on phone: Not on file    Gets together: Not on file    Attends religious service: Not on file    Active member of club or  organization: Not on file    Attends meetings of clubs or organizations: Not on file    Relationship status: Not on file  . Intimate partner violence:    Fear of current or ex partner: Not on file    Emotionally abused: Not on file    Physically abused: Not on file    Forced sexual activity: Not on file  Other Topics Concern  . Not on file  Social History Narrative   Patient lives alone, is a widow. Patient is retired. Patient has 10th grade education. Right handed.    Family History  Problem Relation Age of Onset  . Heart Problems Mother   . Heart Problems Father   . Heart attack Son       Marti Sleigh, MD 01/13/2018, 12:43 PM

## 2018-01-22 DIAGNOSIS — G459 Transient cerebral ischemic attack, unspecified: Secondary | ICD-10-CM | POA: Diagnosis not present

## 2018-01-22 DIAGNOSIS — R42 Dizziness and giddiness: Secondary | ICD-10-CM | POA: Diagnosis not present

## 2018-01-22 DIAGNOSIS — I10 Essential (primary) hypertension: Secondary | ICD-10-CM | POA: Diagnosis not present

## 2018-01-22 DIAGNOSIS — Z0000 Encounter for general adult medical examination without abnormal findings: Secondary | ICD-10-CM | POA: Diagnosis not present

## 2018-01-22 DIAGNOSIS — G893 Neoplasm related pain (acute) (chronic): Secondary | ICD-10-CM | POA: Diagnosis not present

## 2018-01-22 DIAGNOSIS — C541 Malignant neoplasm of endometrium: Secondary | ICD-10-CM | POA: Diagnosis not present

## 2018-01-24 ENCOUNTER — Observation Stay (HOSPITAL_COMMUNITY): Payer: Medicare Other

## 2018-01-24 ENCOUNTER — Other Ambulatory Visit: Payer: Self-pay

## 2018-01-24 ENCOUNTER — Emergency Department (HOSPITAL_COMMUNITY): Payer: Medicare Other

## 2018-01-24 ENCOUNTER — Encounter (HOSPITAL_COMMUNITY): Payer: Self-pay

## 2018-01-24 ENCOUNTER — Inpatient Hospital Stay (HOSPITAL_COMMUNITY)
Admission: EM | Admit: 2018-01-24 | Discharge: 2018-01-27 | DRG: 064 | Disposition: A | Discharge status: Rehabilitation Facility | Payer: Medicare Other | Attending: Internal Medicine | Admitting: Internal Medicine

## 2018-01-24 DIAGNOSIS — I472 Ventricular tachycardia: Secondary | ICD-10-CM | POA: Diagnosis not present

## 2018-01-24 DIAGNOSIS — I69319 Unspecified symptoms and signs involving cognitive functions following cerebral infarction: Secondary | ICD-10-CM

## 2018-01-24 DIAGNOSIS — R079 Chest pain, unspecified: Secondary | ICD-10-CM | POA: Diagnosis not present

## 2018-01-24 DIAGNOSIS — I679 Cerebrovascular disease, unspecified: Secondary | ICD-10-CM

## 2018-01-24 DIAGNOSIS — I6340 Cerebral infarction due to embolism of unspecified cerebral artery: Secondary | ICD-10-CM

## 2018-01-24 DIAGNOSIS — H538 Other visual disturbances: Secondary | ICD-10-CM

## 2018-01-24 DIAGNOSIS — Z86711 Personal history of pulmonary embolism: Secondary | ICD-10-CM

## 2018-01-24 DIAGNOSIS — Z8542 Personal history of malignant neoplasm of other parts of uterus: Secondary | ICD-10-CM

## 2018-01-24 DIAGNOSIS — I10 Essential (primary) hypertension: Secondary | ICD-10-CM | POA: Diagnosis present

## 2018-01-24 DIAGNOSIS — I2699 Other pulmonary embolism without acute cor pulmonale: Secondary | ICD-10-CM | POA: Diagnosis not present

## 2018-01-24 DIAGNOSIS — D62 Acute posthemorrhagic anemia: Secondary | ICD-10-CM

## 2018-01-24 DIAGNOSIS — I2510 Atherosclerotic heart disease of native coronary artery without angina pectoris: Secondary | ICD-10-CM | POA: Diagnosis present

## 2018-01-24 DIAGNOSIS — C801 Malignant (primary) neoplasm, unspecified: Secondary | ICD-10-CM | POA: Diagnosis present

## 2018-01-24 DIAGNOSIS — R7303 Prediabetes: Secondary | ICD-10-CM

## 2018-01-24 DIAGNOSIS — R42 Dizziness and giddiness: Secondary | ICD-10-CM | POA: Diagnosis not present

## 2018-01-24 DIAGNOSIS — Z7951 Long term (current) use of inhaled steroids: Secondary | ICD-10-CM

## 2018-01-24 DIAGNOSIS — D6859 Other primary thrombophilia: Secondary | ICD-10-CM | POA: Diagnosis not present

## 2018-01-24 DIAGNOSIS — C541 Malignant neoplasm of endometrium: Secondary | ICD-10-CM

## 2018-01-24 DIAGNOSIS — E785 Hyperlipidemia, unspecified: Secondary | ICD-10-CM | POA: Diagnosis present

## 2018-01-24 DIAGNOSIS — R531 Weakness: Secondary | ICD-10-CM | POA: Diagnosis not present

## 2018-01-24 DIAGNOSIS — E7800 Pure hypercholesterolemia, unspecified: Secondary | ICD-10-CM | POA: Diagnosis present

## 2018-01-24 DIAGNOSIS — Z791 Long term (current) use of non-steroidal anti-inflammatories (NSAID): Secondary | ICD-10-CM

## 2018-01-24 DIAGNOSIS — K219 Gastro-esophageal reflux disease without esophagitis: Secondary | ICD-10-CM | POA: Diagnosis present

## 2018-01-24 DIAGNOSIS — I739 Peripheral vascular disease, unspecified: Secondary | ICD-10-CM | POA: Diagnosis present

## 2018-01-24 DIAGNOSIS — H9190 Unspecified hearing loss, unspecified ear: Secondary | ICD-10-CM

## 2018-01-24 DIAGNOSIS — I69354 Hemiplegia and hemiparesis following cerebral infarction affecting left non-dominant side: Secondary | ICD-10-CM | POA: Diagnosis not present

## 2018-01-24 DIAGNOSIS — G114 Hereditary spastic paraplegia: Secondary | ICD-10-CM | POA: Diagnosis not present

## 2018-01-24 DIAGNOSIS — R778 Other specified abnormalities of plasma proteins: Secondary | ICD-10-CM

## 2018-01-24 DIAGNOSIS — R29701 NIHSS score 1: Secondary | ICD-10-CM | POA: Diagnosis present

## 2018-01-24 DIAGNOSIS — I639 Cerebral infarction, unspecified: Secondary | ICD-10-CM | POA: Diagnosis present

## 2018-01-24 DIAGNOSIS — R7989 Other specified abnormal findings of blood chemistry: Secondary | ICD-10-CM

## 2018-01-24 DIAGNOSIS — R0989 Other specified symptoms and signs involving the circulatory and respiratory systems: Secondary | ICD-10-CM | POA: Diagnosis not present

## 2018-01-24 DIAGNOSIS — Z79818 Long term (current) use of other agents affecting estrogen receptors and estrogen levels: Secondary | ICD-10-CM

## 2018-01-24 LAB — I-STAT TROPONIN, ED: Troponin i, poc: 0.18 ng/mL (ref 0.00–0.08)

## 2018-01-24 LAB — DIFFERENTIAL
ABS IMMATURE GRANULOCYTES: 0 10*3/uL (ref 0.0–0.1)
BASOS PCT: 1 %
Basophils Absolute: 0.1 10*3/uL (ref 0.0–0.1)
EOS ABS: 0.1 10*3/uL (ref 0.0–0.7)
Eosinophils Relative: 1 %
IMMATURE GRANULOCYTES: 1 %
Lymphocytes Relative: 15 %
Lymphs Abs: 1.1 10*3/uL (ref 0.7–4.0)
Monocytes Absolute: 0.6 10*3/uL (ref 0.1–1.0)
Monocytes Relative: 8 %
NEUTROS PCT: 74 %
Neutro Abs: 5.6 10*3/uL (ref 1.7–7.7)

## 2018-01-24 LAB — I-STAT CG4 LACTIC ACID, ED: LACTIC ACID, VENOUS: 1.84 mmol/L (ref 0.5–1.9)

## 2018-01-24 LAB — I-STAT CHEM 8, ED
BUN: 20 mg/dL (ref 8–23)
CALCIUM ION: 1.16 mmol/L (ref 1.15–1.40)
CHLORIDE: 110 mmol/L (ref 98–111)
Creatinine, Ser: 1 mg/dL (ref 0.44–1.00)
GLUCOSE: 161 mg/dL — AB (ref 70–99)
HCT: 32 % — ABNORMAL LOW (ref 36.0–46.0)
Hemoglobin: 10.9 g/dL — ABNORMAL LOW (ref 12.0–15.0)
Potassium: 3.9 mmol/L (ref 3.5–5.1)
Sodium: 139 mmol/L (ref 135–145)
TCO2: 17 mmol/L — AB (ref 22–32)

## 2018-01-24 LAB — COMPREHENSIVE METABOLIC PANEL
ALBUMIN: 3.6 g/dL (ref 3.5–5.0)
ALT: 15 U/L (ref 0–44)
AST: 20 U/L (ref 15–41)
Alkaline Phosphatase: 113 U/L (ref 38–126)
Anion gap: 10 (ref 5–15)
BUN: 21 mg/dL (ref 8–23)
CHLORIDE: 111 mmol/L (ref 98–111)
CO2: 17 mmol/L — ABNORMAL LOW (ref 22–32)
Calcium: 8.8 mg/dL — ABNORMAL LOW (ref 8.9–10.3)
Creatinine, Ser: 1.17 mg/dL — ABNORMAL HIGH (ref 0.44–1.00)
GFR calc Af Amer: 47 mL/min — ABNORMAL LOW (ref >60)
GFR calc non Af Amer: 40 mL/min — ABNORMAL LOW (ref >60)
GLUCOSE: 167 mg/dL — AB (ref 70–99)
POTASSIUM: 3.9 mmol/L (ref 3.5–5.1)
SODIUM: 138 mmol/L (ref 135–145)
Total Bilirubin: 0.5 mg/dL (ref 0.3–1.2)
Total Protein: 6 g/dL — ABNORMAL LOW (ref 6.5–8.1)

## 2018-01-24 LAB — TROPONIN I: Troponin I: 0.34 ng/mL (ref <0.03)

## 2018-01-24 LAB — CBC
HCT: 34.4 % — ABNORMAL LOW (ref 36.0–46.0)
Hemoglobin: 10.9 g/dL — ABNORMAL LOW (ref 12.0–15.0)
MCH: 30.4 pg (ref 26.0–34.0)
MCHC: 31.7 g/dL (ref 30.0–36.0)
MCV: 96.1 fL (ref 78.0–100.0)
PLATELETS: 179 10*3/uL (ref 150–400)
RBC: 3.58 MIL/uL — AB (ref 3.87–5.11)
RDW: 13.6 % (ref 11.5–15.5)
WBC: 7.6 10*3/uL (ref 4.0–10.5)

## 2018-01-24 LAB — PROTIME-INR
INR: 1.2
PROTHROMBIN TIME: 15.1 s (ref 11.4–15.2)

## 2018-01-24 LAB — APTT: aPTT: 28 seconds (ref 24–36)

## 2018-01-24 MED ORDER — ONDANSETRON 4 MG PO TBDP: 4.0000 mg | ORAL_TABLET | Freq: Three times a day (TID) | ORAL | Status: DC | PRN

## 2018-01-24 MED ORDER — MECLIZINE HCL 12.5 MG PO TABS
25.0000 mg | ORAL_TABLET | Freq: Two times a day (BID) | ORAL | Status: DC
  Administered 2018-01-24 – 2018-01-27 (×6): 25 mg via ORAL
  Filled 2018-01-24 (×6): qty 2

## 2018-01-24 MED ORDER — IOPAMIDOL (ISOVUE-370) INJECTION 76%
INTRAVENOUS | Status: AC
  Filled 2018-01-24: qty 100

## 2018-01-24 MED ORDER — ONDANSETRON HCL 4 MG PO TABS: 4.0000 mg | ORAL_TABLET | Freq: Four times a day (QID) | ORAL | Status: DC | PRN

## 2018-01-24 MED ORDER — FLUTICASONE PROPIONATE 50 MCG/ACT NA SUSP
2.0000 | Freq: Every day | NASAL | Status: DC
  Administered 2018-01-26 – 2018-01-27 (×2): 2 via NASAL
  Filled 2018-01-24: qty 16

## 2018-01-24 MED ORDER — IOPAMIDOL (ISOVUE-370) INJECTION 76%
100.0000 mL | Freq: Once | INTRAVENOUS | Status: AC | PRN
  Administered 2018-01-24: 100 mL via INTRAVENOUS

## 2018-01-24 MED ORDER — APIXABAN 5 MG PO TABS
10.0000 mg | ORAL_TABLET | Freq: Two times a day (BID) | ORAL | Status: DC
  Administered 2018-01-24 – 2018-01-27 (×6): 10 mg via ORAL
  Filled 2018-01-24 (×7): qty 2

## 2018-01-24 MED ORDER — ONDANSETRON HCL 4 MG/2ML IJ SOLN: 4.0000 mg | Freq: Four times a day (QID) | INTRAMUSCULAR | Status: DC | PRN

## 2018-01-24 MED ORDER — ACETAMINOPHEN-CODEINE #3 300-30 MG PO TABS
1.0000 | ORAL_TABLET | Freq: Three times a day (TID) | ORAL | Status: DC
  Administered 2018-01-24: 1 via ORAL
  Filled 2018-01-24 (×2): qty 1

## 2018-01-24 MED ORDER — STROKE: EARLY STAGES OF RECOVERY BOOK
Freq: Once | Status: AC
  Administered 2018-01-24: 19:00:00

## 2018-01-24 MED ORDER — AMLODIPINE BESYLATE 5 MG PO TABS: 2.5000 mg | ORAL_TABLET | Freq: Two times a day (BID) | ORAL | Status: DC | PRN

## 2018-01-24 MED ORDER — GADOBENATE DIMEGLUMINE 529 MG/ML IV SOLN
15.0000 mL | Freq: Once | INTRAVENOUS | Status: AC | PRN
  Administered 2018-01-24: 12 mL via INTRAVENOUS

## 2018-01-24 MED ORDER — ATORVASTATIN CALCIUM 40 MG PO TABS
40.0000 mg | ORAL_TABLET | Freq: Every evening | ORAL | Status: DC
  Administered 2018-01-24 – 2018-01-26 (×3): 40 mg via ORAL
  Filled 2018-01-24 (×3): qty 1

## 2018-01-24 MED ORDER — HEPARIN BOLUS VIA INFUSION
3500.0000 [IU] | Freq: Once | INTRAVENOUS | Status: DC
  Filled 2018-01-24: qty 3500

## 2018-01-24 MED ORDER — ACETAMINOPHEN 325 MG PO TABS: 650.0000 mg | ORAL_TABLET | Freq: Four times a day (QID) | ORAL | Status: DC | PRN

## 2018-01-24 MED ORDER — PANTOPRAZOLE SODIUM 40 MG PO TBEC
40.0000 mg | DELAYED_RELEASE_TABLET | Freq: Every day | ORAL | Status: DC
  Administered 2018-01-24 – 2018-01-27 (×4): 40 mg via ORAL
  Filled 2018-01-24 (×4): qty 1

## 2018-01-24 MED ORDER — POTASSIUM CHLORIDE IN NACL 20-0.9 MEQ/L-% IV SOLN
INTRAVENOUS | Status: DC
  Administered 2018-01-24: 19:00:00 via INTRAVENOUS
  Filled 2018-01-24 (×2): qty 1000

## 2018-01-24 MED ORDER — DOCUSATE SODIUM 100 MG PO CAPS
100.0000 mg | ORAL_CAPSULE | Freq: Two times a day (BID) | ORAL | Status: DC
  Administered 2018-01-24 – 2018-01-27 (×6): 100 mg via ORAL
  Filled 2018-01-24 (×6): qty 1

## 2018-01-24 MED ORDER — LISINOPRIL 20 MG PO TABS
40.0000 mg | ORAL_TABLET | Freq: Every morning | ORAL | Status: DC
  Administered 2018-01-25 – 2018-01-27 (×3): 40 mg via ORAL
  Filled 2018-01-24 (×3): qty 2

## 2018-01-24 MED ORDER — ACETAMINOPHEN 650 MG RE SUPP: 650.0000 mg | Freq: Four times a day (QID) | RECTAL | Status: DC | PRN

## 2018-01-24 MED ORDER — APIXABAN 5 MG PO TABS: 5.0000 mg | ORAL_TABLET | Freq: Two times a day (BID) | ORAL | Status: DC

## 2018-01-24 MED ORDER — SODIUM CHLORIDE 0.9% FLUSH
3.0000 mL | Freq: Two times a day (BID) | INTRAVENOUS | Status: DC
  Administered 2018-01-25 – 2018-01-27 (×6): 3 mL via INTRAVENOUS

## 2018-01-24 MED ORDER — OXYCODONE HCL 5 MG PO TABS
5.0000 mg | ORAL_TABLET | ORAL | Status: DC | PRN
  Administered 2018-01-25: 5 mg via ORAL
  Filled 2018-01-24: qty 1

## 2018-01-24 MED ORDER — DICLOFENAC SODIUM 1 % TD GEL
2.0000 g | Freq: Four times a day (QID) | TRANSDERMAL | Status: DC
  Administered 2018-01-24 – 2018-01-27 (×6): 2 g via TOPICAL
  Filled 2018-01-24: qty 100

## 2018-01-24 MED ORDER — HEPARIN (PORCINE) IN NACL 100-0.45 UNIT/ML-% IJ SOLN
1050.0000 [IU]/h | INTRAMUSCULAR | Status: DC
  Filled 2018-01-24: qty 250

## 2018-01-24 NOTE — Progress Notes (Signed)
Pt back on floor from MRA. Test not completed d/t it being too soon since contrast was last given. MD consulted and MRA will be done after MN.

## 2018-01-24 NOTE — H&P (Signed)
History and Physical    Vicki Stein MLY:[id number] DOB: 11/24/1927 DOA: 01/24/2018  PCP: Briscoe Deutscher, MD  Patient coming from: Home  I have personally briefly reviewed patient's old medical records in [place]  Chief Complaint: And blurry vision  HPI: Vicki Stein is a 84 y.o. female with medical history significant of atrial adenocarcinoma, coronary artery disease, hypertension, peripheral vascular disease, stroke with residual cognitive deficits, and spastic congenital paraplegia who presents emergency department with 1 day history of blurry vision in both eyes and numbness that alternates between both arms, vertigo, and weakness.  She was seen by her primary care physician yesterday for the vertigo and was started on meclizine.  This gave her absolutely no relief her symptoms have remained the same.  Nothing makes them worse and nothing makes them better.  She was also started on Megace last week for her cancer.  It was suspected to be the cause of her neurological symptoms.  She continues to have a room spinning sensation and she says it is not positional it is associated with nausea but no vomiting.  She is generally weak but denies any focal weakness or difficulty with ambulating.  She has no chest pain no dyspnea no hemoptysis.  In the emergency department she was evaluated and a CT of her chest was obtained Which showed small pulmonary emboli on the segmental branches.  It was no heart strain noted.  She did have a slight troponin leak.  She is being admitted into the hospital for overnight observation given the abnormal troponin.  Is a concern that her EKG is different but I see no appreciable change.  Review of Systems: As per HPI otherwise all other systems reviewed and  negative.    Past Medical History:  Diagnosis Date  . Arthritis    all over  . Back pain   . Blood dyscrasia    on blood thiners  . Borderline diabetes   . Cerebrovascular disease   . Coronary  artery disease    a. LHC (9/14):  pLM 30, oD1 50, D2 40-50, pRI 30, mCFX 30, mRCA 30, EF 65%  . GERD (gastroesophageal reflux disease)   . Gout   . Hemorrhoids   . High cholesterol   . History of kidney stones    60 years ago  . Hyperlipemia   . Hypertension   . Migraines   . Movement disorder   . Paraplegia, spastic congenital ([place])    inherited .   Marland Kitchen Peripheral vascular disease (HCC)    poor circulation lower legs  . Pneumonia    30 years ago  . Pre-diabetes    borderline  . Reflux   . Stroke [facility])    " 5 mini strokes"    Past Surgical History:  Procedure Laterality Date  . APPENDECTOMY    . CATARACT EXTRACTION W/ INTRAOCULAR LENS  IMPLANT, BILATERAL    . CHOLECYSTECTOMY    . COLONOSCOPY    . DILATATION & CURETTAGE/HYSTEROSCOPY WITH MYOSURE N/A 06/25/2017   Procedure: DILATATION & CURETTAGE/HYSTEROSCOPY;  Surgeon: Janyth Pupa, DO;  Location: [facility];  Service: Gynecology;  Laterality: N/A;  . DILATION AND CURETTAGE OF UTERUS    . ESOPHAGOGASTRODUODENOSCOPY N/A 11/14/2015   Procedure: ESOPHAGOGASTRODUODENOSCOPY (EGD);  Surgeon: Laurence Spates, MD;  Location: [facility] ENDOSCOPY;  Service: Endoscopy;  Laterality: N/A;  . HEMORRHOID SURGERY    . LEFT HEART CATHETERIZATION WITH CORONARY ANGIOGRAM N/A 03/25/2013   Procedure: LEFT HEART CATHETERIZATION WITH CORONARY ANGIOGRAM;  Surgeon: Burnell Blanks, MD;  Location: [facility] CATH LAB;  Service: Cardiovascular;  Laterality: N/A;  . ROTATOR CUFF REPAIR    . TONSILLECTOMY    . TUBAL LIGATION      Social History   Social History Narrative   Patient lives alone, is a widow. Patient is retired. Patient has 10th grade education. Right handed.     reports that she has never smoked. She has never used smokeless tobacco. She reports that she does not drink alcohol or use drugs.  Allergies  Allergen Reactions  . Aleve [Naproxen Sodium] Itching and Swelling  . Bactrim [Sulfamethoxazole-Trimethoprim] Itching and Other (See Comments)      Had a bad reaction, can't remember, worked me over  . Motrin [Ibuprofen] Itching and Swelling  . Penicillins Hives, Itching and Other (See Comments)    Has patient had a PCN reaction causing immediate rash, facial/tongue/throat swelling, SOB or lightheadedness with hypotension: No Has patient had a PCN reaction causing severe rash involving mucus membranes or skin necrosis: No Has patient had a PCN reaction that required hospitalization No Has patient had a PCN reaction occurring within the last 10 years: No If all of the above answers are "NO", then may proceed with Cephalosporin use.  Marland Kitchen Ultram [Tramadol] Other (See Comments)    Itvhing and tongue swelling.    Family History  Problem Relation Age of Onset  . Heart Problems Mother   . Heart Problems Father   . Heart attack Son     Prior to Admission medications   Medication Sig Start Date End Date Taking? Authorizing Provider  acetaminophen (TYLENOL) 500 MG tablet Take 500 mg by mouth every 6 (six) hours as needed for mild pain or moderate pain.   Yes [provider]  acetaminophen-codeine (TYLENOL #3) 300-30 MG tablet Take 1 tablet by mouth every 8 (eight) hours.   Yes [provider]  amLODipine (NORVASC) 2.5 MG tablet Take 2.5 mg by mouth See admin instructions. Take 1 tablet by mouth twice daily as needed for a blood pressure 150-90 2 hours after taking lisinopril   Yes [provider]  atorvastatin (LIPITOR) 40 MG tablet Take 40 mg by mouth every evening.    Yes [provider]  diclofenac sodium (VOLTAREN) 1 % GEL Apply 2 g topically 4 (four) times daily. Apply to knees, ankles, and hands, for arthritis    Yes [provider]  diphenhydrAMINE (BENADRYL) 25 MG tablet Take 25 mg by mouth every 6 (six) hours as needed for allergies.   Yes [provider]  dipyridamole-aspirin (AGGRENOX) 200-25 MG per 12 hr capsule Take 1 capsule by mouth 2 (two) times daily.    Yes [provider]  fluticasone (FLONASE) 50 MCG/ACT nasal spray Place 2 sprays into both nostrils daily.  02/07/17  Yes [provider]  lisinopril (PRINIVIL,ZESTRIL) 40 MG tablet Take 40 mg by mouth every morning.    Yes [provider]  meclizine (ANTIVERT) 25 MG tablet Take 25 mg by mouth 2 (two) times daily.   Yes [provider]  megestrol (MEGACE) 40 MG tablet Take 1 tablet (40 mg total) by mouth 3 (three) times daily. 01/13/18  Yes Cross, Melissa D, NP  omeprazole (PRILOSEC) 40 MG capsule Take 40 mg by mouth at bedtime.    Yes [provider]  ondansetron (ZOFRAN-ODT) 4 MG disintegrating tablet Take 4 mg by mouth 3 (three) times daily as needed for nausea or vomiting.   Yes [provider]    Physical Exam:  Constitutional: NAD, calm, comfortable  Vitals:   01/24/18 1233  BP: 140/65  Pulse: 91  Resp: 16  Temp: 98.6 F (37 C)  TempSrc: Oral  SpO2: 99%   Eyes: PERRL, lids and conjunctivae normal ENMT: Mucous membranes are moist. Posterior pharynx clear of any exudate or lesions.Normal dentition.  Neck: normal, supple, no masses, no thyromegaly Respiratory: clear to auscultation bilaterally, no wheezing, no crackles. Normal respiratory effort. No accessory muscle use.  Cardiovascular: Regular rate and rhythm, no murmurs / rubs / gallops. No extremity edema. 2+ pedal pulses. No carotid bruits.  Abdomen: Lower quadrant tenderness unchanged from previous, no masses palpated. No hepatosplenomegaly. Bowel sounds positive.  Musculoskeletal: no clubbing / cyanosis. No joint deformity upper and lower extremities. Good ROM, no contractures. Normal muscle tone.  Skin: no rashes, lesions, ulcers. No induration Neurologic: CN 2-12 grossly intact. Sensation intact, DTR normal. Strength 5/5 in all 4.  Psychiatric: Normal judgment and insight. Alert and oriented x 3. Normal mood.     Labs on Admission: I have personally reviewed following labs and imaging  studies  CBC: Recent Labs  Lab 01/24/18 1240 01/24/18 1249  WBC 7.6  --   NEUTROABS 5.6  --   HGB 10.9* 10.9*  HCT 34.4* 32.0*  MCV 96.1  --   PLT 179  --    Basic Metabolic Panel: Recent Labs  Lab 01/24/18 1240 01/24/18 1249  NA 138 139  K 3.9 3.9  CL 111 110  CO2 17*  --   GLUCOSE 167* 161*  BUN 21 20  CREATININE 1.17* 1.00  CALCIUM 8.8*  --    Liver Function Tests: Recent Labs  Lab 01/24/18 1240  AST 20  ALT 15  ALKPHOS 113  BILITOT 0.5  PROT 6.0*  ALBUMIN 3.6   Coagulation Profile: Recent Labs  Lab 01/24/18 1240  INR 1.20   Troponin (Point of Care Test) Recent Labs    01/24/18 1247  TROPIPOC 0.18*    Urine analysis:    Component Value Date/Time   COLORURINE YELLOW 10/22/2015 0056   APPEARANCEUR CLEAR 10/22/2015 0056   LABSPEC 1.019 10/22/2015 0056   PHURINE 6.0 10/22/2015 0056   GLUCOSEU NEGATIVE 10/22/2015 0056   HGBUR TRACE (A) 10/22/2015 0056   BILIRUBINUR NEGATIVE 10/22/2015 0056   KETONESUR NEGATIVE 10/22/2015 0056   PROTEINUR NEGATIVE 10/22/2015 0056   UROBILINOGEN 1.0 03/13/2009 1447   NITRITE NEGATIVE 10/22/2015 0056   LEUKOCYTESUR NEGATIVE 10/22/2015 0056    Radiological Exams on Admission: Dg Chest 1 View  Result Date: 01/24/2018 CLINICAL DATA:  blurred vision. States started on new chemo medication 1 week ago, saw PCP on Friday and treated for vertigo. Pt reports numbness and weakness to bilateral arms intermittently. Pt reports no improvement to blurred vision despite treatment for vertigo, doesn't feel head "is clear." EXAM: CHEST  1 VIEW COMPARISON:  09/03/2015 FINDINGS: Lungs are clear. Heart size upper limits normal. Aortic Atherosclerosis (ICD10-170.0). No effusion. Vertebral endplate spurring at multiple levels in the mid and lower thoracic spine. IMPRESSION: No acute cardiopulmonary disease. Electronically Signed   By: Lucrezia Europe M.D.   On: 01/24/2018 13:48   Ct Head Wo Contrast  Result Date: 01/24/2018 CLINICAL DATA:   Blurred vision and vertigo 2 days. Intermittent bilateral arm numbness and weakness. EXAM: CT HEAD WITHOUT CONTRAST TECHNIQUE: Contiguous axial images were obtained from the base of the skull through the vertex without intravenous contrast. COMPARISON:  None. FINDINGS: Brain: Ventricles, cisterns and  other CSF spaces are normal. There is no mass, mass effect, shift of midline structures or acute hemorrhage. There is mild chronic ischemic microvascular disease. No evidence of acute infarction. Vascular: Significant calcified plaque over the right vertebral artery. Skull: Normal. Negative for fracture or focal lesion. Sinuses/Orbits: No acute finding. Other: None. IMPRESSION: No acute findings. Mild chronic ischemic microvascular disease. Electronically Signed   By: Marin Olp M.D.   On: 01/24/2018 13:31   Ct Angio Chest Pe W And/or Wo Contrast  Result Date: 01/24/2018 CLINICAL DATA:  Chest pain, blurred vision, vertigo history of endometrial adenocarcinoma EXAM: CT ANGIOGRAPHY CHEST WITH CONTRAST TECHNIQUE: Multidetector CT imaging of the chest was performed using the standard protocol during bolus administration of intravenous contrast. Multiplanar CT image reconstructions and MIPs were obtained to evaluate the vascular anatomy. CONTRAST:  130mL ISOVUE-370 IOPAMIDOL (ISOVUE-370) INJECTION 76% COMPARISON:  01/24/2018 chest x-ray FINDINGS: Cardiovascular: Pulmonary arteries are well visualized. Small hypodense filling defect within a right lower lobe medial segmental branch, images 168 through 180 consistent with a small right lower lobe pulmonary embolus. No significant saddle or central hilar embolus appreciated. RV to LV ratio is 0.86. Negative for heart strain. Aortic atherosclerosis noted. Negative for aneurysm. Native coronary atherosclerosis noted. Normal heart size. Small pericardial effusion present. Mediastinum/Nodes: No enlarged mediastinal, hilar, or axillary lymph nodes. Thyroid gland, trachea, and  esophagus demonstrate no significant findings. Lungs/Pleura: Minor basilar atelectasis. No significant focal pneumonia, collapse or consolidation. Negative for edema, effusion, or pneumothorax. Trachea central airways are patent. No pleural abnormality. Upper Abdomen: Remote cholecystectomy. Mild biliary prominence, suspect postoperative. Abdominal atherosclerosis. Degenerative changes of the spine. No acute upper abdominal finding. Musculoskeletal: Degenerative changes of the spine with large thoracic osteophytes throughout. No acute compression fracture. Bones are osteopenic. Sternum intact. Review of the MIP images confirms the above findings. IMPRESSION: Small right lower lobe segmental pulmonary embolus peripherally within a medial right lower lobe segmental artery. Very low thrombus burden. No significant central hilar or proximal pulmonary embolus. Negative for heart strain. Small pericardial effusion, nonspecific. Native coronary atherosclerosis Low lung volumes with bibasilar atelectasis. Aortic Atherosclerosis (ICD10-I70.0). Electronically Signed   By: Jerilynn Mages.  Shick M.D.   On: 01/24/2018 14:23    EKG: Independently reviewed.  Normal sinus rhythm with old anterior infarction interventricular conduction delay not previously noted (may be lead placement).  Assessment/Plan Active Problems:   Pulmonary embolism (HCC)   Endometrial adenocarcinoma (HCC)   Cerebrovascular disease in cancer patient ([place])   Hereditary spastic paraparesis (HCC)   Dyslipidemia   HTN (hypertension)   Coronary artery disease  1.  Small subsegmental pulmonary emboli: Very low clot burden: We will start Eliquis dosing appropriate for patient's age and renal function.  She is at risk for endometrial bleeding given her endometrial carcinoma.  This occurs would discontinue Eliquis as the clot burden is very small.  2.  Abnormal troponin: Likely enzyme leak related to small subsegmental pulmonary emboli.  In her age category with  history of coronary disease is patients at risk of having enzyme leaks.  Although the ER feels that the EKG is changed I do not see an appreciable change suggestive of ischemia.  We will cycle troponins and monitor patient on telemetry.  It is too risky to give her an aspirin in addition to the anticoagulation so we will just anticoagulate her for her pulmonary embolism.  3.  Endometrial adenocarcinoma: Recently her Mirena IUD was removed.  She also is on Megace which are both conjugated forms of  estrogens.  Patient likely sustained PE due to these medications which are necessary for the treatment of her endometrial carcinoma and are known risks.  4.  Cerebrovascular disease in a cancer patient: We will continue to monitor closely she is now starting on anticoagulation hopefully this will decrease her chances of an additional stroke..  5.  Hereditary spastic paraparesis: Continue outpatient medications and treatment.  6.  Dyslipidemia noted.  7.  Hypertension noted.  8.  Coronary artery disease doubt that this abnormal troponin is signs of a myocardial infarction.  Will monitor.  Do not think the patient would be a good candidate for catheterization.  DVT prophylaxis: Eliquis  code Status: Full code Family Communication: Husband and son who are present at the time of admission Disposition Plan: Return to home Consults called: None Admission status: Observation   Lady Deutscher MD [facility] Pager [phone]  If 7PM-7AM, please contact night-coverage [email or web address] Password [facility]  01/24/2018, 3:38 PM

## 2018-01-24 NOTE — ED Triage Notes (Signed)
Pt presents for evaluation of ongoing blurred vision. States started on new chemo medication 1 week ago, saw PCP on Friday and treated for vertigo. Pt reports numbness and weakness to bilateral arms intermittently. Pt reports no improvement to blurred vision despite treatment for vertigo, doesn't feel head "is clear."

## 2018-01-24 NOTE — Progress Notes (Signed)
Received from ER via stretcher; assisted to [facility] and then into bed; gait unsteady; patient oriented to room and unit routine; son is at the bedside.

## 2018-01-24 NOTE — Progress Notes (Signed)
Pt off floor to [facility].

## 2018-01-24 NOTE — ED Notes (Signed)
Pt returned from CT °

## 2018-01-24 NOTE — Progress Notes (Signed)
ANTICOAGULATION CONSULT NOTE - Initial Consult  Pharmacy Consult for heparin Indication: pulmonary embolus  Allergies  Allergen Reactions  . Aleve [Naproxen Sodium] Itching and Swelling  . Bactrim [Sulfamethoxazole-Trimethoprim] Itching and Other (See Comments)    Had a bad reaction, can't remember, worked me over  . Motrin [Ibuprofen] Itching and Swelling  . Penicillins Hives, Itching and Other (See Comments)    Has patient had a PCN reaction causing immediate rash, facial/tongue/throat swelling, SOB or lightheadedness with hypotension: No Has patient had a PCN reaction causing severe rash involving mucus membranes or skin necrosis: No Has patient had a PCN reaction that required hospitalization No Has patient had a PCN reaction occurring within the last 10 years: No If all of the above answers are "NO", then may proceed with Cephalosporin use.  Marland Kitchen Ultram [Tramadol] Other (See Comments)    Itvhing and tongue swelling.    Patient Measurements:   Heparin Dosing Weight: 62 kg  Vital Signs: Temp: 98.6 F (37 C) (07/20 1233) Temp Source: Oral (07/20 1233) BP: 140/65 (07/20 1233) Pulse Rate: 91 (07/20 1233)  Labs: Recent Labs    01/24/18 1240 01/24/18 1249  HGB 10.9* 10.9*  HCT 34.4* 32.0*  PLT 179  --   APTT 28  --   LABPROT 15.1  --   INR 1.20  --   CREATININE 1.17* 1.00    Estimated Creatinine Clearance: 33.7 mL/min (by C-G formula based on SCr of 1 mg/dL).  Assessment: CC/HPI: 84 yo f presenting with ongoing blurred vision - recently started on megace x 1 week  PMH: endometrial adenocarcinoma, CAD, HTN, PVD, CVA  Anticoag: none pta - iv hep for acute pe  Renal: SCr 1  Pulm: small right LL PE  Heme/Onc: H&H 10.9/32, Plt 179  Goal of Therapy:  Heparin level 0.3-0.7 units/ml Monitor platelets by anticoagulation protocol: Yes   Plan:  Heparin bolus 3500 units x 1 Heparin gtt 1050 units/hr Initial hl 0000 Daily HL CBC F/U long term AC plans  Levester Fresh, PharmD, BCPS, BCCCP Clinical Pharmacist [phone]  Please check AMION for all [facility] numbers  01/24/2018 3:19 PM

## 2018-01-24 NOTE — ED Provider Notes (Signed)
[place] EMERGENCY DEPARTMENT Provider Note   CSN: [id number] Arrival date & time: 01/24/18  1228   History   Chief Complaint Chief Complaint  Patient presents with  . Weakness  . Blurred Vision    HPI Vicki Stein is a 84 y.o. female with a medical history of endometrial adenocarcinoma, CAD, HTN, PVD, CVA with residual cognitive deficits, and spastic congenital paraplegia who presents to the ED with one day of blurred vision in both eyes, numbness that alternates between both arms, vertigo, and weakness. She was seen by her PCP yesterday who prescribed meclizine for vertigo symptoms, which has provided now relief. She was started on Megace for her cancer last week. This drug was suspected to be the cause of her neurological symptoms. The room spinning sensation she gets is not positional and is associated with nausea, but no vomiting. She feels generalized weakness, but denies weakness in one particular extremity. She denies chest pain, dyspnea, and hemoptysis.   Past Medical History:  Diagnosis Date  . Arthritis    all over  . Back pain   . Blood dyscrasia    on blood thiners  . Borderline diabetes   . Cerebrovascular disease   . Coronary artery disease    a. LHC (9/14):  pLM 30, oD1 50, D2 40-50, pRI 30, mCFX 30, mRCA 30, EF 65%  . GERD (gastroesophageal reflux disease)   . Gout   . Hemorrhoids   . High cholesterol   . History of kidney stones    60 years ago  . Hyperlipemia   . Hypertension   . Migraines   . Movement disorder   . Paraplegia, spastic congenital ([place])    inherited .   Marland Kitchen Peripheral vascular disease (HCC)    poor circulation lower legs  . Pneumonia    30 years ago  . Pre-diabetes    borderline  . Reflux   . Stroke [facility])    " 5 mini strokes"    Patient Active Problem List   Diagnosis Date Noted  . Chest pain 03/25/2013  . Dyslipidemia 03/25/2013  . HTN (hypertension) 03/25/2013  . Gout 03/25/2013  . Unstable angina  ([place]) 03/25/2013  . 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 Paraplegia, spastic congenital [facility])     Past Surgical History:  Procedure Laterality Date  . APPENDECTOMY    . CATARACT EXTRACTION W/ INTRAOCULAR LENS  IMPLANT, BILATERAL    . CHOLECYSTECTOMY    . COLONOSCOPY    . DILATATION & CURETTAGE/HYSTEROSCOPY WITH MYOSURE N/A 06/25/2017   Procedure: DILATATION & CURETTAGE/HYSTEROSCOPY;  Surgeon: Janyth Pupa, DO;  Location: [facility];  Service: Gynecology;  Laterality: N/A;  . DILATION AND CURETTAGE OF UTERUS    . ESOPHAGOGASTRODUODENOSCOPY N/A 11/14/2015   Procedure: ESOPHAGOGASTRODUODENOSCOPY (EGD);  Surgeon: Laurence Spates, MD;  Location: [facility] ENDOSCOPY;  Service: Endoscopy;  Laterality: N/A;  . HEMORRHOID SURGERY    . LEFT HEART CATHETERIZATION WITH CORONARY ANGIOGRAM N/A 03/25/2013   Procedure: LEFT HEART CATHETERIZATION WITH CORONARY ANGIOGRAM;  Surgeon: Burnell Blanks, MD;  Location: [facility] CATH LAB;  Service: Cardiovascular;  Laterality: N/A;  . ROTATOR CUFF REPAIR    . TONSILLECTOMY    . TUBAL LIGATION       OB History   None      Home Medications    Prior to Admission medications   Medication Sig Start Date End Date Taking? Authorizing Provider  acetaminophen (TYLENOL) 500 MG tablet Take 500 mg by mouth every 6 (six)  hours as needed for mild pain or moderate pain.   Yes [provider]  acetaminophen-codeine (TYLENOL #3) 300-30 MG tablet Take 1 tablet by mouth every 8 (eight) hours.   Yes [provider]  amLODipine (NORVASC) 2.5 MG tablet Take 2.5 mg by mouth See admin instructions. Take 1 tablet by mouth twice daily as needed for a blood pressure 150-90 2 hours after taking lisinopril   Yes [provider]  atorvastatin (LIPITOR) 40 MG tablet Take 40 mg by mouth every evening.    Yes [provider]  diclofenac sodium (VOLTAREN) 1 % GEL Apply 2 g topically 4 (four) times daily. Apply to knees, ankles, and hands, for  arthritis    Yes [provider]  diphenhydrAMINE (BENADRYL) 25 MG tablet Take 25 mg by mouth every 6 (six) hours as needed for allergies.   Yes [provider]  dipyridamole-aspirin (AGGRENOX) 200-25 MG per 12 hr capsule Take 1 capsule by mouth 2 (two) times daily.    Yes [provider]  fluticasone (FLONASE) 50 MCG/ACT nasal spray Place 2 sprays into both nostrils daily.  02/07/17  Yes [provider]  lisinopril (PRINIVIL,ZESTRIL) 40 MG tablet Take 40 mg by mouth every morning.    Yes [provider]  meclizine (ANTIVERT) 25 MG tablet Take 25 mg by mouth 2 (two) times daily.   Yes [provider]  megestrol (MEGACE) 40 MG tablet Take 1 tablet (40 mg total) by mouth 3 (three) times daily. 01/13/18  Yes Cross, Melissa D, NP  omeprazole (PRILOSEC) 40 MG capsule Take 40 mg by mouth at bedtime.    Yes [provider]  ondansetron (ZOFRAN-ODT) 4 MG disintegrating tablet Take 4 mg by mouth 3 (three) times daily as needed for nausea or vomiting.   Yes [provider]    Family History Family History  Problem Relation Age of Onset  . Heart Problems Mother   . Heart Problems Father   . Heart attack Son     Social History Social History   Tobacco Use  . Smoking status: Never Smoker  . Smokeless tobacco: Never Used  Substance Use Topics  . Alcohol use: No  . Drug use: No     Allergies   Aleve [naproxen sodium]; Bactrim [sulfamethoxazole-trimethoprim]; Motrin [ibuprofen]; Penicillins; and Ultram [tramadol]   Review of Systems Review of Systems  Constitutional: Negative for chills and fever.  HENT: Negative for rhinorrhea and sore throat.   Eyes: Positive for visual disturbance.  Respiratory: Negative for cough and shortness of breath.   Cardiovascular: Negative for chest pain and leg swelling.  Gastrointestinal: Positive for nausea. Negative for abdominal pain and vomiting.  Genitourinary: Negative for difficulty  urinating and dysuria.  Musculoskeletal: Negative for back pain.       Endorses chronic LLQ abdominal pain.  Skin: Negative for rash and wound.  Neurological: Positive for dizziness, weakness and numbness. Negative for syncope, speech difficulty and headaches.     Physical Exam Updated Vital Signs BP 140/65 (BP Location: Right Arm)   Pulse 91   Temp 98.6 F (37 C) (Oral)   Resp 16   SpO2 99%   Physical Exam  Constitutional: She is oriented to person, place, and time. She appears well-developed and well-nourished. No distress.  HENT:  Head: Normocephalic and atraumatic.  Eyes: Pupils are equal, round, and reactive to light. EOM are normal.  Neck: Normal range of motion. Neck supple.  Cardiovascular: Normal rate and regular rhythm. Exam reveals no  gallop and no friction rub.  No murmur heard. Pulmonary/Chest: Effort normal and breath sounds normal. No respiratory distress. She has no wheezes. She has no rales.  Abdominal: Soft. Bowel sounds are normal. She exhibits no distension.  LLQ TTP.  Musculoskeletal: Normal range of motion. She exhibits no edema or deformity.  Neurological: She is alert and oriented to person, place, and time. She displays normal reflexes. No cranial nerve deficit or sensory deficit. She exhibits normal muscle tone. Coordination normal.  Skin: Skin is warm and dry. Capillary refill takes less than 2 seconds. No rash noted.  Psychiatric: She has a normal mood and affect. Her behavior is normal.     ED Treatments / Results  Labs (all labs ordered are listed, but only abnormal results are displayed) Labs Reviewed  CBC - Abnormal; Notable for the following components:      Result Value   RBC 3.58 (*)    Hemoglobin 10.9 (*)    HCT 34.4 (*)    All other components within normal limits  COMPREHENSIVE METABOLIC PANEL - Abnormal; Notable for the following components:   CO2 17 (*)    Glucose, Bld 167 (*)    Creatinine, Ser 1.17 (*)    Calcium 8.8 (*)     Total Protein 6.0 (*)    GFR calc non Af Amer 40 (*)    GFR calc Af Amer 47 (*)    All other components within normal limits  I-STAT TROPONIN, ED - Abnormal; Notable for the following components:   Troponin i, poc 0.18 (*)    All other components within normal limits  I-STAT CHEM 8, ED - Abnormal; Notable for the following components:   Glucose, Bld 161 (*)    TCO2 17 (*)    Hemoglobin 10.9 (*)    HCT 32.0 (*)    All other components within normal limits  PROTIME-INR  APTT  DIFFERENTIAL  I-STAT CG4 LACTIC ACID, ED  CBG MONITORING, ED    EKG EKG Interpretation  Date/Time:  Saturday January 24 2018 12:32:23 EDT Ventricular Rate:  94 PR Interval:  182 QRS Duration: 110 QT Interval:  360 QTC Calculation: 450 R Axis:   -21 Text Interpretation:  Normal sinus rhythm Anterior infarct , age undetermined Abnormal ECG new ivcd Confirmed by Isla Pence [phone]) on 01/24/2018 1:25:33 PM   Radiology Dg Chest 1 View  Result Date: 01/24/2018 CLINICAL DATA:  blurred vision. States started on new chemo medication 1 week ago, saw PCP on Friday and treated for vertigo. Pt reports numbness and weakness to bilateral arms intermittently. Pt reports no improvement to blurred vision despite treatment for vertigo, doesn't feel head "is clear." EXAM: CHEST  1 VIEW COMPARISON:  09/03/2015 FINDINGS: Lungs are clear. Heart size upper limits normal. Aortic Atherosclerosis (ICD10-170.0). No effusion. Vertebral endplate spurring at multiple levels in the mid and lower thoracic spine. IMPRESSION: No acute cardiopulmonary disease. Electronically Signed   By: Lucrezia Europe M.D.   On: 01/24/2018 13:48   Ct Head Wo Contrast  Result Date: 01/24/2018 CLINICAL DATA:  Blurred vision and vertigo 2 days. Intermittent bilateral arm numbness and weakness. EXAM: CT HEAD WITHOUT CONTRAST TECHNIQUE: Contiguous axial images were obtained from the base of the skull through the vertex without intravenous contrast. COMPARISON:   None. FINDINGS: Brain: Ventricles, cisterns and other CSF spaces are normal. There is no mass, mass effect, shift of midline structures or acute hemorrhage. There is mild chronic ischemic microvascular disease. No evidence of acute infarction. Vascular:  Significant calcified plaque over the right vertebral artery. Skull: Normal. Negative for fracture or focal lesion. Sinuses/Orbits: No acute finding. Other: None. IMPRESSION: No acute findings. Mild chronic ischemic microvascular disease. Electronically Signed   By: Marin Olp M.D.   On: 01/24/2018 13:31   Ct Angio Chest Pe W And/or Wo Contrast  Result Date: 01/24/2018 CLINICAL DATA:  Chest pain, blurred vision, vertigo history of endometrial adenocarcinoma EXAM: CT ANGIOGRAPHY CHEST WITH CONTRAST TECHNIQUE: Multidetector CT imaging of the chest was performed using the standard protocol during bolus administration of intravenous contrast. Multiplanar CT image reconstructions and MIPs were obtained to evaluate the vascular anatomy. CONTRAST:  128mL ISOVUE-370 IOPAMIDOL (ISOVUE-370) INJECTION 76% COMPARISON:  01/24/2018 chest x-ray FINDINGS: Cardiovascular: Pulmonary arteries are well visualized. Small hypodense filling defect within a right lower lobe medial segmental branch, images 168 through 180 consistent with a small right lower lobe pulmonary embolus. No significant saddle or central hilar embolus appreciated. RV to LV ratio is 0.86. Negative for heart strain. Aortic atherosclerosis noted. Negative for aneurysm. Native coronary atherosclerosis noted. Normal heart size. Small pericardial effusion present. Mediastinum/Nodes: No enlarged mediastinal, hilar, or axillary lymph nodes. Thyroid gland, trachea, and esophagus demonstrate no significant findings. Lungs/Pleura: Minor basilar atelectasis. No significant focal pneumonia, collapse or consolidation. Negative for edema, effusion, or pneumothorax. Trachea central airways are patent. No pleural abnormality.  Upper Abdomen: Remote cholecystectomy. Mild biliary prominence, suspect postoperative. Abdominal atherosclerosis. Degenerative changes of the spine. No acute upper abdominal finding. Musculoskeletal: Degenerative changes of the spine with large thoracic osteophytes throughout. No acute compression fracture. Bones are osteopenic. Sternum intact. Review of the MIP images confirms the above findings. IMPRESSION: Small right lower lobe segmental pulmonary embolus peripherally within a medial right lower lobe segmental artery. Very low thrombus burden. No significant central hilar or proximal pulmonary embolus. Negative for heart strain. Small pericardial effusion, nonspecific. Native coronary atherosclerosis Low lung volumes with bibasilar atelectasis. Aortic Atherosclerosis (ICD10-I70.0). Electronically Signed   By: Jerilynn Mages.  Shick M.D.   On: 01/24/2018 14:23    Procedures Procedures (including critical care time)  Medications Ordered in ED Medications  iopamidol (ISOVUE-370) 76 % injection (has no administration in time range)  iopamidol (ISOVUE-370) 76 % injection 100 mL (100 mLs Intravenous Contrast Given 01/24/18 1354)     Initial Impression / Assessment and Plan / ED Course  I have reviewed the triage vital signs and the nursing notes.  Pertinent labs & imaging results that were available during my care of the patient were reviewed by me and considered in my medical decision making (see chart for details).  Vicki Stein is a 84 y.o. female with a medical history of endometrial adenocarcinoma, CAD, HTN, PVD, CVA with residual cognitive deficits, and spastic congenital paraplegia who presents to the ED with one day of blurred vision in both eyes, numbness that alternates between both arms, vertigo, and weakness in the setting of starting a new chemotherapy agent one week ago. Upon arrival to the ED, pt was afebrile and hemodynamically stable. Physical exam shows no focal neurological deficits or lower  extremity edema. Labs were significant for bicarb 17, lactate 1.84, and elevated troponin at 0.18. EKG showed new IVCD. CXR was negative. Head CT showed no acute intracranial abnormalities. The patient's vertigo is not consistent with BPPV, and given her history of cancer and stroke, there is concern for central process causing vertigo such as cerebellar stroke or mass. Will obtain brain MRI to evaluate. Elevated troponin in the absence of chest pain  is concerning for PE given her malignancy and sedentary lifestyle. Will obtain CTA PE study.  CTA showed a small right lower lobe segmental pulmonary embolus peripherally within a medial right lower lobe segmental artery. The CT was negative for heart strain, but EKG changes and troponinemia suggest heart strain. Pt warrants inpatient admission. Case discussed with hospitalist who advises against heparin and will determine anticoagulation regimen upon admission and f/u brain MR.  Final Clinical Impressions(s) / ED Diagnoses   Final diagnoses:  Generalized weakness  Blurred vision, bilateral  Pulmonary embolism without acute cor pulmonale, unspecified chronicity, unspecified pulmonary embolism type (HCC)  Elevated troponin    ED Discharge Orders    None       Dorrell, Andree Elk, MD 01/24/18 1619    Isla Pence, MD 01/26/18 1344

## 2018-01-25 ENCOUNTER — Observation Stay (HOSPITAL_COMMUNITY): Payer: Medicare Other

## 2018-01-25 ENCOUNTER — Other Ambulatory Visit: Payer: Self-pay

## 2018-01-25 DIAGNOSIS — R748 Abnormal levels of other serum enzymes: Secondary | ICD-10-CM | POA: Diagnosis not present

## 2018-01-25 DIAGNOSIS — R7989 Other specified abnormal findings of blood chemistry: Secondary | ICD-10-CM | POA: Diagnosis present

## 2018-01-25 DIAGNOSIS — I6310 Cerebral infarction due to embolism of unspecified precerebral artery: Secondary | ICD-10-CM

## 2018-01-25 DIAGNOSIS — I6389 Other cerebral infarction: Secondary | ICD-10-CM | POA: Diagnosis not present

## 2018-01-25 DIAGNOSIS — I6340 Cerebral infarction due to embolism of unspecified cerebral artery: Secondary | ICD-10-CM

## 2018-01-25 DIAGNOSIS — R531 Weakness: Secondary | ICD-10-CM | POA: Diagnosis not present

## 2018-01-25 DIAGNOSIS — I69351 Hemiplegia and hemiparesis following cerebral infarction affecting right dominant side: Secondary | ICD-10-CM | POA: Diagnosis not present

## 2018-01-25 DIAGNOSIS — H9190 Unspecified hearing loss, unspecified ear: Secondary | ICD-10-CM | POA: Diagnosis present

## 2018-01-25 DIAGNOSIS — D638 Anemia in other chronic diseases classified elsewhere: Secondary | ICD-10-CM | POA: Diagnosis present

## 2018-01-25 DIAGNOSIS — D62 Acute posthemorrhagic anemia: Secondary | ICD-10-CM | POA: Diagnosis not present

## 2018-01-25 DIAGNOSIS — I2699 Other pulmonary embolism without acute cor pulmonale: Secondary | ICD-10-CM | POA: Diagnosis not present

## 2018-01-25 DIAGNOSIS — I639 Cerebral infarction, unspecified: Secondary | ICD-10-CM | POA: Diagnosis not present

## 2018-01-25 DIAGNOSIS — Z8542 Personal history of malignant neoplasm of other parts of uterus: Secondary | ICD-10-CM | POA: Diagnosis not present

## 2018-01-25 DIAGNOSIS — E7800 Pure hypercholesterolemia, unspecified: Secondary | ICD-10-CM | POA: Diagnosis not present

## 2018-01-25 DIAGNOSIS — I739 Peripheral vascular disease, unspecified: Secondary | ICD-10-CM | POA: Diagnosis not present

## 2018-01-25 DIAGNOSIS — I63419 Cerebral infarction due to embolism of unspecified middle cerebral artery: Secondary | ICD-10-CM | POA: Diagnosis not present

## 2018-01-25 DIAGNOSIS — I10 Essential (primary) hypertension: Secondary | ICD-10-CM | POA: Diagnosis not present

## 2018-01-25 DIAGNOSIS — E46 Unspecified protein-calorie malnutrition: Secondary | ICD-10-CM | POA: Diagnosis not present

## 2018-01-25 DIAGNOSIS — I2510 Atherosclerotic heart disease of native coronary artery without angina pectoris: Secondary | ICD-10-CM | POA: Diagnosis not present

## 2018-01-25 DIAGNOSIS — R269 Unspecified abnormalities of gait and mobility: Secondary | ICD-10-CM | POA: Diagnosis not present

## 2018-01-25 DIAGNOSIS — R29701 NIHSS score 1: Secondary | ICD-10-CM | POA: Diagnosis present

## 2018-01-25 DIAGNOSIS — H538 Other visual disturbances: Secondary | ICD-10-CM | POA: Diagnosis not present

## 2018-01-25 DIAGNOSIS — I472 Ventricular tachycardia: Secondary | ICD-10-CM | POA: Diagnosis not present

## 2018-01-25 DIAGNOSIS — G894 Chronic pain syndrome: Secondary | ICD-10-CM | POA: Diagnosis present

## 2018-01-25 DIAGNOSIS — G114 Hereditary spastic paraplegia: Secondary | ICD-10-CM | POA: Diagnosis not present

## 2018-01-25 DIAGNOSIS — E785 Hyperlipidemia, unspecified: Secondary | ICD-10-CM | POA: Diagnosis not present

## 2018-01-25 DIAGNOSIS — Z9181 History of falling: Secondary | ICD-10-CM | POA: Diagnosis not present

## 2018-01-25 DIAGNOSIS — I69354 Hemiplegia and hemiparesis following cerebral infarction affecting left non-dominant side: Secondary | ICD-10-CM | POA: Diagnosis not present

## 2018-01-25 DIAGNOSIS — Z86711 Personal history of pulmonary embolism: Secondary | ICD-10-CM | POA: Diagnosis not present

## 2018-01-25 DIAGNOSIS — I69398 Other sequelae of cerebral infarction: Secondary | ICD-10-CM | POA: Diagnosis not present

## 2018-01-25 DIAGNOSIS — I63439 Cerebral infarction due to embolism of unspecified posterior cerebral artery: Secondary | ICD-10-CM | POA: Diagnosis not present

## 2018-01-25 DIAGNOSIS — R7303 Prediabetes: Secondary | ICD-10-CM | POA: Diagnosis not present

## 2018-01-25 DIAGNOSIS — Z79818 Long term (current) use of other agents affecting estrogen receptors and estrogen levels: Secondary | ICD-10-CM | POA: Diagnosis not present

## 2018-01-25 DIAGNOSIS — C541 Malignant neoplasm of endometrium: Secondary | ICD-10-CM | POA: Diagnosis not present

## 2018-01-25 DIAGNOSIS — D6859 Other primary thrombophilia: Secondary | ICD-10-CM | POA: Diagnosis not present

## 2018-01-25 DIAGNOSIS — R778 Other specified abnormalities of plasma proteins: Secondary | ICD-10-CM

## 2018-01-25 DIAGNOSIS — I6319 Cerebral infarction due to embolism of other precerebral artery: Secondary | ICD-10-CM | POA: Diagnosis not present

## 2018-01-25 DIAGNOSIS — K219 Gastro-esophageal reflux disease without esophagitis: Secondary | ICD-10-CM | POA: Diagnosis present

## 2018-01-25 LAB — LIPID PANEL
Cholesterol: 103 mg/dL (ref 0–200)
HDL: 33 mg/dL — ABNORMAL LOW
LDL Cholesterol: 55 mg/dL (ref 0–99)
Total CHOL/HDL Ratio: 3.1 ratio
Triglycerides: 74 mg/dL
VLDL: 15 mg/dL (ref 0–40)

## 2018-01-25 LAB — BASIC METABOLIC PANEL
ANION GAP: 8 (ref 5–15)
BUN: 20 mg/dL (ref 8–23)
CALCIUM: 8.7 mg/dL — AB (ref 8.9–10.3)
CO2: 19 mmol/L — ABNORMAL LOW (ref 22–32)
CREATININE: 1.04 mg/dL — AB (ref 0.44–1.00)
Chloride: 113 mmol/L — ABNORMAL HIGH (ref 98–111)
GFR, EST AFRICAN AMERICAN: 54 mL/min — AB (ref >60)
GFR, EST NON AFRICAN AMERICAN: 47 mL/min — AB (ref >60)
Glucose, Bld: 127 mg/dL — ABNORMAL HIGH (ref 70–99)
Potassium: 4.2 mmol/L (ref 3.5–5.1)
SODIUM: 140 mmol/L (ref 135–145)

## 2018-01-25 LAB — CBC
HEMATOCRIT: 33 % — AB (ref 36.0–46.0)
Hemoglobin: 10.5 g/dL — ABNORMAL LOW (ref 12.0–15.0)
MCH: 30.3 pg (ref 26.0–34.0)
MCHC: 31.8 g/dL (ref 30.0–36.0)
MCV: 95.1 fL (ref 78.0–100.0)
PLATELETS: 173 10*3/uL (ref 150–400)
RBC: 3.47 MIL/uL — ABNORMAL LOW (ref 3.87–5.11)
RDW: 14 % (ref 11.5–15.5)
WBC: 6.6 10*3/uL (ref 4.0–10.5)

## 2018-01-25 LAB — ECHOCARDIOGRAM COMPLETE

## 2018-01-25 LAB — HEMOGLOBIN A1C
Hgb A1c MFr Bld: 5.8 % — ABNORMAL HIGH (ref 4.8–5.6)
Mean Plasma Glucose: 119.76 mg/dL

## 2018-01-25 LAB — TROPONIN I
TROPONIN I: 0.44 ng/mL — AB (ref <0.03)
TROPONIN I: 0.52 ng/mL — AB (ref <0.03)

## 2018-01-25 LAB — GLUCOSE, CAPILLARY: Glucose-Capillary: 143 mg/dL — ABNORMAL HIGH (ref 70–99)

## 2018-01-25 MED ORDER — MEGESTROL ACETATE 40 MG PO TABS
40.0000 mg | ORAL_TABLET | Freq: Three times a day (TID) | ORAL | Status: DC
  Administered 2018-01-25 – 2018-01-27 (×7): 40 mg via ORAL
  Filled 2018-01-25 (×9): qty 1

## 2018-01-25 MED ORDER — ACETAMINOPHEN-CODEINE #3 300-30 MG PO TABS
1.0000 | ORAL_TABLET | Freq: Four times a day (QID) | ORAL | Status: DC | PRN
  Administered 2018-01-25 (×2): 1 via ORAL
  Filled 2018-01-25 (×2): qty 1

## 2018-01-25 MED ORDER — OXYCODONE HCL 5 MG PO TABS: 5.0000 mg | ORAL_TABLET | ORAL | Status: DC | PRN

## 2018-01-25 NOTE — Progress Notes (Signed)
  Echocardiogram 2D Echocardiogram has been performed.  Vicki Stein 01/25/2018, 8:06 AM

## 2018-01-25 NOTE — Progress Notes (Signed)
VASCULAR LAB PRELIMINARY  PRELIMINARY  PRELIMINARY  PRELIMINARY  Carotid duplex completed.    Preliminary report:  1-39% ICA plaquing. Vertebral artery flow is antegrade.   Geri Hepler, RVT 01/25/2018, 7:48 AM

## 2018-01-25 NOTE — Progress Notes (Signed)
OT Cancellation Note  Patient Details Name: Vicki Stein MRN: [id number] DOB: June 07, 1928   Cancelled Treatment:    Reason Eval/Treat Not Completed: Patient at procedure or test/ unavailable(ECHO).  [place], OTR/L Acute Rehab Pager: [phone] Office: [phone] 01/25/2018, 7:32 AM

## 2018-01-25 NOTE — Progress Notes (Signed)
Vicki Stein  AVW:[id number] DOB: 06-15-1928 DOA: 01/24/2018 PCP: Briscoe Deutscher, MD    Brief Narrative:  84 y.o. F w/ a hx of endometrial adenocarcinoma, CAD, HTN, PVD, stroke with residual cognitive deficits, and spastic congenital paraplegia who presented w/ 1 day of B blurry vision, numbness that alternates between both arms, vertigo, and weakness.  She was started on Megace the week prior for her cancer.   In the ED a CTa chest noted small pulmonary emboli incidentally.  After admit, and MRI of the brain noted acute CVAs.    Significant Events: 7/20 admit 7/21 carotid dopplers - 1-39% B ICA plaque 7/21 TTE   Subjective: No new complaints this morning.  Still reports some mild vertigo.  Denies cp, sob, n/v, or abdom pain. Has a good appetite.    Assessment & Plan:  Small subsegmental pulmonary emboli Very low clot burden - cont Eliquis   Multifocal cortical infarctions bilaterally + bilateral cerebellar infarctions suspicious for cardioembolic etiology - workup being directed by Neurology  Modestly elevated troponin likely enzyme leak related to small subsegmental pulmonary emboli - follow trend - no chest pain   Recent Labs  Lab 01/24/18 1750 01/25/18 0012 01/25/18 0636  TROPONINI 0.34* 0.44* 0.52*    Endometrial adenocarcinoma Recently her Mirena IUD was removed - is on Megace  Prior hx of Cerebrovascular disease Off aggrenox due to initiation of Eliquis  Hereditary spastic paraparesis  Dyslipidemia LDL 55  Hypertension  BP controlled   Mild non-obstructive Coronary artery disease  Cardiac cath 2014 showed 40-50% diagonal disease and 30% LM, 30% LCx and mid 30% RCA - medical management was suggested at that time   DVT prophylaxis: Eliquis  Code Status: FULL CODE Family Communication: spoke w/ family at bedside  Disposition Plan:   Consultants:  Neurology  Antimicrobials:  none  Objective: Blood pressure (!) 133/52, pulse 78, temperature 98.7  F (37.1 C), temperature source Oral, resp. rate 18, SpO2 98 %.  Intake/Output Summary (Last 24 hours) at 01/25/2018 0846 Last data filed at 01/24/2018 1853 Gross per 24 hour  Intake 240 ml  Output -  Net 240 ml   There were no vitals filed for this visit.  Examination: General: No acute respiratory distress Lungs: Clear to auscultation bilaterally without wheezes or crackles Cardiovascular: Regular rate and rhythm without murmur gallop or rub normal S1 and S2 Abdomen: Nontender, nondistended, soft, bowel sounds positive, no rebound, no ascites, no appreciable mass Extremities: No significant cyanosis, clubbing, or edema bilateral lower extremities  CBC: Recent Labs  Lab 01/24/18 1240 01/24/18 1249 01/25/18 0012  WBC 7.6  --  6.6  NEUTROABS 5.6  --   --   HGB 10.9* 10.9* 10.5*  HCT 34.4* 32.0* 33.0*  MCV 96.1  --  95.1  PLT 179  --  829   Basic Metabolic Panel: Recent Labs  Lab 01/24/18 1240 01/24/18 1249 01/25/18 0012  NA 138 139 140  K 3.9 3.9 4.2  CL 111 110 113*  CO2 17*  --  19*  GLUCOSE 167* 161* 127*  BUN 21 20 20   CREATININE 1.17* 1.00 1.04*  CALCIUM 8.8*  --  8.7*   GFR: Estimated Creatinine Clearance: 32.4 mL/min (A) (by C-G formula based on SCr of 1.04 mg/dL (H)).  Liver Function Tests: Recent Labs  Lab 01/24/18 1240  AST 20  ALT 15  ALKPHOS 113  BILITOT 0.5  PROT 6.0*  ALBUMIN 3.6    Coagulation Profile: Recent Labs  Lab 01/24/18  1240  INR 1.20    Cardiac Enzymes: Recent Labs  Lab 01/24/18 1750 01/25/18 0012 01/25/18 0636  TROPONINI 0.34* 0.44* 0.52*    HbA1C: Hgb A1c MFr Bld  Date/Time Value Ref Range Status  01/25/2018 12:12 AM 5.8 (H) 4.8 - 5.6 % Final    Comment:    (NOTE) Pre diabetes:          5.7%-6.4% Diabetes:              >6.4% Glycemic control for   <7.0% adults with diabetes   03/25/2013 07:01 PM 6.1 (H) <5.7 % Final    Comment:    (NOTE)                                                                        According to the ADA Clinical Practice Recommendations for 2011, when HbA1c is used as a screening test:  >=6.5%   Diagnostic of Diabetes Mellitus           (if abnormal result is confirmed) 5.7-6.4%   Increased risk of developing Diabetes Mellitus References:Diagnosis and Classification of Diabetes Mellitus,Diabetes HENI,7782,42(PNTIR 1):S62-S69 and Standards of Medical Care in         Diabetes - 2011,Diabetes Care,2011,34 (Suppl 1):S11-S61.    Scheduled Meds: . acetaminophen-codeine  1 tablet Oral Q8H  . apixaban  10 mg Oral BID   Followed by  . [START ON 01/31/2018] apixaban  5 mg Oral BID  . atorvastatin  40 mg Oral QPM  . diclofenac sodium  2 g Topical QID  . docusate sodium  100 mg Oral BID  . fluticasone  2 spray Each Nare Daily  . lisinopril  40 mg Oral q morning - 10a  . meclizine  25 mg Oral BID  . pantoprazole  40 mg Oral Daily  . sodium chloride flush  3 mL Intravenous Q12H     LOS: 0 days   Cherene Altes, MD [facility] Office  [phone] Pager - Text Page per Amion  If 7PM-7AM, please contact night-coverage per Amion 01/25/2018, 8:46 AM

## 2018-01-25 NOTE — Evaluation (Signed)
Occupational Therapy Evaluation Patient Details Name: Vicki Stein MRN: [id number] DOB: 19-Dec-1927 Today's Date: 01/25/2018    History of Present Illness 84 y.o. female who presented to the [facility] ED with blurred vision in conjunction with intermittent numbness and alternating weakness to bilateral upper extremities. PMH including CAD, GERD, HTN, paraplegia (spastic congenital), PVD, and "5 mini strokes".  MRI revealing small acute/subacute infarcts scattered throughout both cerebral and cerebellar hemispheres, possibly watershed ischemia.   Clinical Impression   PTA, pt was living alone and was performing ADLs and IADLs at home. Pt currently requiring Min A for grooming at sink and Min A +2 with RW for functional mobility. Pt presenting with decreased strength, balance, and safety awareness. Pt presenting with impulsivity and requiring cues throughout for safety. Pt highly motivated to return to PLOF. Pt family memory reporting that initial support can be provided at dc. Pt will require further acute OT to facilitate safe dc. Recommend dc to CIR for further OT to optimize safety, independence with ADLs, and return to PLOF.       Follow Up Recommendations  CIR;Supervision/Assistance - 24 hour    Equipment Recommendations  None recommended by OT    Recommendations for Other Services Rehab consult;PT consult     Precautions / Restrictions Precautions Precautions: Fall Restrictions Weight Bearing Restrictions: No      Mobility Bed Mobility Overal bed mobility: Needs Assistance Bed Mobility: Supine to Sit     Supine to sit: Min guard     General bed mobility comments: Min guard for initial sitting at EOB and safety due to extension and rigidity of BLEs (baseline spasticity). VC for trunk righting  Transfers Overall transfer level: Needs assistance Equipment used: Rolling walker (2 wheeled) Transfers: Sit to/from Stand Sit to Stand: Min assist;+2 physical assistance;+2  safety/equipment         General transfer comment: demonstrates impulsivity with transfer; does not allow time for self setup prior to transfer with resultant posterior lean requiring physical assist for safety/steadying    Balance Overall balance assessment: Needs assistance Sitting-balance support: No upper extremity supported;Feet supported Sitting balance-Leahy Scale: Good     Standing balance support: Bilateral upper extremity supported;During functional activity Standing balance-Leahy Scale: Poor Standing balance comment: reliant on external/RW for support                           ADL either performed or assessed with clinical judgement   ADL Overall ADL's : Needs assistance/impaired Eating/Feeding: Set up;Sitting   Grooming: Wash/dry hands;Minimal assistance;Standing Grooming Details (indicate cue type and reason): Min A for standing balance and safety at sink Upper Body Bathing: Sitting;Set up;Supervision/ safety   Lower Body Bathing: Minimal assistance;Sit to/from stand   Upper Body Dressing : Set up;Supervision/safety;Sitting   Lower Body Dressing: Minimal assistance;Sit to/from stand Lower Body Dressing Details (indicate cue type and reason): Pt donning socks by bringing ankles to knees demonstrating how she performs task at home. Pt however requiring preparatory stretching for knee flexion/extension before performing task. Toilet Transfer: Minimal assistance;Cueing for safety;Ambulation(Simulated to recliner) Armed forces technical officer Details (indicate cue type and reason): Min A for safety and requiring cues not to sit prematurely         Functional mobility during ADLs: Minimal assistance;+2 for safety/equipment;Rolling walker;Cueing for safety General ADL Comments: Pt presenting with decreased strength, balance, safety awareness, and vision. Pt highly motivated to return home and to PLOF.      Vision Baseline Vision/History: Wears  glasses;Cataracts(Cataract sx with lens placement) Wears Glasses: At all times Patient Visual Report: No change from baseline(Blurry vision upon admission but has resolved) Vision Assessment?: Yes Eye Alignment: Within Functional Limits Ocular Range of Motion: Within Functional Limits Alignment/Gaze Preference: Within Defined Limits Tracking/Visual Pursuits: Decreased smoothness of horizontal tracking;Decreased smoothness of vertical tracking;Other (comment)(Decreased holding outside of midline) Convergence: Impaired (comment) Additional Comments: Pt with decreased smooth tracking and convergence.     Perception     Praxis      Pertinent Vitals/Pain Pain Assessment: No/denies pain     Hand Dominance Right   Extremity/Trunk Assessment Upper Extremity Assessment Upper Extremity Assessment: Generalized weakness;RUE deficits/detail;LUE deficits/detail RUE Deficits / Details: Pt reporting numbness in right hand. Pt with generalized weakness for BUEs and decreased FM skills as seen during ADLs and testing.  RUE Sensation: (numbness) RUE Coordination: decreased fine motor;decreased gross motor LUE Deficits / Details: Pt with generalized weakness for BUEs and decreased FM skills as seen during ADLs and testing.  LUE Coordination: decreased fine motor;decreased gross motor   Lower Extremity Assessment Lower Extremity Assessment: Generalized weakness(Baseline deficit) RLE Deficits / Details: stiff/spastic LE even with mobility LLE Deficits / Details: reports L LE longer than R LE; stiff/spastic LE even with mobility   Cervical / Trunk Assessment Cervical / Trunk Assessment: Kyphotic   Communication Communication Communication: HOH   Cognition Arousal/Alertness: Awake/alert Behavior During Therapy: WFL for tasks assessed/performed;Impulsive Overall Cognitive Status: Within Functional Limits for tasks assessed                                 General Comments: highly  motivated to be independent. Noted impulsivity and decreased safety awareness. Pt stating "I don't want to go to the hospital unless I have to, because they are just going to stick you in a home."   General Comments  Daughter in law present throughout session    Exercises     Shoulder Instructions      Home Living Family/patient expects to be discharged to:: Private residence Living Arrangements: Alone Available Help at Discharge: Family;Available PRN/intermittently Type of Home: House Home Access: Stairs to enter [employer] of Steps: 4 Entrance Stairs-Rails: Right Home Layout: One level     Bathroom Shower/Tub: Occupational psychologist: Standard     Home Equipment: Environmental consultant - 2 wheels;Shower seat;Adaptive equipment;Bedside commode(3 wheeled walker)   Additional Comments: has walker outside shower to assist with steadying      Prior Functioning/Environment Level of Independence: Independent with assistive device(s)        Comments: Pt performs all ADLs and IADLs in home. Uses AE for LB dressing. uses RW all the time; neighbor does grocery shopping and driving        OT Problem List: Decreased strength;Decreased range of motion;Decreased activity tolerance;Impaired balance (sitting and/or standing);Decreased safety awareness;Decreased knowledge of use of DME or AE;Decreased knowledge of precautions;Impaired vision/perception;Decreased coordination;Impaired sensation;Impaired UE functional use      OT Treatment/Interventions: Self-care/ADL training;Therapeutic exercise;Energy conservation;DME and/or AE instruction;Therapeutic activities;Patient/family education    OT Goals(Current goals can be found in the care plan section) Acute Rehab OT Goals Patient Stated Goal: "Go home and return to my life." OT Goal Formulation: With patient Time For Goal Achievement: 02/08/18 Potential to Achieve Goals: Good  OT Frequency: Min 3X/week   Barriers to D/C:             Co-evaluation PT/OT/SLP Co-Evaluation/Treatment: Yes Reason  for Co-Treatment: For patient/therapist safety;To address functional/ADL transfers PT goals addressed during session: Mobility/safety with mobility OT goals addressed during session: ADL's and self-care      AM-PAC PT "6 Clicks" Daily Activity     Outcome Measure Help from another person eating meals?: None Help from another person taking care of personal grooming?: A Little Help from another person toileting, which includes using toliet, bedpan, or urinal?: A Little Help from another person bathing (including washing, rinsing, drying)?: A Little Help from another person to put on and taking off regular upper body clothing?: A Little Help from another person to put on and taking off regular lower body clothing?: A Little 6 Click Score: 19   End of Session Equipment Utilized During Treatment: Gait belt;Rolling walker Nurse Communication: Mobility status;Other (comment)(impulsivity)  Activity Tolerance: Patient tolerated treatment well Patient left: in chair;with call bell/phone within reach;with chair alarm set  OT Visit Diagnosis: Unsteadiness on feet (R26.81);Other abnormalities of gait and mobility (R26.89);Muscle weakness (generalized) (M62.81);History of falling (Z91.81)                Time: 3833-3832 OT Time Calculation (min): 23 min Charges:  OT General Charges $OT Visit: 1 Visit OT Evaluation $OT Eval Moderate Complexity: 1 Mod G-Codes:     [place] MSOT, OTR/L Acute Rehab Pager: [phone] Office: [place] 01/25/2018, 1:09 PM

## 2018-01-25 NOTE — Progress Notes (Signed)
STROKE TEAM PROGRESS NOTE   HISTORY OF PRESENT ILLNESS (per record) Vicki Stein is an 84 y.o. female who presented to the [facility] ED with blurred vision in conjunction with intermittent numbness and alternating weakness to bilateral upper extremities. Also with vertigo, which was recently successfully treated with meclizine. She also endorsed generalized weakness. Megace which was started for her cancer last week was initially suspected to be the etiology for her neurological symptoms. She was started on a new chemotherapy medication 1 week ago for her endometrial adenocarcinoma.   CT chest in the ED revealed small right lower lobe segmental pulmonary embolus peripherally within a medial right lower lobe segmental artery. Very low thrombus burden. No significant central hilar or proximal pulmonary embolus. Negative for heart strain.  She was started on apixaban for the PE. Her home Aggrenox has been discontinued.   MRI brain revealed multiple small foci of cortical/juxtacortical restricted diffusion within the cerebral hemispheres, as well as cerebellar foci of restricted diffusion.   PMHx includes CAD, CVA with residual cognitive deficits, congenital spastic paraplegia, PVD, HTN and borderline diabetes.      SUBJECTIVE (INTERVAL HISTORY) The patient's daughter-in-law was at the bedside.  The patient is very hard of hearing.  She has residual left-sided weakness from a previous stroke.  She denied chest pain or shortness of breath.  Apparently she had seen Dr. Brett Fairy in the past for her strokes. She sees Marti Sleigh MD for her endometrial adenocarcinoma.     OBJECTIVE Temp:  [97.6 F (36.4 C)-99 F (37.2 C)] 98.1 F (36.7 C) (07/21 1109) Pulse Rate:  [66-78] 69 (07/21 1109) Cardiac Rhythm: Ventricular tachycardia (07/21 0453) Resp:  [14-19] 16 (07/21 1109) BP: (106-148)/(47-98) 115/47 (07/21 1109) SpO2:  [96 %-100 %] 97 % (07/21 1109)  CBC:  Recent Labs  Lab  01/24/18 1240 01/24/18 1249 01/25/18 0012  WBC 7.6  --  6.6  NEUTROABS 5.6  --   --   HGB 10.9* 10.9* 10.5*  HCT 34.4* 32.0* 33.0*  MCV 96.1  --  95.1  PLT 179  --  761    Basic Metabolic Panel:  Recent Labs  Lab 01/24/18 1240 01/24/18 1249 01/25/18 0012  NA 138 139 140  K 3.9 3.9 4.2  CL 111 110 113*  CO2 17*  --  19*  GLUCOSE 167* 161* 127*  BUN 21 20 20   CREATININE 1.17* 1.00 1.04*  CALCIUM 8.8*  --  8.7*    Lipid Panel:     Component Value Date/Time   CHOL 103 01/25/2018 0012   TRIG 74 01/25/2018 0012   HDL 33 (L) 01/25/2018 0012   CHOLHDL 3.1 01/25/2018 0012   VLDL 15 01/25/2018 0012   LDLCALC 55 01/25/2018 0012   HgbA1c:  Lab Results  Component Value Date   HGBA1C 5.8 (H) 01/25/2018   Urine Drug Screen: No results found for: LABOPIA, COCAINSCRNUR, LABBENZ, AMPHETMU, THCU, LABBARB  Alcohol Level No results found for: [facility]  IMAGING   Dg Chest 1 View 01/24/2018 IMPRESSION:  No acute cardiopulmonary disease.    Ct Head Wo Contrast 01/24/2018 IMPRESSION:  No acute findings. Mild chronic ischemic microvascular disease.    Ct Angio Chest Pe W And/or Wo Contrast 01/24/2018 IMPRESSION:  Small right lower lobe segmental pulmonary embolus peripherally within a medial right lower lobe segmental artery. Very low thrombus burden. No significant central hilar or proximal pulmonary embolus. Negative for heart strain. Small pericardial effusion, nonspecific. Native coronary atherosclerosis Low lung volumes with bibasilar atelectasis.  Aortic Atherosclerosis (ICD10-I70.0).     Mr Jeri Cos And Wo Contrast 01/24/2018 IMPRESSION:  1. Small acute to early subacute infarcts scattered throughout both cerebral and cerebellar hemispheres, possibly watershed ischemia given distribution and relative symmetry. Query hypoperfusion event.  2. Mild-to-moderate chronic small vessel ischemic disease. Chronic cerebellar infarcts.     Mr Jodene Nam Head Wo  Contrast 01/25/2018 IMPRESSION:  Negative head MRA.     Trans Cranial Doppler Study With Bubbles - pending 01/25/2018     Transthoracic Echocardiogram  00/00/00 Study Conclusions - Left ventricle: The cavity size was normal. Systolic function was   normal. The estimated ejection fraction was in the range of 55%   to 60%. Wall motion was normal; there were no regional wall   motion abnormalities. Doppler parameters are consistent with   abnormal left ventricular relaxation (grade 1 diastolic   dysfunction). - Mitral valve: Mildly calcified annulus. There was mild   regurgitation. - Pulmonary arteries: Systolic pressure was mildly increased. PA   peak pressure: 32 mm Hg (S).    Bilateral Carotid Dopplers  01/25/2018 Preliminary report:  1-39% ICA plaquing. Vertebral artery flow is antegrade.     PHYSICAL EXAM Vitals:   01/25/18 0200 01/25/18 0400 01/25/18 0530 01/25/18 1109  BP: (!) 131/50 (!) 130/51 (!) 133/52 (!) 115/47  Pulse: 78 73 78 69  Resp: 18 18 18 16   Temp: 98.6 F (37 C) 98.8 F (37.1 C) 98.7 F (37.1 C) 98.1 F (36.7 C)  TempSrc: Oral Oral Oral Oral  SpO2: 98% 97% 98% 97%   Pleasant elderly Caucasian lady currently not in distress. . Afebrile. Head is nontraumatic. Neck is supple without bruit.    Cardiac exam no murmur or gallop. Lungs are clear to auscultation. Distal pulses are well felt.  Neurological Exam :  Awake alert oriented 3 with normal speech and language function. No aphasia or apraxia dysarthria. Extraocular moments are full range without nystagmus but there is dysmetric saccades bilaterally. Face is symmetric without weakness. Tongue midline. Motor system exam reveals symmetric bilateral upper extremity strength, tone and reflexes and coordination. Lower extremity bilateral paraparesis with proximal 3/5 hip flexion and 4/5 knee weakness. Weakness of ankle dorsiflexors bilaterally. Tone is increased in both lower extremities with spasticity.  Deep tendon reflexes are brisker in both lower extremities than in upper extremities. Sensation is intact bilaterally. Plantars about upgoing. Gait not tested.         ASSESSMENT/PLAN Ms. Vicki Stein is a 84 y.o. female with history of previous strokes, prediabetes, peripheral vascular disease, history of migraine headaches hypertension, hyperlipidemia, spastic congenital paraplegia, endometrial adenocarcinoma, coronary artery disease, and recent pulmonary emboli presenting with blurred vision, alternating weakness of both upper extremities, vertigo, and generalized weakness.  She did not receive IV t-PA due to late presentation and anticoagulation for pulmonary emboli   Strokes:  Small acute to early subacute infarcts scattered throughout both cerebral and cerebellar hemispheres - cardiacembolic - versus due to hypercoagulable state related to adenocarcinoma.  Resultant  Generalized weakness  CT head - No acute findings.  MRI head - Small acute to early subacute infarcts scattered throughout both cerebral and cerebellar hemispheres.  Chronic cerebellar infarcts.  MRA head - negative  TCD w bubbles - pending  Carotid Doppler - Preliminary report:  1-39% ICA plaquing. Vertebral artery flow is antegrade.   2D Echo - EF 55 to 60%.  No cardiac source of emboli identified.  LDL - 55  HgbA1c - 5.8  VTE prophylaxis -  Eliquis Diet Order           Diet Heart Room service appropriate? Yes; Fluid consistency: Thin  Diet effective now          dipyridamole SR 250 mg/aspirin 25 mg twice a day prior to admission, now on Eliquis (apixaban) daily  Patient counseled to be compliant with her antithrombotic medications  Ongoing aggressive stroke risk factor management  Therapy recommendations:  pending  Disposition:  Pending  Hypertension  Stable . Permissive hypertension (OK if < 220/120) but gradually normalize in 5-7 days . Long-term BP goal  normotensive  Hyperlipidemia  Lipid lowering medication PTA: Lipitor 40 mg daily  LDL 55, goal < 70  Current lipid lowering medication: Lipitor 40 mg daily  Continue statin at discharge  Pre - Diabetes  HgbA1c 5.8, goal < 7.0  Controlled  Other Stroke Risk Factors  Advanced age  Hx stroke/TIA  Coronary artery disease  Migraines  Hypercoagulable state secondary to malignancy  Other Active Problems  Endometrial adenocarcinoma  Multiple recent pulmonary emboli -> Eliquis  Anemia  Mildly elevated creatinine  Hearing impaired   Hospital day # 0  Mikey Bussing PA-C [facility] Pager [phone] 01/25/2018, 2:21 PM I have personally examined this patient, reviewed notes, independently viewed imaging studies, participated in medical decision making and plan of care.ROS completed by me personally and pertinent positives fully documented  I have made any additions or clarifications directly to the above note. Agree with note above.she has presented with generalized symptoms but MRI shows tiny by cerebral and cerebellar infarcts which may be related to hypercoagulable, adenocarcinoma versus marantic endocarditis. Recommend ongoing stroke workup and she likely needs long-term anticoagulation given recent pulmonary emboli. Hence TEE will not help.Check transcranial Doppler bubble study for PFO and right to left shunt Long discussion with patient and family at bedside and answered questions. Discussed with Dr. Minna Merritts than 50% time during this study family visit was spent on counseling and coordination of care about her bi-cerebral multiple strokes, relationship with cancer and discussion about evaluation and treatment plan  Antony Contras, MD Medical Director [place] Pager: [phone] 01/25/2018 3:09 PM   To contact Stroke Continuity provider, please refer to [email or web address] After hours, contact General Neurology

## 2018-01-25 NOTE — Evaluation (Signed)
Physical Therapy Evaluation Patient Details Name: Vicki Stein MRN: [id number] DOB: 1928/04/24 Today's Date: 01/25/2018   History of Present Illness  84 y.o. female who presented to the [facility] ED with blurred vision in conjunction with intermittent numbness and alternating weakness to bilateral upper extremities. PMH including CAD, GERD, HTN, paraplegia (spastic congenital), PVD, and "5 mini strokes".  MRI revealing small acute/subacute infarcts scattered throughout both cerebral and cerebellar hemispheres, possibly watershed ischemia.    Clinical Impression  Vicki Stein is a pleasant 84 y/o female admitted with the above listed diagnosis. Patient reporting that prior to admission she lived alone and was Mod I with mobility with AD. Today patient requiring Min A to min guard for transfers and mobility with poor safety awareness and impulsivity increasing her fall risk. Does have posterior lean with immediate standing balance requiring Min A for steadying. Feel as is patient would benefit from CIR at discharge to further progress safe functional mobility to allow for safe return to the home environment. PT to continue to follow acutely.    Follow Up Recommendations CIR;Supervision/Assistance - 24 hour    Equipment Recommendations  None recommended by PT    Recommendations for Other Services OT consult;Rehab consult     Precautions / Restrictions Precautions Precautions: Fall Restrictions Weight Bearing Restrictions: No      Mobility  Bed Mobility Overal bed mobility: Needs Assistance Bed Mobility: Supine to Sit     Supine to sit: Min guard     General bed mobility comments: Min guard for immedister sitting balance with VC for trunk righting  Transfers Overall transfer level: Needs assistance Equipment used: Rolling walker (2 wheeled) Transfers: Sit to/from Stand Sit to Stand: Min assist;+2 physical assistance;+2 safety/equipment         General transfer comment:  demonstrates impulsivity with transfer; does not allow time for self setup prior to transfer with resultant posterior lean requiring physical assist for safety/steadying  Ambulation/Gait Ambulation/Gait assistance: Min assist;Min guard Gait Distance (Feet): 50 Feet Assistive device: Rolling walker (2 wheeled) Gait Pattern/deviations: Step-to pattern;Decreased dorsiflexion - right;Decreased dorsiflexion - left;Decreased stride length;Trunk flexed Gait velocity: decreased   General Gait Details: stiff/rigid B LE with gait; poor hip/knee flexion with swing phase of gait; very impulsive require verbal cueing for safety  Stairs            Wheelchair Mobility    Modified Rankin (Stroke Patients Only) Modified Rankin (Stroke Patients Only) Pre-Morbid Rankin Score: Slight disability Modified Rankin: Moderately severe disability     Balance Overall balance assessment: Needs assistance Sitting-balance support: No upper extremity supported;Feet supported Sitting balance-Leahy Scale: Good     Standing balance support: Bilateral upper extremity supported;During functional activity Standing balance-Leahy Scale: Poor Standing balance comment: reliant on external/RW for support                             Pertinent Vitals/Pain Pain Assessment: No/denies pain    Home Living Family/patient expects to be discharged to:: Private residence Living Arrangements: Alone Available Help at Discharge: Family;Available PRN/intermittently Type of Home: House Home Access: Stairs to enter Entrance Stairs-Rails: Right Entrance Stairs-Number of Steps: 4 Home Layout: One level Home Equipment: Walker - 2 wheels;Shower seat(3 wheeled walker) Additional Comments: has walker outside shower to assist with steadying    Prior Function Level of Independence: Independent with assistive device(s)         Comments: uses RW all the time; neighbor does grocery shopping  Hand Dominance         Extremity/Trunk Assessment   Upper Extremity Assessment Upper Extremity Assessment: Defer to OT evaluation    Lower Extremity Assessment Lower Extremity Assessment: Generalized weakness;RLE deficits/detail;LLE deficits/detail RLE Deficits / Details: stiff/spastic LE even with mobility LLE Deficits / Details: reports L LE longer than R LE; stiff/spastic LE even with mobility    Cervical / Trunk Assessment Cervical / Trunk Assessment: Kyphotic  Communication   Communication: HOH(wears glasses all the time)  Cognition Arousal/Alertness: Awake/alert Behavior During Therapy: WFL for tasks assessed/performed;Impulsive Overall Cognitive Status: Within Functional Limits for tasks assessed                                 General Comments: highly motivated to be independent      General Comments      Exercises     Assessment/Plan    PT Assessment Patient needs continued PT services  PT Problem List Decreased strength;Decreased range of motion;Decreased activity tolerance;Decreased balance;Decreased mobility;Decreased coordination;Decreased knowledge of use of DME;Decreased safety awareness       PT Treatment Interventions DME instruction;Gait training;Functional mobility training;Stair training;Therapeutic activities;Therapeutic exercise;Balance training;Neuromuscular re-education;Patient/family education    PT Goals (Current goals can be found in the Care Plan section)  Acute Rehab PT Goals Patient Stated Goal: regain independence PT Goal Formulation: With patient Time For Goal Achievement: 02/08/18 Potential to Achieve Goals: Good    Frequency Min 4X/week   Barriers to discharge        Co-evaluation PT/OT/SLP Co-Evaluation/Treatment: Yes Reason for Co-Treatment: For patient/therapist safety;Necessary to address cognition/behavior during functional activity PT goals addressed during session: Mobility/safety with mobility         AM-PAC PT "6  Clicks" Daily Activity  Outcome Measure Difficulty turning over in bed (including adjusting bedclothes, sheets and blankets)?: A Little Difficulty moving from lying on back to sitting on the side of the bed? : A Little Difficulty sitting down on and standing up from a chair with arms (e.g., wheelchair, bedside commode, etc,.)?: Unable Help needed moving to and from a bed to chair (including a wheelchair)?: A Little Help needed walking in hospital room?: A Little Help needed climbing 3-5 steps with a railing? : A Lot 6 Click Score: 15    End of Session Equipment Utilized During Treatment: Gait belt Activity Tolerance: Patient tolerated treatment well Patient left: in chair;with call bell/phone within reach;with chair alarm set;with family/visitor present Nurse Communication: Mobility status PT Visit Diagnosis: Unsteadiness on feet (R26.81);Other abnormalities of gait and mobility (R26.89);Muscle weakness (generalized) (M62.81)    Time: 6433-2951 PT Time Calculation (min) (ACUTE ONLY): 23 min   Charges:   PT Evaluation $PT Eval Moderate Complexity: 1 Mod     PT G Codes:        Lanney Gins, PT, DPT 01/25/18 12:41 PM Pager: [phone]

## 2018-01-25 NOTE — Progress Notes (Signed)
Pt off floor to [facility].

## 2018-01-25 NOTE — Progress Notes (Signed)
Inpatient Rehabilitation  Per PT request, patient was screened by Kaio Kuhlman for appropriateness for an Inpatient Acute Rehab consult.  At this time we are recommending an Inpatient Rehab consult.  Text paged MD to notify.  Please order if you are agreeable.    Jarious Lyon, M.A., CCC/SLP Admission Coordinator  [facility]  Cell [phone]  

## 2018-01-25 NOTE — Consult Note (Addendum)
Referring Physician: Dr. Evangeline Gula    Chief Complaint: Blurred vision with numbness and weakness to bilateral upper extremities.  HPI: Vicki Stein is an 84 y.o. female who presented to the [facility] ED with blurred vision in conjunction with intermittent numbness and alternating weakness to bilateral upper extremities. Also with vertigo, which was recently successfully treated with meclizine. She also endorsed generalized weakness. Megace which was started for her cancer last week was initially suspected to be the etiology for her neurological symptoms. She was started on a new chemotherapy medication 1 week ago for her endometrial adenocarcinoma.   CT chest in the ED revealed small right lower lobe segmental pulmonary embolus peripherally within a medial right lower lobe segmental artery. Very low thrombus burden. No significant central hilar or proximal pulmonary embolus. Negative for heart strain.  She was started on apixaban for the PE. Her home Aggrenox has been discontinued.   MRI brain revealed multiple small foci of cortical/juxtacortical restricted diffusion within the cerebral hemispheres, as well as cerebellar foci of restricted diffusion.   PMHx includes CAD, CVA with residual cognitive deficits, congenital spastic paraplegia, PVD, HTN and borderline diabetes.   Past Medical History:  Diagnosis Date  . Arthritis    all over  . Back pain   . Blood dyscrasia    on blood thiners  . Borderline diabetes   . Cerebrovascular disease   . Coronary artery disease    a. LHC (9/14):  pLM 30, oD1 50, D2 40-50, pRI 30, mCFX 30, mRCA 30, EF 65%  . GERD (gastroesophageal reflux disease)   . Gout   . Hemorrhoids   . High cholesterol   . History of kidney stones    60 years ago  . Hyperlipemia   . Hypertension   . Migraines   . Movement disorder   . Paraplegia, spastic congenital ([place])    inherited .   Marland Kitchen Peripheral vascular disease (HCC)    poor circulation lower legs  . Pneumonia    30  years ago  . Pre-diabetes    borderline  . Reflux   . Stroke [facility])    " 5 mini strokes"    Past Surgical History:  Procedure Laterality Date  . APPENDECTOMY    . CATARACT EXTRACTION W/ INTRAOCULAR LENS  IMPLANT, BILATERAL    . CHOLECYSTECTOMY    . COLONOSCOPY    . DILATATION & CURETTAGE/HYSTEROSCOPY WITH MYOSURE N/A 06/25/2017   Procedure: DILATATION & CURETTAGE/HYSTEROSCOPY;  Surgeon: Janyth Pupa, DO;  Location: [facility];  Service: Gynecology;  Laterality: N/A;  . DILATION AND CURETTAGE OF UTERUS    . ESOPHAGOGASTRODUODENOSCOPY N/A 11/14/2015   Procedure: ESOPHAGOGASTRODUODENOSCOPY (EGD);  Surgeon: Laurence Spates, MD;  Location: [facility] ENDOSCOPY;  Service: Endoscopy;  Laterality: N/A;  . HEMORRHOID SURGERY    . LEFT HEART CATHETERIZATION WITH CORONARY ANGIOGRAM N/A 03/25/2013   Procedure: LEFT HEART CATHETERIZATION WITH CORONARY ANGIOGRAM;  Surgeon: Burnell Blanks, MD;  Location: [facility] CATH LAB;  Service: Cardiovascular;  Laterality: N/A;  . ROTATOR CUFF REPAIR    . TONSILLECTOMY    . TUBAL LIGATION      Family History  Problem Relation Age of Onset  . Heart Problems Mother   . Heart Problems Father   . Heart attack Son    Social History:  reports that she has never smoked. She has never used smokeless tobacco. She reports that she does not drink alcohol or use drugs.  Allergies:  Allergies  Allergen Reactions  . Aleve [Naproxen Sodium]  Itching and Swelling  . Bactrim [Sulfamethoxazole-Trimethoprim] Itching and Other (See Comments)    Had a bad reaction, can't remember, worked me over  . Motrin [Ibuprofen] Itching and Swelling  . Penicillins Hives, Itching and Other (See Comments)    Has patient had a PCN reaction causing immediate rash, facial/tongue/throat swelling, SOB or lightheadedness with hypotension: No Has patient had a PCN reaction causing severe rash involving mucus membranes or skin necrosis: No Has patient had a PCN reaction that required hospitalization  No Has patient had a PCN reaction occurring within the last 10 years: No If all of the above answers are "NO", then may proceed with Cephalosporin use.  Marland Kitchen Ultram [Tramadol] Other (See Comments)    Itvhing and tongue swelling.    Medications:  Prior to Admission:  Medications Prior to Admission  Medication Sig Dispense Refill Last Dose  . acetaminophen (TYLENOL) 500 MG tablet Take 500 mg by mouth every 6 (six) hours as needed for mild pain or moderate pain.   unk  . acetaminophen-codeine (TYLENOL #3) 300-30 MG tablet Take 1 tablet by mouth every 8 (eight) hours.   01/24/2018 at 0900  . amLODipine (NORVASC) 2.5 MG tablet Take 2.5 mg by mouth See admin instructions. Take 1 tablet by mouth twice daily as needed for a blood pressure 150-90 2 hours after taking lisinopril   unk  . atorvastatin (LIPITOR) 40 MG tablet Take 40 mg by mouth every evening.    01/23/2018 at Unknown time  . diclofenac sodium (VOLTAREN) 1 % GEL Apply 2 g topically 4 (four) times daily. Apply to knees, ankles, and hands, for arthritis    unk  . diphenhydrAMINE (BENADRYL) 25 MG tablet Take 25 mg by mouth every 6 (six) hours as needed for allergies.   Past Week at Unknown time  . dipyridamole-aspirin (AGGRENOX) 200-25 MG per 12 hr capsule Take 1 capsule by mouth 2 (two) times daily.    01/24/2018 at Unknown time  . fluticasone (FLONASE) 50 MCG/ACT nasal spray Place 2 sprays into both nostrils daily.    unk  . lisinopril (PRINIVIL,ZESTRIL) 40 MG tablet Take 40 mg by mouth every morning.    01/24/2018 at Unknown time  . meclizine (ANTIVERT) 25 MG tablet Take 25 mg by mouth 2 (two) times daily.   01/24/2018 at Unknown time  . megestrol (MEGACE) 40 MG tablet Take 1 tablet (40 mg total) by mouth 3 (three) times daily. 90 tablet 3 01/24/2018 at Unknown time  . omeprazole (PRILOSEC) 40 MG capsule Take 40 mg by mouth at bedtime.    01/23/2018 at Unknown time  . ondansetron (ZOFRAN-ODT) 4 MG disintegrating tablet Take 4 mg by mouth 3 (three)  times daily as needed for nausea or vomiting.   unk   Scheduled: . acetaminophen-codeine  1 tablet Oral Q8H  . apixaban  10 mg Oral BID   Followed by  . [START ON 01/31/2018] apixaban  5 mg Oral BID  . atorvastatin  40 mg Oral QPM  . diclofenac sodium  2 g Topical QID  . docusate sodium  100 mg Oral BID  . fluticasone  2 spray Each Nare Daily  . iopamidol      . lisinopril  40 mg Oral q morning - 10a  . meclizine  25 mg Oral BID  . pantoprazole  40 mg Oral Daily  . sodium chloride flush  3 mL Intravenous Q12H   Continuous: . 0.9 % NaCl with KCl 20 mEq / L 75 mL/hr at  01/24/18 1856    ROS: Negative for chest pain, dyspnea. Positive for nausea without vomiting. Negative for fever/chills, dysuria, speech changes, headaches or rash. Other ROS as per HPI.    Physical Examination: Blood pressure (!) 120/51, pulse 67, temperature 98.7 F (37.1 C), temperature source Oral, resp. rate 18, SpO2 99 %.  HEENT: [place]/AT Lungs: Respirations unlabored Ext: No edema  Neurologic Examination: Mental Status: Alert, fully oriented, thought content appropriate.  Speech fluent with intact comprehension, repetition and naming. Able to follow all commands without difficulty. Cranial Nerves: II:  Visual fields grossly intact with no extinction to DSS. PERRL.  III,IV, VI: EOMI with mildly saccadic quality to visual pursuits, no nystagmus V,VII: Smile symmetric, facial temp sensation equal bilaterally VIII: hearing intact to voice IX,X: no hypophonia XI: Symmetric shoulder shrug XII: midline tongue extension  Motor: Bilateral upper extremities 5/5 proximally and distally Bilateral lower extremities: 2-3/5 hip flexion, 4/5 knee extension and flexion, 4/5 ankle dorsi/plantar flexion. Increased, spastic tone to bilateral lower extremities. Decreased muscle bulk bilateral lower ext. (lower ext findings are chronic per patient)  Sensory: Temp and light touch intact throughout, bilaterally, without  extinction.  Deep Tendon Reflexes:  2+ bilateral brachioradialis and biceps 3+ patellae bilaterally Unable to elicit achilles reflexes Toes upgoing bilaterally Cerebellar: No ataxia with FNF bilaterally  Gait: Deferred  Results for orders placed or performed during the hospital encounter of 01/24/18 (from the past 48 hour(s))  Protime-INR     Status: None   Collection Time: 01/24/18 12:40 PM  Result Value Ref Range   Prothrombin Time 15.1 11.4 - 15.2 seconds   INR 1.20     Comment: Performed at [facility], [place] [address], [place], [place] [address]  APTT     Status: None   Collection Time: 01/24/18 12:40 PM  Result Value Ref Range   aPTT 28 24 - 36 seconds    Comment: Performed at [facility] [address], [place], [place] [address]  CBC     Status: Abnormal   Collection Time: 01/24/18 12:40 PM  Result Value Ref Range   WBC 7.6 4.0 - 10.5 K/uL   RBC 3.58 (L) 3.87 - 5.11 MIL/uL   Hemoglobin 10.9 (L) 12.0 - 15.0 g/dL   HCT 34.4 (L) 36.0 - 46.0 %   MCV 96.1 78.0 - 100.0 fL   MCH 30.4 26.0 - 34.0 pg   MCHC 31.7 30.0 - 36.0 g/dL   RDW 13.6 11.5 - 15.5 %   Platelets 179 150 - 400 K/uL    Comment: Performed at [facility], [place] [address], [place], [place] [address]  Differential     Status: None   Collection Time: 01/24/18 12:40 PM  Result Value Ref Range   Neutrophils Relative % 74 %   Neutro Abs 5.6 1.7 - 7.7 K/uL   Lymphocytes Relative 15 %   Lymphs Abs 1.1 0.7 - 4.0 K/uL   Monocytes Relative 8 %   Monocytes Absolute 0.6 0.1 - 1.0 K/uL   Eosinophils Relative 1 %   Eosinophils Absolute 0.1 0.0 - 0.7 K/uL   Basophils Relative 1 %   Basophils Absolute 0.1 0.0 - 0.1 K/uL   Immature Granulocytes 1 %   Abs Immature Granulocytes 0.0 0.0 - 0.1 K/uL    Comment: Performed at [facility] [address], [place],  [address]  Comprehensive metabolic panel     Status: Abnormal   Collection Time: 01/24/18 12:40 PM  Result Value Ref Range    Sodium 138 135 - 145 mmol/L   Potassium 3.9 3.5 - 5.1 mmol/L   Chloride 111 98 - 111 mmol/L    Comment: Please note change in reference range.   CO2 17 (L) 22 - 32 mmol/L   Glucose, Bld 167 (H) 70 - 99 mg/dL    Comment: Please note change in reference range.   BUN 21 8 - 23 mg/dL    Comment: Please note change in reference range.   Creatinine, Ser 1.17 (H) 0.44 - 1.00 mg/dL   Calcium 8.8 (L) 8.9 - 10.3 mg/dL   Total Protein 6.0 (L) 6.5 - 8.1 g/dL   Albumin 3.6 3.5 - 5.0 g/dL   AST 20 15 - 41 U/L   ALT 15 0 - 44 U/L    Comment: Please note change in reference range.   Alkaline Phosphatase 113 38 - 126 U/L   Total Bilirubin 0.5 0.3 - 1.2 mg/dL   GFR calc non Af Amer 40 (L) >60 mL/min   GFR calc Af Amer 47 (L) >60 mL/min    Comment: (NOTE) The eGFR has been calculated using the CKD EPI equation. This calculation has not been validated in all clinical situations. eGFR's persistently <60 mL/min signify possible Chronic Kidney Disease.    Anion gap 10 5 - 15    Comment: Performed at [facility] [address] [place], [place] [address]  I-stat troponin, ED     Status: Abnormal   Collection Time: 01/24/18 12:47 PM  Result Value Ref Range   Troponin i, poc 0.18 (HH) 0.00 - 0.08 ng/mL   Comment NOTIFIED PHYSICIAN    Comment 3            Comment: Due to the release kinetics of cTnI, a negative result within the first hours of the onset of symptoms does not rule out myocardial infarction with certainty. If myocardial infarction is still suspected, repeat the test at appropriate intervals.   I-Stat Chem 8, ED     Status: Abnormal   Collection Time: 01/24/18 12:49 PM  Result Value Ref Range   Sodium 139 135 - 145 mmol/L   Potassium 3.9 3.5 - 5.1 mmol/L   Chloride 110 98 - 111 mmol/L   BUN 20 8 - 23 mg/dL   Creatinine, Ser 1.00 0.44 - 1.00 mg/dL   Glucose, Bld 161 (H) 70 - 99 mg/dL   Calcium, Ion 1.16 1.15 - 1.40 mmol/L   TCO2 17 (L) 22 - 32 mmol/L   Hemoglobin 10.9 (L)  12.0 - 15.0 g/dL   HCT 32.0 (L) 36.0 - 46.0 %  I-Stat CG4 Lactic Acid, ED     Status: None   Collection Time: 01/24/18  1:51 PM  Result Value Ref Range   Lactic Acid, Venous 1.84 0.5 - 1.9 mmol/L  Troponin I     Status: Abnormal   Collection Time: 01/24/18  5:50 PM  Result Value Ref Range   Troponin I 0.34 (HH) <0.03 ng/mL    Comment: CRITICAL RESULT CALLED TO, READ BACK BY AND VERIFIED WITH: Nada Boozer RN AT 8527 01/24/18 BY Karie Chimera Performed at [facility], [address] [place], [place] [address]   Troponin I     Status: Abnormal   Collection Time: 01/25/18 12:12 AM  Result Value Ref Range   Troponin I 0.44 (HH) <0.03 ng/mL    Comment: CRITICAL VALUE NOTED.  VALUE IS CONSISTENT WITH PREVIOUSLY REPORTED AND CALLED  VALUE. Performed at [facility], [place] [address] [place], [place] [address]   CBC     Status: Abnormal   Collection Time: 01/25/18 12:12 AM  Result Value Ref Range   WBC 6.6 4.0 - 10.5 K/uL   RBC 3.47 (L) 3.87 - 5.11 MIL/uL   Hemoglobin 10.5 (L) 12.0 - 15.0 g/dL   HCT 33.0 (L) 36.0 - 46.0 %   MCV 95.1 78.0 - 100.0 fL   MCH 30.3 26.0 - 34.0 pg   MCHC 31.8 30.0 - 36.0 g/dL   RDW 14.0 11.5 - 15.5 %   Platelets 173 150 - 400 K/uL    Comment: Performed at [facility], [place] [address] [place], [place] [address]  Basic metabolic panel     Status: Abnormal   Collection Time: 01/25/18 12:12 AM  Result Value Ref Range   Sodium 140 135 - 145 mmol/L   Potassium 4.2 3.5 - 5.1 mmol/L   Chloride 113 (H) 98 - 111 mmol/L    Comment: Please note change in reference range.   CO2 19 (L) 22 - 32 mmol/L   Glucose, Bld 127 (H) 70 - 99 mg/dL    Comment: Please note change in reference range.   BUN 20 8 - 23 mg/dL    Comment: Please note change in reference range.   Creatinine, Ser 1.04 (H) 0.44 - 1.00 mg/dL   Calcium 8.7 (L) 8.9 - 10.3 mg/dL   GFR calc non Af Amer 47 (L) >60 mL/min   GFR calc Af Amer 54 (L) >60 mL/min    Comment: (NOTE) The eGFR has  been calculated using the CKD EPI equation. This calculation has not been validated in all clinical situations. eGFR's persistently <60 mL/min signify possible Chronic Kidney Disease.    Anion gap 8 5 - 15    Comment: Performed at [facility] [address] [place], [place] [address]  Hemoglobin A1c     Status: Abnormal   Collection Time: 01/25/18 12:12 AM  Result Value Ref Range   Hgb A1c MFr Bld 5.8 (H) 4.8 - 5.6 %    Comment: (NOTE) Pre diabetes:          5.7%-6.4% Diabetes:              >6.4% Glycemic control for   <7.0% adults with diabetes    Mean Plasma Glucose 119.76 mg/dL    Comment: Performed at [facility] [address] [place], [place] [address]  Lipid panel     Status: Abnormal   Collection Time: 01/25/18 12:12 AM  Result Value Ref Range   Cholesterol 103 0 - 200 mg/dL   Triglycerides 74 <150 mg/dL   HDL 33 (L) >40 mg/dL   Total CHOL/HDL Ratio 3.1 RATIO   VLDL 15 0 - 40 mg/dL   LDL Cholesterol 55 0 - 99 mg/dL    Comment:        Total Cholesterol/HDL:CHD Risk Coronary Heart Disease Risk Table                     Men   Women  1/2 Average Risk   3.4   3.3  Average Risk       5.0   4.4  2 X Average Risk   9.6   7.1  3 X Average Risk  23.4   11.0        Use the calculated Patient Ratio above and the CHD  Risk Table to determine the patient's CHD Risk.        ATP III CLASSIFICATION (LDL):  <100     mg/dL   Optimal  100-129  mg/dL   Near or Above                    Optimal  130-159  mg/dL   Borderline  160-189  mg/dL   High  >190     mg/dL   Very High Performed at [facility] [address], [place], [place] [address]    Dg Chest 1 View  Result Date: 01/24/2018 CLINICAL DATA:  blurred vision. States started on new chemo medication 1 week ago, saw PCP on Friday and treated for vertigo. Pt reports numbness and weakness to bilateral arms intermittently. Pt reports no improvement to blurred vision despite treatment for vertigo, doesn't  feel head "is clear." EXAM: CHEST  1 VIEW COMPARISON:  09/03/2015 FINDINGS: Lungs are clear. Heart size upper limits normal. Aortic Atherosclerosis (ICD10-170.0). No effusion. Vertebral endplate spurring at multiple levels in the mid and lower thoracic spine. IMPRESSION: No acute cardiopulmonary disease. Electronically Signed   By: Lucrezia Europe M.D.   On: 01/24/2018 13:48   Ct Head Wo Contrast  Result Date: 01/24/2018 CLINICAL DATA:  Blurred vision and vertigo 2 days. Intermittent bilateral arm numbness and weakness. EXAM: CT HEAD WITHOUT CONTRAST TECHNIQUE: Contiguous axial images were obtained from the base of the skull through the vertex without intravenous contrast. COMPARISON:  None. FINDINGS: Brain: Ventricles, cisterns and other CSF spaces are normal. There is no mass, mass effect, shift of midline structures or acute hemorrhage. There is mild chronic ischemic microvascular disease. No evidence of acute infarction. Vascular: Significant calcified plaque over the right vertebral artery. Skull: Normal. Negative for fracture or focal lesion. Sinuses/Orbits: No acute finding. Other: None. IMPRESSION: No acute findings. Mild chronic ischemic microvascular disease. Electronically Signed   By: Marin Olp M.D.   On: 01/24/2018 13:31   Ct Angio Chest Pe W And/or Wo Contrast  Result Date: 01/24/2018 CLINICAL DATA:  Chest pain, blurred vision, vertigo history of endometrial adenocarcinoma EXAM: CT ANGIOGRAPHY CHEST WITH CONTRAST TECHNIQUE: Multidetector CT imaging of the chest was performed using the standard protocol during bolus administration of intravenous contrast. Multiplanar CT image reconstructions and MIPs were obtained to evaluate the vascular anatomy. CONTRAST:  135m ISOVUE-370 IOPAMIDOL (ISOVUE-370) INJECTION 76% COMPARISON:  01/24/2018 chest x-ray FINDINGS: Cardiovascular: Pulmonary arteries are well visualized. Small hypodense filling defect within a right lower lobe medial segmental branch,  images 168 through 180 consistent with a small right lower lobe pulmonary embolus. No significant saddle or central hilar embolus appreciated. RV to LV ratio is 0.86. Negative for heart strain. Aortic atherosclerosis noted. Negative for aneurysm. Native coronary atherosclerosis noted. Normal heart size. Small pericardial effusion present. Mediastinum/Nodes: No enlarged mediastinal, hilar, or axillary lymph nodes. Thyroid gland, trachea, and esophagus demonstrate no significant findings. Lungs/Pleura: Minor basilar atelectasis. No significant focal pneumonia, collapse or consolidation. Negative for edema, effusion, or pneumothorax. Trachea central airways are patent. No pleural abnormality. Upper Abdomen: Remote cholecystectomy. Mild biliary prominence, suspect postoperative. Abdominal atherosclerosis. Degenerative changes of the spine. No acute upper abdominal finding. Musculoskeletal: Degenerative changes of the spine with large thoracic osteophytes throughout. No acute compression fracture. Bones are osteopenic. Sternum intact. Review of the MIP images confirms the above findings. IMPRESSION: Small right lower lobe segmental pulmonary embolus peripherally within a medial right lower lobe segmental artery. Very low thrombus burden.  No significant central hilar or proximal pulmonary embolus. Negative for heart strain. Small pericardial effusion, nonspecific. Native coronary atherosclerosis Low lung volumes with bibasilar atelectasis. Aortic Atherosclerosis (ICD10-I70.0). Electronically Signed   By: Jerilynn Mages.  Shick M.D.   On: 01/24/2018 14:23   Mr Brain W And Wo Contrast  Result Date: 01/24/2018 CLINICAL DATA:  Vertigo. Blurred vision. Intermittent weakness and numbness in both arms. History of endometrial cancer. EXAM: MRI HEAD WITHOUT AND WITH CONTRAST TECHNIQUE: Multiplanar, multiecho pulse sequences of the brain and surrounding structures were obtained without and with intravenous contrast. CONTRAST:  66m  MULTIHANCE GADOBENATE DIMEGLUMINE 529 MG/ML IV SOLN COMPARISON:  Head CT 01/24/2018 and MRI 06/28/2003. FINDINGS: Brain: There are numerous small foci of acute to early subacute infarction throughout both cerebral and cerebellar hemispheres in a largely symmetric fashion. There is cortical involvement in the frontal and parietal lobes bilaterally as well as some involvement of the centrum semiovale. There is milder involvement of both occipital lobes. Two small infarcts asymmetrically involve the right caudate and corona radiata. Some of the infarcts enhance. No intracranial hemorrhage, mass, midline shift, or extra-axial fluid collection is identified. There are small chronic infarcts bilaterally in the cerebellum and in the left caudate and left lateral periventricular white matter. Tiny chronic lacunar infarcts or perivascular spaces are noted in the pons. Patchy T2 hyperintensities in the cerebral white matter bilaterally are nonspecific but compatible with mild-to-moderate chronic small vessel ischemic disease, progressed from 2004. A cavum velum interpositum is incidentally noted, a normal variant. Vascular: Major intracranial vascular flow voids are preserved. Skull and upper cervical spine: Unremarkable bone marrow signal. Sinuses/Orbits: Bilateral cataract extraction. Paranasal sinuses and mastoid air cells are clear. Other: None. IMPRESSION: 1. Small acute to early subacute infarcts scattered throughout both cerebral and cerebellar hemispheres, possibly watershed ischemia given distribution and relative symmetry. Query hypoperfusion event. 2. Mild-to-moderate chronic small vessel ischemic disease. Chronic cerebellar infarcts. Electronically Signed   By: ALogan BoresM.D.   On: 01/24/2018 16:17    Assessment: 84y.o. female presenting with vertigo and PE. MRI revealed multifocal cortical infarctions bilaterally in addition to bilateral cerebellar infarctions, suspicious for cardioembolic etiology.  1.  Exam reveals no focal abnormality except for spastic paraparesis of the lower extremities, which is chronic 2. MRI brain reveals small/subcentimeter acute to early subacute infarcts scattered throughout both cerebral and cerebellar hemispheres. Per Radiology these are "possibly watershed ischemia given distribution and relative symmetry". On my review of the images the cerebral hemispheric strokes are cortical/juxtacortical in location, which is more consistent with cardioembolic strokes from a shower of small emboli.  3. Stroke Risk Factors - prior strokes visible on MRI, 5 symptomatic "ministrokes" in the past, HLD, CAD, HTN, hypercholesterolemia 4. Inherited congenital spastic paraplegia. Spastic paraparesis of the lower extremities is seen on exam.  5. Newly diagnosed PE. Now on Eliquis.  6. Started on a new chemotherapy medication 1 week ago for her endometrial adenocarcinoma. Given her cancer diagnosis, hypercoagulability is also high on the DDx regarding the etiology for her PE and acute multifocal strokes   Plan: 1. HgbA1c, fasting lipid panel 2. MRA of the brain without contrast 3. PT consult, OT consult, Speech consult 4. TTE with bubble study to assess for possible large PFO, as paradoxical embolization is on the DDx given newly diagnosed PE.  5. Carotid dopplers 6. Now on Eliquis for PE, which will also be of benefit regarding stroke prevention.   7. Risk factor modification 8. Telemetry monitoring 9. Frequent neuro checks  10. Continue atorvastatin 11. BP management.    '@Electronically'  signed: Dr. Kerney Elbe 01/25/2018, 1:34 AM

## 2018-01-26 ENCOUNTER — Inpatient Hospital Stay (HOSPITAL_COMMUNITY): Payer: Medicare Other

## 2018-01-26 DIAGNOSIS — I2699 Other pulmonary embolism without acute cor pulmonale: Secondary | ICD-10-CM

## 2018-01-26 DIAGNOSIS — H9190 Unspecified hearing loss, unspecified ear: Secondary | ICD-10-CM

## 2018-01-26 DIAGNOSIS — I63439 Cerebral infarction due to embolism of unspecified posterior cerebral artery: Secondary | ICD-10-CM

## 2018-01-26 DIAGNOSIS — I10 Essential (primary) hypertension: Secondary | ICD-10-CM

## 2018-01-26 DIAGNOSIS — D62 Acute posthemorrhagic anemia: Secondary | ICD-10-CM

## 2018-01-26 DIAGNOSIS — I63419 Cerebral infarction due to embolism of unspecified middle cerebral artery: Secondary | ICD-10-CM

## 2018-01-26 DIAGNOSIS — G114 Hereditary spastic paraplegia: Secondary | ICD-10-CM

## 2018-01-26 DIAGNOSIS — R7303 Prediabetes: Secondary | ICD-10-CM

## 2018-01-26 DIAGNOSIS — C541 Malignant neoplasm of endometrium: Secondary | ICD-10-CM

## 2018-01-26 LAB — CBC
HEMATOCRIT: 32.6 % — AB (ref 36.0–46.0)
Hemoglobin: 10.4 g/dL — ABNORMAL LOW (ref 12.0–15.0)
MCH: 30.5 pg (ref 26.0–34.0)
MCHC: 31.9 g/dL (ref 30.0–36.0)
MCV: 95.6 fL (ref 78.0–100.0)
Platelets: 165 10*3/uL (ref 150–400)
RBC: 3.41 MIL/uL — ABNORMAL LOW (ref 3.87–5.11)
RDW: 13.7 % (ref 11.5–15.5)
WBC: 6.1 10*3/uL (ref 4.0–10.5)

## 2018-01-26 LAB — COMPREHENSIVE METABOLIC PANEL
ALT: 14 U/L (ref 0–44)
AST: 23 U/L (ref 15–41)
Albumin: 2.9 g/dL — ABNORMAL LOW (ref 3.5–5.0)
Alkaline Phosphatase: 100 U/L (ref 38–126)
Anion gap: 7 (ref 5–15)
BUN: 20 mg/dL (ref 8–23)
CO2: 20 mmol/L — AB (ref 22–32)
Calcium: 8.6 mg/dL — ABNORMAL LOW (ref 8.9–10.3)
Chloride: 112 mmol/L — ABNORMAL HIGH (ref 98–111)
Creatinine, Ser: 0.99 mg/dL (ref 0.44–1.00)
GFR, EST AFRICAN AMERICAN: 57 mL/min — AB (ref >60)
GFR, EST NON AFRICAN AMERICAN: 49 mL/min — AB (ref >60)
Glucose, Bld: 100 mg/dL — ABNORMAL HIGH (ref 70–99)
POTASSIUM: 4.7 mmol/L (ref 3.5–5.1)
SODIUM: 139 mmol/L (ref 135–145)
Total Bilirubin: 0.4 mg/dL (ref 0.3–1.2)
Total Protein: 5.3 g/dL — ABNORMAL LOW (ref 6.5–8.1)

## 2018-01-26 LAB — GLUCOSE, CAPILLARY: GLUCOSE-CAPILLARY: 97 mg/dL (ref 70–99)

## 2018-01-26 LAB — TROPONIN I: TROPONIN I: 0.36 ng/mL — AB (ref <0.03)

## 2018-01-26 MED ORDER — ACETAMINOPHEN-CODEINE #3 300-30 MG PO TABS
1.0000 | ORAL_TABLET | ORAL | Status: DC | PRN
  Administered 2018-01-26: 1 via ORAL
  Administered 2018-01-27: 2 via ORAL
  Filled 2018-01-26: qty 2
  Filled 2018-01-26: qty 1

## 2018-01-26 NOTE — Evaluation (Signed)
Speech Language Pathology Evaluation Patient Details Name: Vicki Stein MRN: [id number] DOB: [birth year] Today's Date: 01/26/2018 Time: 7829-5621 SLP Time Calculation (min) (ACUTE ONLY): 14 min  Problem List:  Patient Active Problem List   Diagnosis Date Noted  . Cerebral embolism with cerebral infarction 01/25/2018  . CVA (cerebral vascular accident) ([place]) 01/25/2018  . Generalized weakness   . Blurred vision, bilateral   . Elevated troponin   . Endometrial adenocarcinoma ([place]) 01/24/2018  . Coronary artery disease 01/24/2018  . Cerebrovascular disease in cancer patient ([place]) 01/24/2018  . Pulmonary embolism ([place]) 01/24/2018  . Chest pain 03/25/2013  . Dyslipidemia 03/25/2013  . HTN (hypertension) 03/25/2013  . Gout 03/25/2013  . Unstable angina ([place]) 03/25/2013  . 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 Paraplegia, spastic congenital [facility])    Past Medical History:  Past Medical History:  Diagnosis Date  . Arthritis    all over  . Back pain   . Blood dyscrasia    on blood thiners  . Borderline diabetes   . Cerebrovascular disease   . Coronary artery disease    a. LHC (9/14):  pLM 30, oD1 50, D2 40-50, pRI 30, mCFX 30, mRCA 30, EF 65%  . GERD (gastroesophageal reflux disease)   . Gout   . Hemorrhoids   . High cholesterol   . History of kidney stones    60 years ago  . Hyperlipemia   . Hypertension   . Migraines   . Movement disorder   . 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   Marland Kitchen Peripheral vascular disease (H[facility])    poor circulation lower legs  . Pneumonia    30 years ago  . Pre-diabetes    borderline  . Reflux   . Stroke [facility])    " 5 mini strokes"   Past Surgical History:  Past Surgical History:  Procedure Laterality Date  . APPENDECTOMY    . CATARACT EXTRACTION W/ INTRAOCULAR LENS  IMPLANT, BILATERAL    . CHOLECYSTECTOMY    . COLONOSCOPY    . DILATATION & CURETTAGE/HYSTEROSCOPY WITH MYOSURE N/A 06/25/2017   Procedure:  DILATATION & CURETTAGE/HYSTEROSCOPY;  Surgeon: Janyth Pupa, DO;  Location: [place] [facility];  Service: Gynecology;  Laterality: N/A;  . DILATION AND CURETTAGE OF UTERUS    . ESOPHAGOGASTRODUODENOSCOPY N/A 11/14/2015   Procedure: ESOPHAGOGASTRODUODENOSCOPY (EGD);  Surgeon: Laurence Spates, MD;  Location: [facility] ENDOSCOPY;  Service: Endoscopy;  Laterality: N/A;  . HEMORRHOID SURGERY    . LEFT HEART CATHETERIZATION WITH CORONARY ANGIOGRAM N/A 03/25/2013   Procedure: LEFT HEART CATHETERIZATION WITH CORONARY ANGIOGRAM;  Surgeon: Burnell Blanks, MD;  Location: [facility] CATH LAB;  Service: Cardiovascular;  Laterality: N/A;  . ROTATOR CUFF REPAIR    . TONSILLECTOMY    . TUBAL LIGATION     HPI:  84 y.o. female who presented to the [facility] ED with blurred vision in conjunction with intermittent numbness and alternating weakness to bilateral upper extremities. PMH including CAD, GERD, HTN, paraplegia (spastic congenital), PVD, and documented CVA described by pt as "5 mini strokes". Documented residual cognitive deficits. MRI revealing small acute/subacute infarcts scattered throughout both cerebral and cerebellar hemispheres, possibly watershed ischemia.   Assessment / Plan / Recommendation Clinical Impression  Pt presents with baseline cognitive deficits that are further complicated by significant hearing impairment and impulsivity, all of which are considered baseline. Pt describes her impulsivity as being "spunky." To compensate for these baseline deficits, pt relies on hiring people to perform yardwork and housework as  well as a neighbor who takes her places and helps her cook. Currently her cognitive deficits are considered baseline but she is at increased risk of falling when these cognitive deficits are combined with balance deficits. Skilled ST is not indicated at this time.     SLP Assessment  SLP Recommendation/Assessment: Patient does not need any further Speech Lanaguage Pathology Services SLP Visit Diagnosis:  Cognitive communication deficit (R41.841);Attention and concentration deficit    Follow Up Recommendations  None    Frequency and Duration           SLP Evaluation Cognition  Overall Cognitive Status: History of cognitive impairments - at baseline Arousal/Alertness: Awake/alert Orientation Level: Oriented X4 Attention: Selective Selective Attention: Appears intact Memory: Appears intact Awareness: Impaired Awareness Impairment: Anticipatory impairment Problem Solving: Appears intact Behaviors: Impulsive;Restless(she describes this as "being spunky" at baseline) Safety/Judgment: Impaired       Comprehension  Auditory Comprehension Overall Auditory Comprehension: Impaired at baseline(Pt is HOH and this is largest contributor to auditory abilit) Yes/No Questions: Within Functional Limits Commands: Within Functional Limits Conversation: Simple Reading Comprehension Reading Status: Not tested    Expression Expression Primary Mode of Expression: Verbal Verbal Expression Overall Verbal Expression: Appears within functional limits for tasks assessed Initiation: No impairment Automatic Speech: Name;Social Response;Day of week;Month of year Written Expression Dominant Hand: Right Written Expression: Not tested   Oral / Motor  Oral Motor/Sensory Function Overall Oral Motor/Sensory Function: Within functional limits Motor Speech Overall Motor Speech: Appears within functional limits for tasks assessed Respiration: Within functional limits Phonation: Normal Resonance: Within functional limits Articulation: Within functional limitis Intelligibility: Intelligible Motor Planning: Witnin functional limits Motor Speech Errors: Not applicable   GO                    Eleftherios Dudenhoeffer 01/26/2018, 12:46 PM

## 2018-01-26 NOTE — Plan of Care (Signed)
  Problem: Health Behavior/Discharge Planning: Goal: Ability to manage health-related needs will improve Outcome: Progressing   Problem: Clinical Measurements: Goal: Ability to maintain clinical measurements within normal limits will improve Outcome: Progressing   

## 2018-01-26 NOTE — Progress Notes (Signed)
Vicki Stein  GXQ:[id number] DOB: 10/12/1927 DOA: 01/24/2018 PCP: Briscoe Deutscher, MD    Brief Narrative:  84 y.o. F w/ a hx of endometrial adenocarcinoma, CAD, HTN, PVD, prior stroke, and spastic congenital paraplegia who presented w/ 1 day of B blurry vision, numbness that alternates between both arms, vertigo, and generalized weakness.  She was started on Megace the week prior for her cancer.   In the ED a CTa chest noted small pulmonary emboli incidentally.  After admit, an MRI of the brain noted acute B small CVAs.    Significant Events: 7/20 admit 7/21 carotid dopplers - 1-39% B ICA plaque 7/21 TTE - EF 55-60% - grade 1 DD - no WMA - no evidence of vegetations  7/21 MRA head - negative   Subjective: Resting comfortably in a bedside chair.  Reports ongoing L LQ abdom/pelvic pain which is a chronic issue for her related to her endometrial CA.  Denies CP, N/V, sob, HA, F/C.    Assessment & Plan:  Small subsegmental pulmonary emboli Very low clot burden - cont Eliquis - given concomitant embolic CVA shower will need to remain on anticoag lifelong   Multifocal cortical infarctions bilaterally + bilateral cerebellar infarctions suspicious for cardioembolic etiology - workup being directed by Neurology - no indication for TEE as pt will require lifelong anticoag regardless - TCD pending   Modestly elevated troponin likely enzyme leak related to small subsegmental pulmonary emboli - recheck this AM to confirm trending down/normalizing - no chest pain   Recent Labs  Lab 01/24/18 1750 01/25/18 0012 01/25/18 0636  TROPONINI 0.34* 0.44* 0.52*    Endometrial adenocarcinoma Recently her Mirena IUD was removed - is on Megace - adjust pain meds and follow   Prior hx of Cerebrovascular disease Off aggrenox due to initiation of Eliquis  Hereditary spastic paraparesis  Dyslipidemia LDL 55  Hypertension  BP controlled   Mild non-obstructive Coronary artery disease  Cardiac  cath 2014 showed 40-50% diagonal disease and 30% LM, 30% LCx and mid 30% RCA - medical management was suggested at that time   DVT prophylaxis: Eliquis  Code Status: FULL CODE Family Communication: no family at bedside  Disposition Plan: awaiting CIR eval and TCD study   Consultants:  Neurology  Antimicrobials:  none  Objective: Blood pressure (!) 120/51, pulse 74, temperature 98.4 F (36.9 C), temperature source Oral, resp. rate 16, height 5\' 2"  (1.448 m), weight 62 kg (136 lb 11 oz), SpO2 100 %.  Intake/Output Summary (Last 24 hours) at 01/26/2018 0825 Last data filed at 01/25/2018 2024 Gross per 24 hour  Intake 480 ml  Output -  Net 480 ml   Filed Weights   01/25/18 1511  Weight: 62 kg (136 lb 11 oz)    Examination: General: No acute respiratory distress Lungs: CTA B - no wheezing or crackles  Cardiovascular: RRR - no M or rub  Abdomen: NT/ND, soft, bs+, no mass  Extremities: No significant C/C/E B LE   CBC: Recent Labs  Lab 01/24/18 1240 01/24/18 1249 01/25/18 0012 01/26/18 0355  WBC 7.6  --  6.6 6.1  NEUTROABS 5.6  --   --   --   HGB 10.9* 10.9* 10.5* 10.4*  HCT 34.4* 32.0* 33.0* 32.6*  MCV 96.1  --  95.1 95.6  PLT 179  --  173 185   Basic Metabolic Panel: Recent Labs  Lab 01/24/18 1240 01/24/18 1249 01/25/18 0012 01/26/18 0355  NA 138 139 140 139  K 3.9  3.9 4.2 4.7  CL 111 110 113* 112*  CO2 17*  --  19* 20*  GLUCOSE 167* 161* 127* 100*  BUN 21 20 20 20   CREATININE 1.17* 1.00 1.04* 0.99  CALCIUM 8.8*  --  8.7* 8.6*   GFR: Estimated Creatinine Clearance: 34 mL/min (by C-G formula based on SCr of 0.99 mg/dL).  Liver Function Tests: Recent Labs  Lab 01/24/18 1240 01/26/18 0355  AST 20 23  ALT 15 14  ALKPHOS 113 100  BILITOT 0.5 0.4  PROT 6.0* 5.3*  ALBUMIN 3.6 2.9*    Coagulation Profile: Recent Labs  Lab 01/24/18 1240  INR 1.20    Cardiac Enzymes: Recent Labs  Lab 01/24/18 1750 01/25/18 0012 01/25/18 0636  TROPONINI  0.34* 0.44* 0.52*    HbA1C: Hgb A1c MFr Bld  Date/Time Value Ref Range Status  01/25/2018 12:12 AM 5.8 (H) 4.8 - 5.6 % Final    Comment:    (NOTE) Pre diabetes:          5.7%-6.4% Diabetes:              >6.4% Glycemic control for   <7.0% adults with diabetes   03/25/2013 07:01 PM 6.1 (H) <5.7 % Final    Comment:    (NOTE)                                                                       According to the ADA Clinical Practice Recommendations for 2011, when HbA1c is used as a screening test:  >=6.5%   Diagnostic of Diabetes Mellitus           (if abnormal result is confirmed) 5.7-6.4%   Increased risk of developing Diabetes Mellitus References:Diagnosis and Classification of Diabetes Mellitus,Diabetes LFYB,0175,10(CHENI 1):S62-S69 and Standards of Medical Care in         Diabetes - 2011,Diabetes Care,2011,34 (Suppl 1):S11-S61.    Scheduled Meds: . apixaban  10 mg Oral BID   Followed by  . [START ON 01/31/2018] apixaban  5 mg Oral BID  . atorvastatin  40 mg Oral QPM  . diclofenac sodium  2 g Topical QID  . docusate sodium  100 mg Oral BID  . fluticasone  2 spray Each Nare Daily  . lisinopril  40 mg Oral q morning - 10a  . meclizine  25 mg Oral BID  . megestrol  40 mg Oral TID  . pantoprazole  40 mg Oral Daily  . sodium chloride flush  3 mL Intravenous Q12H     LOS: 1 day   Cherene Altes, MD [facility] Office  [phone] Pager - Text Page per Amion  If 7PM-7AM, please contact night-coverage per Amion 01/26/2018, 8:25 AM

## 2018-01-26 NOTE — Consult Note (Signed)
Physical Medicine and Rehabilitation Consult  Reason for Consult: Stroke with functional deficits.  Referring Physician: Dr. Leonie Man    HPI: Vicki Stein is a 84 y.o. female with history of hereditary spastic paraparesis, HTN, HOH, CVA with left sided weakness, endometrial CA (started on megace week PTA) who  was admitted on 01/25/18 with CP, blurred vision, dizziness as well as numbness and weakness BUE. History taken from chart review and patient. MRI brain reviewed, suggesting bilateral CVA. Per report, multiple small acute to early subacute infarcts throughout both cerebral and cerebellar hemispheres question watershed ischemia due to hypoperfusion.  CT chest revealed small RLL PE and she was started on Eliquis for treatment. 2D echo showed EF 55-60% with no wall abnormality, mildly calcified mitral valve and mildly increased PA pressures. Dr. Leonie Man felt that stroke likely due to cardioembolic v/s hypercoagulable state due to CA v/s marantic endocarditis. TCD ordered to rule out PFO.  Patient with weakness BUE with poor safety awareness, instability and impulsivity with high fall risk. CIR recommended due to functional decline.    Review of Systems  HENT: Positive for hearing loss.   Neurological: Positive for focal weakness and weakness.  All other systems reviewed and are negative.     Past Medical History:  Diagnosis Date  . Arthritis    all over  . Back pain   . Blood dyscrasia    on blood thiners  . Borderline diabetes   . Cerebrovascular disease   . Coronary artery disease    a. LHC (9/14):  pLM 30, oD1 50, D2 40-50, pRI 30, mCFX 30, mRCA 30, EF 65%  . GERD (gastroesophageal reflux disease)   . Gout   . Hemorrhoids   . High cholesterol   . History of kidney stones    60 years ago  . Hyperlipemia   . Hypertension   . Migraines   . Movement disorder   . 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   Marland Kitchen Peripheral vascular disease (HCC)    poor  circulation lower legs  . Pneumonia    30 years ago  . Pre-diabetes    borderline  . Reflux   . Stroke [facility])    " 5 mini strokes"    Past Surgical History:  Procedure Laterality Date  . APPENDECTOMY    . CATARACT EXTRACTION W/ INTRAOCULAR LENS  IMPLANT, BILATERAL    . CHOLECYSTECTOMY    . COLONOSCOPY    . DILATATION & CURETTAGE/HYSTEROSCOPY WITH MYOSURE N/A 06/25/2017   Procedure: DILATATION & CURETTAGE/HYSTEROSCOPY;  Surgeon: Janyth Pupa, DO;  Location: [facility];  Service: Gynecology;  Laterality: N/A;  . DILATION AND CURETTAGE OF UTERUS    . ESOPHAGOGASTRODUODENOSCOPY N/A 11/14/2015   Procedure: ESOPHAGOGASTRODUODENOSCOPY (EGD);  Surgeon: Laurence Spates, MD;  Location[facility] ENDOSCOPY;  Service: Endoscopy;  Laterality: N/A;  . HEMORRHOID SURGERY    . LEFT HEART CATHETERIZATION WITH CORONARY ANGIOGRAM N/A 03/25/2013   Procedure: LEFT HEART CATHETERIZATION WITH CORONARY ANGIOGRAM;  Surgeon: Burnell Blanks, MD;  Location: [facility] CATH LAB;  Service: Cardiovascular;  Laterality: N/A;  . ROTATOR CUFF REPAIR    . TONSILLECTOMY    . TUBAL LIGATION      Family History  Problem Relation Age of Onset  . Heart Problems Mother   . Heart Problems Father   . Heart attack Son     Social History:  Lives alone and independent with RW PTA. Per  reports that she has never smoked.  She has never used smokeless tobacco. She reports that she does not drink alcohol or use drugs.   Allergies  Allergen Reactions  . Aleve [Naproxen Sodium] Itching and Swelling  . Bactrim [Sulfamethoxazole-Trimethoprim] Itching and Other (See Comments)    Had a bad reaction, can't remember, worked me over  . Motrin [Ibuprofen] Itching and Swelling  . Penicillins Hives, Itching and Other (See Comments)    Has patient had a PCN reaction causing immediate rash, facial/tongue/throat swelling, SOB or lightheadedness with hypotension: No Has patient had a PCN reaction causing severe rash involving mucus membranes or skin  necrosis: No Has patient had a PCN reaction that required hospitalization No Has patient had a PCN reaction occurring within the last 10 years: No If all of the above answers are "NO", then may proceed with Cephalosporin use.  Marland Kitchen Ultram [Tramadol] Other (See Comments)    Itvhing and tongue swelling.    Medications Prior to Admission  Medication Sig Dispense Refill  . acetaminophen (TYLENOL) 500 MG tablet Take 500 mg by mouth every 6 (six) hours as needed for mild pain or moderate pain.    Marland Kitchen acetaminophen-codeine (TYLENOL #3) 300-30 MG tablet Take 1 tablet by mouth every 8 (eight) hours.    Marland Kitchen amLODipine (NORVASC) 2.5 MG tablet Take 2.5 mg by mouth See admin instructions. Take 1 tablet by mouth twice daily as needed for a blood pressure 150-90 2 hours after taking lisinopril    . atorvastatin (LIPITOR) 40 MG tablet Take 40 mg by mouth every evening.     . diclofenac sodium (VOLTAREN) 1 % GEL Apply 2 g topically 4 (four) times daily. Apply to knees, ankles, and hands, for arthritis     . diphenhydrAMINE (BENADRYL) 25 MG tablet Take 25 mg by mouth every 6 (six) hours as needed for allergies.    Marland Kitchen dipyridamole-aspirin (AGGRENOX) 200-25 MG per 12 hr capsule Take 1 capsule by mouth 2 (two) times daily.     . fluticasone (FLONASE) 50 MCG/ACT nasal spray Place 2 sprays into both nostrils daily.     Marland Kitchen lisinopril (PRINIVIL,ZESTRIL) 40 MG tablet Take 40 mg by mouth every morning.     . meclizine (ANTIVERT) 25 MG tablet Take 25 mg by mouth 2 (two) times daily.    . megestrol (MEGACE) 40 MG tablet Take 1 tablet (40 mg total) by mouth 3 (three) times daily. 90 tablet 3  . omeprazole (PRILOSEC) 40 MG capsule Take 40 mg by mouth at bedtime.     . ondansetron (ZOFRAN-ODT) 4 MG disintegrating tablet Take 4 mg by mouth 3 (three) times daily as needed for nausea or vomiting.      Home: Home Living Family/patient expects to be discharged to:: Private residence Living Arrangements: Alone Available Help at  Discharge: Family, Available PRN/intermittently Type of Home: House Home Access: Stairs to enter Technical brewer of Steps: 4 Entrance Stairs-Rails: Right Home Layout: One level Bathroom Shower/Tub: Multimedia programmer: Comfort: Environmental consultant - 2 wheels, Shower seat, Adaptive equipment, Bedside commode(3 wheeled walker) Additional Comments: has walker outside shower to assist with steadying  Functional History: Prior Function Level of Independence: Independent with assistive device(s) Comments: Pt performs all ADLs and IADLs in home. Uses AE for LB dressing. uses RW all the time; neighbor does grocery shopping and driving Functional Status:  Mobility: Bed Mobility Overal bed mobility: Needs Assistance Bed Mobility: Supine to Sit Supine to sit: Min guard General bed mobility comments: OOB in recliner Transfers Overall transfer level:  Needs assistance Equipment used: Rolling walker (2 wheeled) Transfers: Sit to/from Stand Sit to Stand: Min assist General transfer comment: Pt highly impulsive with unsafe use of RW. Ambulation/Gait Ambulation/Gait assistance: Min assist, Min guard Gait Distance (Feet): 50 Feet Assistive device: Rolling walker (2 wheeled) Gait Pattern/deviations: Step-to pattern, Decreased dorsiflexion - right, Decreased dorsiflexion - left, Decreased stride length, Trunk flexed General Gait Details: stiff/rigid B LE with gait; poor hip/knee flexion with swing phase of gait; very impulsive require verbal cueing for safety Gait velocity: decreased    ADL: ADL Overall ADL's : Needs assistance/impaired Eating/Feeding: Set up, Sitting Grooming: Oral care, Standing Grooming Details (indicate cue type and reason): Min A for standing balance and safety at sink Upper Body Bathing: Sitting, Set up, Supervision/ safety Lower Body Bathing: Minimal assistance, Sit to/from stand Upper Body Dressing : Set up, Supervision/safety, Sitting Lower Body  Dressing: Minimal assistance, Sit to/from stand Lower Body Dressing Details (indicate cue type and reason): Pt with increased difficulty bringing ankles to knees due to tightness.  Toilet Transfer: Minimal assistance, Cueing for safety, Ambulation(simulated ) Toilet Transfer Details (indicate cue type and reason): Min A for safety and requiring cues not to sit prematurely Functional mobility during ADLs: Minimal assistance, +2 for safety/equipment, Rolling walker, Cueing for safety General ADL Comments: Pt highly unsafe at this time. She is very motivated to complete tasks independently.   Cognition: Cognition Overall Cognitive Status: Impaired/Different from baseline Orientation Level: Oriented X4 Cognition Arousal/Alertness: Awake/alert Behavior During Therapy: WFL for tasks assessed/performed, Impulsive Overall Cognitive Status: Impaired/Different from baseline Area of Impairment: Attention, Memory, Following commands, Safety/judgement, Awareness Current Attention Level: Selective Memory: Decreased recall of precautions Following Commands: Follows one step commands inconsistently, Follows multi-step commands inconsistently Safety/Judgement: Decreased awareness of safety, Decreased awareness of deficits General Comments: Pt tangential with all activities. She is hard of hearing impacting her ability to follow directions at times. Poor safety awareness noted and decreased awareness in general.    Blood pressure (!) 120/51, pulse 74, temperature 98.4 F (36.9 C), temperature source Oral, resp. rate 16, height 5\' 2"  (1.575 m), weight 62 kg (136 lb 11 oz), SpO2 100 %. Physical Exam  Constitutional: She is oriented to person, place, and time. She appears well-developed and well-nourished.  HENT:  Head: Normocephalic and atraumatic.  Right Ear: External ear normal.  Left Ear: External ear normal.  Eyes: EOM are normal. Right eye exhibits no discharge. Left eye exhibits no discharge.    Neck: Normal range of motion. Neck supple.  Cardiovascular: Normal rate and regular rhythm.  Respiratory: Effort normal and breath sounds normal.  GI: Soft. Bowel sounds are normal.  Musculoskeletal:  No edema or tenderness in extremities  Neurological: She is alert and oriented to person, place, and time.  Motor: Bilateral upper extremities: 4+/5 proximal distal Bilateral lower extremity: Hip flexion 2+/5 knee extension to -/5, ankle dorsiflexion 2+/5 with increased tone HOH  Skin: Skin is warm and dry.  Psychiatric: Her affect is blunt. Her speech is delayed. She is slowed.    Results for orders placed or performed during the hospital encounter of 01/24/18 (from the past 24 hour(s))  Glucose, capillary     Status: Abnormal   Collection Time: 01/25/18  9:41 PM  Result Value Ref Range   Glucose-Capillary 143 (H) 70 - 99 mg/dL  CBC     Status: Abnormal   Collection Time: 01/26/18  3:55 AM  Result Value Ref Range   WBC 6.1 4.0 - 10.5 K/uL  RBC 3.41 (L) 3.87 - 5.11 MIL/uL   Hemoglobin 10.4 (L) 12.0 - 15.0 g/dL   HCT 32.6 (L) 36.0 - 46.0 %   MCV 95.6 78.0 - 100.0 fL   MCH 30.5 26.0 - 34.0 pg   MCHC 31.9 30.0 - 36.0 g/dL   RDW 13.7 11.5 - 15.5 %   Platelets 165 150 - 400 K/uL  Comprehensive metabolic panel     Status: Abnormal   Collection Time: 01/26/18  3:55 AM  Result Value Ref Range   Sodium 139 135 - 145 mmol/L   Potassium 4.7 3.5 - 5.1 mmol/L   Chloride 112 (H) 98 - 111 mmol/L   CO2 20 (L) 22 - 32 mmol/L   Glucose, Bld 100 (H) 70 - 99 mg/dL   BUN 20 8 - 23 mg/dL   Creatinine, Ser 0.99 0.44 - 1.00 mg/dL   Calcium 8.6 (L) 8.9 - 10.3 mg/dL   Total Protein 5.3 (L) 6.5 - 8.1 g/dL   Albumin 2.9 (L) 3.5 - 5.0 g/dL   AST 23 15 - 41 U/L   ALT 14 0 - 44 U/L   Alkaline Phosphatase 100 38 - 126 U/L   Total Bilirubin 0.4 0.3 - 1.2 mg/dL   GFR calc non Af Amer 49 (L) >60 mL/min   GFR calc Af Amer 57 (L) >60 mL/min   Anion gap 7 5 - 15  Glucose, capillary     Status: None    Collection Time: 01/26/18  6:35 AM  Result Value Ref Range   Glucose-Capillary 97 70 - 99 mg/dL  Troponin I     Status: Abnormal   Collection Time: 01/26/18  9:05 AM  Result Value Ref Range   Troponin I 0.36 (HH) <0.03 ng/mL   Dg Chest 1 View  Result Date: 01/24/2018 CLINICAL DATA:  blurred vision. States started on new chemo medication 1 week ago, saw PCP on Friday and treated for vertigo. Pt reports numbness and weakness to bilateral arms intermittently. Pt reports no improvement to blurred vision despite treatment for vertigo, doesn't feel head "is clear." EXAM: CHEST  1 VIEW COMPARISON:  09/03/2015 FINDINGS: Lungs are clear. Heart size upper limits normal. Aortic Atherosclerosis (ICD10-170.0). No effusion. Vertebral endplate spurring at multiple levels in the mid and lower thoracic spine. IMPRESSION: No acute cardiopulmonary disease. Electronically Signed   By: Lucrezia Europe M.D.   On: 01/24/2018 13:48   Ct Head Wo Contrast  Result Date: 01/24/2018 CLINICAL DATA:  Blurred vision and vertigo 2 days. Intermittent bilateral arm numbness and weakness. EXAM: CT HEAD WITHOUT CONTRAST TECHNIQUE: Contiguous axial images were obtained from the base of the skull through the vertex without intravenous contrast. COMPARISON:  None. FINDINGS: Brain: Ventricles, cisterns and other CSF spaces are normal. There is no mass, mass effect, shift of midline structures or acute hemorrhage. There is mild chronic ischemic microvascular disease. No evidence of acute infarction. Vascular: Significant calcified plaque over the right vertebral artery. Skull: Normal. Negative for fracture or focal lesion. Sinuses/Orbits: No acute finding. Other: None. IMPRESSION: No acute findings. Mild chronic ischemic microvascular disease. Electronically Signed   By: Marin Olp M.D.   On: 01/24/2018 13:31   Ct Angio Chest Pe W And/or Wo Contrast  Result Date: 01/24/2018 CLINICAL DATA:  Chest pain, blurred vision, vertigo history of  endometrial adenocarcinoma EXAM: CT ANGIOGRAPHY CHEST WITH CONTRAST TECHNIQUE: Multidetector CT imaging of the chest was performed using the standard protocol during bolus administration of intravenous contrast. Multiplanar CT image reconstructions  and MIPs were obtained to evaluate the vascular anatomy. CONTRAST:  149mL ISOVUE-370 IOPAMIDOL (ISOVUE-370) INJECTION 76% COMPARISON:  01/24/2018 chest x-ray FINDINGS: Cardiovascular: Pulmonary arteries are well visualized. Small hypodense filling defect within a right lower lobe medial segmental branch, images 168 through 180 consistent with a small right lower lobe pulmonary embolus. No significant saddle or central hilar embolus appreciated. RV to LV ratio is 0.86. Negative for heart strain. Aortic atherosclerosis noted. Negative for aneurysm. Native coronary atherosclerosis noted. Normal heart size. Small pericardial effusion present. Mediastinum/Nodes: No enlarged mediastinal, hilar, or axillary lymph nodes. Thyroid gland, trachea, and esophagus demonstrate no significant findings. Lungs/Pleura: Minor basilar atelectasis. No significant focal pneumonia, collapse or consolidation. Negative for edema, effusion, or pneumothorax. Trachea central airways are patent. No pleural abnormality. Upper Abdomen: Remote cholecystectomy. Mild biliary prominence, suspect postoperative. Abdominal atherosclerosis. Degenerative changes of the spine. No acute upper abdominal finding. Musculoskeletal: Degenerative changes of the spine with large thoracic osteophytes throughout. No acute compression fracture. Bones are osteopenic. Sternum intact. Review of the MIP images confirms the above findings. IMPRESSION: Small right lower lobe segmental pulmonary embolus peripherally within a medial right lower lobe segmental artery. Very low thrombus burden. No significant central hilar or proximal pulmonary embolus. Negative for heart strain. Small pericardial effusion, nonspecific. Native  coronary atherosclerosis Low lung volumes with bibasilar atelectasis. Aortic Atherosclerosis (ICD10-I70.0). Electronically Signed   By: Jerilynn Mages.  Shick M.D.   On: 01/24/2018 14:23   Mr Brain W And Wo Contrast  Result Date: 01/24/2018 CLINICAL DATA:  Vertigo. Blurred vision. Intermittent weakness and numbness in both arms. History of endometrial cancer. EXAM: MRI HEAD WITHOUT AND WITH CONTRAST TECHNIQUE: Multiplanar, multiecho pulse sequences of the brain and surrounding structures were obtained without and with intravenous contrast. CONTRAST:  33mL MULTIHANCE GADOBENATE DIMEGLUMINE 529 MG/ML IV SOLN COMPARISON:  Head CT 01/24/2018 and MRI 06/28/2003. FINDINGS: Brain: There are numerous small foci of acute to early subacute infarction throughout both cerebral and cerebellar hemispheres in a largely symmetric fashion. There is cortical involvement in the frontal and parietal lobes bilaterally as well as some involvement of the centrum semiovale. There is milder involvement of both occipital lobes. Two small infarcts asymmetrically involve the right caudate and corona radiata. Some of the infarcts enhance. No intracranial hemorrhage, mass, midline shift, or extra-axial fluid collection is identified. There are small chronic infarcts bilaterally in the cerebellum and in the left caudate and left lateral periventricular white matter. Tiny chronic lacunar infarcts or perivascular spaces are noted in the pons. Patchy T2 hyperintensities in the cerebral white matter bilaterally are nonspecific but compatible with mild-to-moderate chronic small vessel ischemic disease, progressed from 2004. A cavum velum interpositum is incidentally noted, a normal variant. Vascular: Major intracranial vascular flow voids are preserved. Skull and upper cervical spine: Unremarkable bone marrow signal. Sinuses/Orbits: Bilateral cataract extraction. Paranasal sinuses and mastoid air cells are clear. Other: None. IMPRESSION: 1. Small acute to  early subacute infarcts scattered throughout both cerebral and cerebellar hemispheres, possibly watershed ischemia given distribution and relative symmetry. Query hypoperfusion event. 2. Mild-to-moderate chronic small vessel ischemic disease. Chronic cerebellar infarcts. Electronically Signed   By: Logan Bores M.D.   On: 01/24/2018 16:17   Mr Jodene Nam Head Wo Contrast  Result Date: 01/25/2018 CLINICAL DATA:  Follow-up stroke. Bilateral cerebral and cerebellar infarcts on MRI. EXAM: MRA HEAD WITHOUT CONTRAST TECHNIQUE: Angiographic images of the Circle of Willis were obtained using MRA technique without intravenous contrast. COMPARISON:  Head MRA 06/28/2003 FINDINGS: The visualized distal vertebral arteries  are patent to the basilar with the right being dominant. There is mild left greater than right V4 segment irregularity without flow limiting stenosis. The basilar artery is widely patent, possibly with a small fenestration proximally. Patent bilateral PICA, right AICA, and bilateral SCA origins are visualized. There is a patent right posterior communicating artery. Both PCAs are patent without evidence of significant stenosis. The internal carotid arteries are patent from skull base to carotid termini without evidence of significant stenosis. The ACAs and MCAs are patent without evidence of proximal branch occlusion or significant proximal stenosis. No aneurysm is identified. IMPRESSION: Negative head MRA. Electronically Signed   By: Logan Bores M.D.   On: 01/25/2018 07:49    Assessment/Plan: Diagnosis: bilateral CVA Labs and images (see above) independently reviewed.  Records reviewed and summated above. Stroke: Continue secondary stroke prophylaxis and Risk Factor Modification listed below:   Antiplatelet therapy:   Blood Pressure Management:  Continue current medication with prn's with permisive HTN per primary team Statin Agent:   Prediabetes management:   Tobacco abuse:    1. Does the need for  close, 24 hr/day medical supervision in concert with the patient's rehab needs make it unreasonable for this patient to be served in a less intensive setting? Yes  2. Co-Morbidities requiring supervision/potential complications: hereditary spastic paraparesis, HTN (monitor and provide prns in accordance with increased physical exertion and pain), HOH, CVA with left sided weakness, endometrial CA (started on megace week PTA), prediabetes (Monitor in accordance with exercise and adjust meds as necessary), ABLA (transfuse if necessary to ensure appropriate perfusion for increased activity tolerance), PE (cont meds) 3. Due to bladder management, bowel management, safety, disease management, medication administration and patient education, does the patient require 24 hr/day rehab nursing? Yes 4. Does the patient require coordinated care of a physician, rehab nurse, PT (1-2 hrs/day, 5 days/week), OT (1-2 hrs/day, 5 days/week) and SLP (1-2 hrs/day, 5 days/week) to address physical and functional deficits in the context of the above medical diagnosis(es)? Yes Addressing deficits in the following areas: balance, endurance, locomotion, strength, transferring, bathing, dressing, toileting, cognition and psychosocial support 5. Can the patient actively participate in an intensive therapy program of at least 3 hrs of therapy per day at least 5 days per week? Yes 6. The potential for patient to make measurable gains while on inpatient rehab is excellent 7. Anticipated functional outcomes upon discharge from inpatient rehab are supervision  with PT, supervision with OT, supervision with SLP. 8. Estimated rehab length of stay to reach the above functional goals is: 13-17 days. 9. Anticipated D/C setting: Home 10. Anticipated post D/C treatments: HH therapy and Home excercise program 11. Overall Rehab/Functional Prognosis: good  RECOMMENDATIONS: This patient's condition is appropriate for continued rehabilitative care  in the following setting: Patient will require assistance at discharge, CIR if assistance available. Patient has agreed to participate in recommended program. Potentially Note that insurance prior authorization may be required for reimbursement for recommended care.  Comment: Rehab Admissions Coordinator to follow up.   I have personally performed a face to face diagnostic evaluation, including, but not limited to relevant history and physical exam findings, of this patient and developed relevant assessment and plan.  Additionally, I have reviewed and concur with the physician assistant's documentation above.   Delice Lesch, MD, ABPMR Bary Leriche, PA-C 01/26/2018

## 2018-01-26 NOTE — Discharge Instructions (Addendum)
Information on my medicine - ELIQUIS (apixaban)  This medication education was reviewed with me or my healthcare representative as part of my discharge preparation.    Why was Eliquis prescribed for you? Eliquis was prescribed to treat blood clots that may have been found in the veins of your legs (deep vein thrombosis) or in your lungs (pulmonary embolism) and to reduce the risk of them occurring again.  What do You need to know about Eliquis ? The starting dose is 10 mg (two 5 mg tablets) taken TWICE daily for the FIRST SEVEN (7) DAYS, then on (enter date)  01-31-18  the dose is reduced to ONE 5 mg tablet taken TWICE daily.  Eliquis may be taken with or without food.   Try to take the dose about the same time in the morning and in the evening. If you have difficulty swallowing the tablet whole please discuss with your pharmacist how to take the medication safely.  Take Eliquis exactly as prescribed and DO NOT stop taking Eliquis without talking to the doctor who prescribed the medication.  Stopping may increase your risk of developing a new blood clot.  Refill your prescription before you run out.  After discharge, you should have regular check-up appointments with your healthcare provider that is prescribing your Eliquis.    What do you do if you miss a dose? If a dose of ELIQUIS is not taken at the scheduled time, take it as soon as possible on the same day and twice-daily administration should be resumed. The dose should not be doubled to make up for a missed dose.  Important Safety Information A possible side effect of Eliquis is bleeding. You should call your healthcare provider right away if you experience any of the following: ? Bleeding from an injury or your nose that does not stop. ? Unusual colored urine (red or dark brown) or unusual colored stools (red or black). ? Unusual bruising for unknown reasons. ? A serious fall or if you hit your head (even if there is no  bleeding).  Some medicines may interact with Eliquis and might increase your risk of bleeding or clotting while on Eliquis. To help avoid this, consult your healthcare provider or pharmacist prior to using any new prescription or non-prescription medications, including herbals, vitamins, non-steroidal anti-inflammatory drugs (NSAIDs) and supplements.  This website has more information on Eliquis (apixaban): [email or web address]

## 2018-01-26 NOTE — Progress Notes (Signed)
Occupational Therapy Treatment Patient Details Name: Vicki Stein MRN: [id number] DOB: 1928-03-16 Today's Date: 01/26/2018    History of present illness 84 y.o. female who presented to the [facility] ED with blurred vision in conjunction with intermittent numbness and alternating weakness to bilateral upper extremities. PMH including CAD, GERD, HTN, paraplegia (spastic congenital), PVD, and "5 mini strokes".  MRI revealing small acute/subacute infarcts scattered throughout both cerebral and cerebellar hemispheres, possibly watershed ischemia.   OT comments  Pt demonstrating progress toward OT goals this session but remains highly impulsive with poor safety awareness, decreased awareness of deficits, and instability on her feet. Pt is hard of hearing impacting her full cognitive assessment. Pt requiring min assist for LB ADL and ambulation during ADL participation. Attempted shower transfer education but pt unable to safely participate and attend to directions. Continue to recommend CIR level therapies post-acute D/C to maximize independence and safety with ADL and functional mobility.    Follow Up Recommendations  CIR;Supervision/Assistance - 24 hour    Equipment Recommendations  None recommended by OT    Recommendations for Other Services Rehab consult;PT consult    Precautions / Restrictions Precautions Precautions: Fall Restrictions Weight Bearing Restrictions: No       Mobility Bed Mobility               General bed mobility comments: OOB in recliner  Transfers Overall transfer level: Needs assistance Equipment used: Rolling walker (2 wheeled) Transfers: Sit to/from Stand Sit to Stand: Min assist         General transfer comment: Pt highly impulsive with unsafe use of RW.    Balance Overall balance assessment: Needs assistance Sitting-balance support: No upper extremity supported;Feet supported Sitting balance-Leahy Scale: Good     Standing balance support:  Bilateral upper extremity supported;During functional activity Standing balance-Leahy Scale: Poor Standing balance comment: Relies on external assist or RW for support.                            ADL either performed or assessed with clinical judgement   ADL Overall ADL's : Needs assistance/impaired Eating/Feeding: Set up;Sitting   Grooming: Oral care;Standing               Lower Body Dressing: Minimal assistance;Sit to/from stand Lower Body Dressing Details (indicate cue type and reason): Pt with increased difficulty bringing ankles to knees due to tightness.  Toilet Transfer: Minimal assistance;Cueing for safety;Ambulation(simulated ) Toilet Transfer Details (indicate cue type and reason): Min A for safety and requiring cues not to sit prematurely         Functional mobility during ADLs: Minimal assistance;+2 for safety/equipment;Rolling walker;Cueing for safety General ADL Comments: Pt highly unsafe at this time. She is very motivated to complete tasks independently.      Vision   Additional Comments: Pt reporting blurred vision with eye movement. Pt describing similar symptoms at home.    Perception     Praxis      Cognition Arousal/Alertness: Awake/alert Behavior During Therapy: WFL for tasks assessed/performed;Impulsive Overall Cognitive Status: Impaired/Different from baseline Area of Impairment: Attention;Memory;Following commands;Safety/judgement;Awareness                   Current Attention Level: Selective Memory: Decreased recall of precautions Following Commands: Follows one step commands inconsistently;Follows multi-step commands inconsistently Safety/Judgement: Decreased awareness of safety;Decreased awareness of deficits     General Comments: Pt tangential with all activities. She is hard of hearing impacting  her ability to follow directions at times. Poor safety awareness noted and decreased awareness in general.          Exercises     Shoulder Instructions       General Comments      Pertinent Vitals/ Pain       Pain Assessment: No/denies pain  Home Living                                          Prior Functioning/Environment              Frequency  Min 3X/week        Progress Toward Goals  OT Goals(current goals can now be found in the care plan section)  Progress towards OT goals: Progressing toward goals  Acute Rehab OT Goals Patient Stated Goal: "Go home and return to my life." OT Goal Formulation: With patient Time For Goal Achievement: 02/08/18 Potential to Achieve Goals: Good  Plan Discharge plan remains appropriate    Co-evaluation                 AM-PAC PT "6 Clicks" Daily Activity     Outcome Measure   Help from another person eating meals?: None Help from another person taking care of personal grooming?: A Little Help from another person toileting, which includes using toliet, bedpan, or urinal?: A Little Help from another person bathing (including washing, rinsing, drying)?: A Little Help from another person to put on and taking off regular upper body clothing?: A Little Help from another person to put on and taking off regular lower body clothing?: A Little 6 Click Score: 19    End of Session Equipment Utilized During Treatment: Gait belt;Rolling walker  OT Visit Diagnosis: Unsteadiness on feet (R26.81);Other abnormalities of gait and mobility (R26.89);Muscle weakness (generalized) (M62.81);History of falling (Z91.81)   Activity Tolerance Patient tolerated treatment well   Patient Left in chair;with call bell/phone within reach;with chair alarm set   Nurse Communication          Time: [phone] OT Time Calculation (min): 35 min  Charges: OT General Charges $OT Visit: 1 Visit OT Treatments $Self Care/Home Management : 23-37 mins  Norman Herrlich, MS OTR/L  Pager: [place] 01/26/2018, 11:04  AM

## 2018-01-26 NOTE — Progress Notes (Signed)
Physical Therapy Treatment Patient Details Name: Vicki Stein MRN: [id number] DOB: 06-Nov-1927 Today's Date: 01/26/2018    History of Present Illness 84 y.o. female who presented to the [facility] ED with blurred vision in conjunction with intermittent numbness and alternating weakness to bilateral upper extremities. PMH including CAD, GERD, HTN, paraplegia (spastic congenital), PVD, and "5 mini strokes".  MRI revealing small acute/subacute infarcts scattered throughout both cerebral and cerebellar hemispheres, possibly watershed ischemia.    PT Comments    Patient requires assistance for all OOB mobility and continues to present with impaired cognition and balance/gait. Continue to progress as tolerated.    Follow Up Recommendations  CIR;Supervision/Assistance - 24 hour     Equipment Recommendations  None recommended by PT    Recommendations for Other Services OT consult;Rehab consult     Precautions / Restrictions Precautions Precautions: Fall Restrictions Weight Bearing Restrictions: No    Mobility  Bed Mobility               General bed mobility comments: OOB in recliner upon arrival  Transfers Overall transfer level: Needs assistance Equipment used: Rolling walker (2 wheeled) Transfers: Sit to/from Stand Sit to Stand: Min assist         General transfer comment: cues for safe hand placement and assist to steady  Ambulation/Gait Ambulation/Gait assistance: Min assist;+2 safety/equipment(chair follow) Gait Distance (Feet): 75 Feet Assistive device: Rolling walker (2 wheeled) Gait Pattern/deviations: Step-to pattern;Decreased step length - left;Decreased stride length;Decreased dorsiflexion - right;Decreased dorsiflexion - left;Trunk flexed;Wide base of support     General Gait Details: pt with limited bilat LE flexion with gait and grossly maintains hip/knee extension; cues for gait velocity and safe use of AD; assistance to steady and guide Rw at times; pt  required rest break due to SOB and fatigue   Stairs Stairs: Yes Stairs assistance: Min assist Stair Management: Two rails;Step to pattern;Forwards Number of Stairs: 2 General stair comments: cues for safety with foot placement; assistance for balance; pt reliant on bilat UE support; pt with more difficulty descending steps due to limited bilat knee ROM    Wheelchair Mobility    Modified Rankin (Stroke Patients Only)       Balance Overall balance assessment: Needs assistance Sitting-balance support: No upper extremity supported;Feet supported Sitting balance-Leahy Scale: Good     Standing balance support: Bilateral upper extremity supported;During functional activity Standing balance-Leahy Scale: Poor Standing balance comment: Relies on external assist or RW for support.                             Cognition Arousal/Alertness: Awake/alert Behavior During Therapy: WFL for tasks assessed/performed;Impulsive Overall Cognitive Status: Impaired/Different from baseline Area of Impairment: Attention;Memory;Following commands;Safety/judgement;Awareness                   Current Attention Level: Selective Memory: Decreased recall of precautions Following Commands: Follows one step commands inconsistently;Follows multi-step commands inconsistently Safety/Judgement: Decreased awareness of safety;Decreased awareness of deficits     General Comments: Pt tangential with all activities. She is hard of hearing impacting her ability to follow directions at times. Poor safety awareness noted and decreased awareness in general.       Exercises      General Comments        Pertinent Vitals/Pain Pain Assessment: No/denies pain    Home Living     Available Help at Discharge: Family;Available PRN/intermittently Type of Home: House  Prior Function            PT Goals (current goals can now be found in the care plan section) Acute Rehab PT  Goals Patient Stated Goal: "Go home and return to my life." Progress towards PT goals: Progressing toward goals    Frequency    Min 4X/week      PT Plan Current plan remains appropriate    Co-evaluation              AM-PAC PT "6 Clicks" Daily Activity  Outcome Measure  Difficulty turning over in bed (including adjusting bedclothes, sheets and blankets)?: A Little Difficulty moving from lying on back to sitting on the side of the bed? : A Little Difficulty sitting down on and standing up from a chair with arms (e.g., wheelchair, bedside commode, etc,.)?: Unable Help needed moving to and from a bed to chair (including a wheelchair)?: A Little Help needed walking in hospital room?: A Little Help needed climbing 3-5 steps with a railing? : A Lot 6 Click Score: 15    End of Session Equipment Utilized During Treatment: Gait belt Activity Tolerance: Patient tolerated treatment well Patient left: in chair;with call bell/phone within reach;with chair alarm set Nurse Communication: Mobility status PT Visit Diagnosis: Unsteadiness on feet (R26.81);Other abnormalities of gait and mobility (R26.89);Muscle weakness (generalized) (M62.81)     Time: 1120-1140 PT Time Calculation (min) (ACUTE ONLY): 20 min  Charges:  $Gait Training: 8-22 mins                    G Codes:       Earney Navy, PTA Pager: [phone]     Darliss Cheney 01/26/2018, 1:56 PM

## 2018-01-26 NOTE — Progress Notes (Addendum)
STROKE TEAM PROGRESS NOTE   HISTORY OF PRESENT ILLNESS (per record) Vicki Stein is an 84 y.o. female who presented to the [facility] ED with blurred vision in conjunction with intermittent numbness and alternating weakness to bilateral upper extremities. Also with vertigo, which was recently successfully treated with meclizine. She also endorsed generalized weakness. Megace which was started for her cancer last week was initially suspected to be the etiology for her neurological symptoms. She was started on a new chemotherapy medication 1 week ago for her endometrial adenocarcinoma.   CT chest in the ED revealed small right lower lobe segmental pulmonary embolus peripherally within a medial right lower lobe segmental artery. Very low thrombus burden. No significant central hilar or proximal pulmonary embolus. Negative for heart strain.  She was started on apixaban for the PE. Her home Aggrenox has been discontinued.   MRI brain revealed multiple small foci of cortical/juxtacortical restricted diffusion within the cerebral hemispheres, as well as cerebellar foci of restricted diffusion.   PMHx includes CAD, CVA with residual cognitive deficits, congenital spastic paraplegia, PVD, HTN and borderline diabetes.      SUBJECTIVE (INTERVAL HISTORY) The patient's daughter-in-law was at the bedside.  The patient is very hard of hearing.  She has residual left-sided weakness from a previous stroke.  She denied chest pain or shortness of breath.  Apparently she had seen Dr. Brett Fairy in the past for her strokes. She sees Vicki Sleigh MD for her endometrial adenocarcinoma.     OBJECTIVE Temp:  [98 F (36.7 C)-98.5 F (36.9 C)] 98.5 F (36.9 C) (07/22 1220) Pulse Rate:  [72-91] 84 (07/22 1220) Cardiac Rhythm: Normal sinus rhythm (07/22 0800) Resp:  [16-20] 16 (07/22 1220) BP: (104-145)/(51-74) 104/51 (07/22 1220) SpO2:  [97 %-100 %] 98 % (07/22 1220) Weight:  [136 lb 11 oz (62 kg)] 136 lb  11 oz (62 kg) (07/21 1511)  CBC:  Recent Labs  Lab 01/24/18 1240  01/25/18 0012 01/26/18 0355  WBC 7.6  --  6.6 6.1  NEUTROABS 5.6  --   --   --   HGB 10.9*   < > 10.5* 10.4*  HCT 34.4*   < > 33.0* 32.6*  MCV 96.1  --  95.1 95.6  PLT 179  --  173 165   < > = values in this interval not displayed.    Basic Metabolic Panel:  Recent Labs  Lab 01/25/18 0012 01/26/18 0355  NA 140 139  K 4.2 4.7  CL 113* 112*  CO2 19* 20*  GLUCOSE 127* 100*  BUN 20 20  CREATININE 1.04* 0.99  CALCIUM 8.7* 8.6*    Lipid Panel:     Component Value Date/Time   CHOL 103 01/25/2018 0012   TRIG 74 01/25/2018 0012   HDL 33 (L) 01/25/2018 0012   CHOLHDL 3.1 01/25/2018 0012   VLDL 15 01/25/2018 0012   LDLCALC 55 01/25/2018 0012   HgbA1c:  Lab Results  Component Value Date   HGBA1C 5.8 (H) 01/25/2018   Urine Drug Screen: No results found for: LABOPIA, COCAINSCRNUR, LABBENZ, AMPHETMU, THCU, LABBARB  Alcohol Level No results found for: [facility]  IMAGING   Dg Chest 1 View 01/24/2018 IMPRESSION:  No acute cardiopulmonary disease.    Ct Head Wo Contrast 01/24/2018 IMPRESSION:  No acute findings. Mild chronic ischemic microvascular disease.    Ct Angio Chest Pe W And/or Wo Contrast 01/24/2018 IMPRESSION:  Small right lower lobe segmental pulmonary embolus peripherally within a medial right lower lobe segmental artery.  Very low thrombus burden. No significant central hilar or proximal pulmonary embolus. Negative for heart strain. Small pericardial effusion, nonspecific. Native coronary atherosclerosis Low lung volumes with bibasilar atelectasis. Aortic Atherosclerosis (ICD10-I70.0).     Mr Jeri Cos And Wo Contrast 01/24/2018 IMPRESSION:  1. Small acute to early subacute infarcts scattered throughout both cerebral and cerebellar hemispheres, possibly watershed ischemia given distribution and relative symmetry. Query hypoperfusion event.  2. Mild-to-moderate chronic small vessel ischemic  disease. Chronic cerebellar infarcts.     Mr Jodene Nam Head Wo Contrast 01/25/2018 IMPRESSION:  Negative head MRA.     Trans Cranial Doppler Study With Bubbles - pending 01/25/2018     Transthoracic Echocardiogram  00/00/00 Study Conclusions - Left ventricle: The cavity size was normal. Systolic function was   normal. The estimated ejection fraction was in the range of 55%   to 60%. Wall motion was normal; there were no regional wall   motion abnormalities. Doppler parameters are consistent with   abnormal left ventricular relaxation (grade 1 diastolic   dysfunction). - Mitral valve: Mildly calcified annulus. There was mild   regurgitation. - Pulmonary arteries: Systolic pressure was mildly increased. PA   peak pressure: 32 mm Hg (S).    Bilateral Carotid Dopplers  01/25/2018 Preliminary report:  1-39% ICA plaquing. Vertebral artery flow is antegrade.     PHYSICAL EXAM Vitals:   01/25/18 2355 01/26/18 0447 01/26/18 0801 01/26/18 1220  BP: (!) 120/56 (!) 107/55 (!) 120/51 (!) 104/51  Pulse: 76 72 74 84  Resp: 20 20 16 16   Temp: 98 F (36.7 C) 98 F (36.7 C) 98.4 F (36.9 C) 98.5 F (36.9 C)  TempSrc: Oral Oral Oral Oral  SpO2: 97% 99% 100% 98%  Weight:      Height:       Pleasant elderly Caucasian lady currently not in distress. . Afebrile. Head is nontraumatic. Neck is supple without bruit.    Cardiac exam no murmur or gallop. Lungs are clear to auscultation. Distal pulses are well felt.  Neurological Exam :  Awake alert oriented 3 with normal speech and language function. No aphasia or apraxia dysarthria. Extraocular moments are full range without nystagmus but there is dysmetric saccades bilaterally. Face is symmetric without weakness. Tongue midline. Motor system exam reveals symmetric bilateral upper extremity strength, tone and reflexes and coordination. Lower extremity bilateral paraparesis with proximal 3/5 hip flexion and 4/5 knee weakness. Weakness of ankle  dorsiflexors bilaterally. Tone is increased in both lower extremities with spasticity. Deep tendon reflexes are brisker in both lower extremities than in upper extremities. Sensation is intact bilaterally. Plantars about upgoing. Gait not tested.         ASSESSMENT/PLAN Ms. KARRINGTON MCCRAVY is a 84 y.o. female with history of previous strokes, prediabetes, peripheral vascular disease, history of migraine headaches hypertension, hyperlipidemia, spastic congenital paraplegia, endometrial adenocarcinoma, coronary artery disease, and recent pulmonary emboli presenting with blurred vision, alternating weakness of both upper extremities, vertigo, and generalized weakness.  She did not receive IV t-PA due to late presentation and anticoagulation for pulmonary emboli   Strokes:  Small acute to early subacute infarcts scattered throughout both cerebral and cerebellar hemispheres - cardiacembolic - versus due to hypercoagulable state related to adenocarcinoma.  Resultant  Generalized weakness  CT head - No acute findings.  MRI head - Small acute to early subacute infarcts scattered throughout both cerebral and cerebellar hemispheres.  Chronic cerebellar infarcts.  MRA head - negative  TCD w bubbles - pending  Carotid  Doppler - Preliminary report:  1-39% ICA plaquing. Vertebral artery flow is antegrade.   2D Echo - EF 55 to 60%.  No cardiac source of emboli identified.  LDL - 55  HgbA1c - 5.8  VTE prophylaxis - Eliquis Diet Order           Diet Heart Room service appropriate? Yes; Fluid consistency: Thin  Diet effective now          dipyridamole SR 250 mg/aspirin 25 mg twice a day prior to admission, now on Eliquis (apixaban) daily  Patient counseled to be compliant with her antithrombotic medications  Ongoing aggressive stroke risk factor management  Therapy recommendations:  pending  Disposition:  Pending  Hypertension  Stable . Permissive hypertension (OK if < 220/120)  but gradually normalize in 5-7 days . Long-term BP goal normotensive  Hyperlipidemia  Lipid lowering medication PTA: Lipitor 40 mg daily  LDL 55, goal < 70  Current lipid lowering medication: Lipitor 40 mg daily  Continue statin at discharge  Pre - Diabetes  HgbA1c 5.8, goal < 7.0  Controlled  Other Stroke Risk Factors  Advanced age  Hx stroke/TIA  Coronary artery disease  Migraines  Hypercoagulable state secondary to malignancy  Other Active Problems  Endometrial adenocarcinoma  Multiple recent pulmonary emboli -> Eliquis  Anemia  Mildly elevated creatinine  Hearing impaired   Hospital day # 1  Mikey Bussing PA-C [facility] Pager [phone] 01/26/2018, 2:47 PM  She has presented with generalized symptoms but MRI shows tiny by cerebral and cerebellar infarcts which may be related to hypercoagulable, adenocarcinoma versus marantic endocarditis. She likely needs long-term anticoagulation given recent pulmonary emboli. Hence TEE will not help.Check transcranial Doppler bubble study for PFO and right to left shunt Long discussion with patient and  answered questions.   Greater than 50% time during this study family visit was spent on counseling and coordination of care about her bi-cerebral multiple strokes, relationship with cancer and discussion about evaluation and treatment plan  Antony Contras, MD Medical Director [facility] Pager: [phone] 01/26/2018 2:47 PM  ADDENDUM : Unable to do TCD Bubble study due to poor windows throughout. Stroke team will sign off. Call for questions, D/wDr Camila Li, MD To contact Stroke Continuity provider, please refer to [email or web address] After hours, contact General Neurology

## 2018-01-26 NOTE — Progress Notes (Signed)
Inpatient Rehabilitation  Met with patient at bedside and spoke with son, Wille Glaser via phone to discuss team's recommendation for IP Rehab.  Shared booklets, insurance verification letter, and answered questions.  Patient in agreement with plans for IP Rehab and family discussing assisting her upon discharge.  Plan to follow for timing of medical readiness, and IP Rehab bed availability.  Call if questions.    Carmelia Roller., CCC/SLP Admission Coordinator  [place]  Cell [phone]

## 2018-01-27 ENCOUNTER — Encounter (HOSPITAL_COMMUNITY): Payer: Self-pay | Admitting: *Deleted

## 2018-01-27 ENCOUNTER — Other Ambulatory Visit: Payer: Self-pay

## 2018-01-27 ENCOUNTER — Inpatient Hospital Stay (HOSPITAL_COMMUNITY)
Admission: RE | Admit: 2018-01-27 | Discharge: 2018-02-04 | DRG: 057 | Disposition: A | Discharge status: Home Health | Payer: Medicare Other | Source: Intra-hospital | Attending: Physical Medicine & Rehabilitation | Admitting: Physical Medicine & Rehabilitation

## 2018-01-27 DIAGNOSIS — I63439 Cerebral infarction due to embolism of unspecified posterior cerebral artery: Secondary | ICD-10-CM

## 2018-01-27 DIAGNOSIS — Z86711 Personal history of pulmonary embolism: Secondary | ICD-10-CM

## 2018-01-27 DIAGNOSIS — I2510 Atherosclerotic heart disease of native coronary artery without angina pectoris: Secondary | ICD-10-CM | POA: Diagnosis present

## 2018-01-27 DIAGNOSIS — I10 Essential (primary) hypertension: Secondary | ICD-10-CM | POA: Diagnosis present

## 2018-01-27 DIAGNOSIS — I69398 Other sequelae of cerebral infarction: Secondary | ICD-10-CM | POA: Diagnosis not present

## 2018-01-27 DIAGNOSIS — Z8542 Personal history of malignant neoplasm of other parts of uterus: Secondary | ICD-10-CM

## 2018-01-27 DIAGNOSIS — R404 Transient alteration of awareness: Secondary | ICD-10-CM | POA: Diagnosis not present

## 2018-01-27 DIAGNOSIS — E46 Unspecified protein-calorie malnutrition: Secondary | ICD-10-CM | POA: Diagnosis present

## 2018-01-27 DIAGNOSIS — I739 Peripheral vascular disease, unspecified: Secondary | ICD-10-CM | POA: Diagnosis present

## 2018-01-27 DIAGNOSIS — I2699 Other pulmonary embolism without acute cor pulmonale: Secondary | ICD-10-CM | POA: Diagnosis present

## 2018-01-27 DIAGNOSIS — D638 Anemia in other chronic diseases classified elsewhere: Secondary | ICD-10-CM | POA: Diagnosis present

## 2018-01-27 DIAGNOSIS — C541 Malignant neoplasm of endometrium: Secondary | ICD-10-CM | POA: Diagnosis present

## 2018-01-27 DIAGNOSIS — K219 Gastro-esophageal reflux disease without esophagitis: Secondary | ICD-10-CM | POA: Diagnosis present

## 2018-01-27 DIAGNOSIS — I63419 Cerebral infarction due to embolism of unspecified middle cerebral artery: Secondary | ICD-10-CM

## 2018-01-27 DIAGNOSIS — Z9181 History of falling: Secondary | ICD-10-CM | POA: Diagnosis not present

## 2018-01-27 DIAGNOSIS — I952 Hypotension due to drugs: Secondary | ICD-10-CM | POA: Diagnosis not present

## 2018-01-27 DIAGNOSIS — I959 Hypotension, unspecified: Secondary | ICD-10-CM | POA: Diagnosis not present

## 2018-01-27 DIAGNOSIS — I6349 Cerebral infarction due to embolism of other cerebral artery: Secondary | ICD-10-CM | POA: Diagnosis not present

## 2018-01-27 DIAGNOSIS — H538 Other visual disturbances: Secondary | ICD-10-CM | POA: Diagnosis present

## 2018-01-27 DIAGNOSIS — R1310 Dysphagia, unspecified: Secondary | ICD-10-CM | POA: Diagnosis not present

## 2018-01-27 DIAGNOSIS — I69351 Hemiplegia and hemiparesis following cerebral infarction affecting right dominant side: Secondary | ICD-10-CM | POA: Diagnosis not present

## 2018-01-27 DIAGNOSIS — E785 Hyperlipidemia, unspecified: Secondary | ICD-10-CM | POA: Diagnosis present

## 2018-01-27 DIAGNOSIS — H9190 Unspecified hearing loss, unspecified ear: Secondary | ICD-10-CM | POA: Diagnosis present

## 2018-01-27 DIAGNOSIS — E7800 Pure hypercholesterolemia, unspecified: Secondary | ICD-10-CM | POA: Diagnosis present

## 2018-01-27 DIAGNOSIS — R000 Tachycardia, unspecified: Secondary | ICD-10-CM | POA: Diagnosis not present

## 2018-01-27 DIAGNOSIS — R269 Unspecified abnormalities of gait and mobility: Secondary | ICD-10-CM | POA: Diagnosis not present

## 2018-01-27 DIAGNOSIS — N939 Abnormal uterine and vaginal bleeding, unspecified: Secondary | ICD-10-CM

## 2018-01-27 DIAGNOSIS — I6319 Cerebral infarction due to embolism of other precerebral artery: Secondary | ICD-10-CM | POA: Diagnosis not present

## 2018-01-27 DIAGNOSIS — G894 Chronic pain syndrome: Secondary | ICD-10-CM | POA: Diagnosis present

## 2018-01-27 DIAGNOSIS — R7303 Prediabetes: Secondary | ICD-10-CM

## 2018-01-27 DIAGNOSIS — G9340 Encephalopathy, unspecified: Secondary | ICD-10-CM | POA: Diagnosis not present

## 2018-01-27 DIAGNOSIS — D62 Acute posthemorrhagic anemia: Secondary | ICD-10-CM

## 2018-01-27 DIAGNOSIS — R7989 Other specified abnormal findings of blood chemistry: Secondary | ICD-10-CM | POA: Diagnosis not present

## 2018-01-27 DIAGNOSIS — I639 Cerebral infarction, unspecified: Secondary | ICD-10-CM | POA: Diagnosis not present

## 2018-01-27 DIAGNOSIS — R252 Cramp and spasm: Secondary | ICD-10-CM | POA: Diagnosis not present

## 2018-01-27 DIAGNOSIS — G114 Hereditary spastic paraplegia: Secondary | ICD-10-CM | POA: Diagnosis present

## 2018-01-27 DIAGNOSIS — R4182 Altered mental status, unspecified: Secondary | ICD-10-CM | POA: Diagnosis not present

## 2018-01-27 LAB — CBC
HEMATOCRIT: 32.5 % — AB (ref 36.0–46.0)
Hemoglobin: 10.3 g/dL — ABNORMAL LOW (ref 12.0–15.0)
MCH: 29.9 pg (ref 26.0–34.0)
MCHC: 31.7 g/dL (ref 30.0–36.0)
MCV: 94.5 fL (ref 78.0–100.0)
Platelets: 166 10*3/uL (ref 150–400)
RBC: 3.44 MIL/uL — ABNORMAL LOW (ref 3.87–5.11)
RDW: 13.6 % (ref 11.5–15.5)
WBC: 6.7 10*3/uL (ref 4.0–10.5)

## 2018-01-27 MED ORDER — ACETAMINOPHEN 325 MG PO TABS
325.0000 mg | ORAL_TABLET | ORAL | Status: DC | PRN
  Administered 2018-01-28 – 2018-02-03 (×12): 650 mg via ORAL
  Filled 2018-01-27 (×11): qty 2

## 2018-01-27 MED ORDER — POLYETHYLENE GLYCOL 3350 17 G PO PACK
17.0000 g | PACK | Freq: Every day | ORAL | Status: DC | PRN
  Administered 2018-01-30: 17 g via ORAL
  Filled 2018-01-27: qty 1

## 2018-01-27 MED ORDER — DIPHENHYDRAMINE HCL 12.5 MG/5ML PO ELIX: 12.5000 mg | ORAL_SOLUTION | Freq: Four times a day (QID) | ORAL | Status: DC | PRN

## 2018-01-27 MED ORDER — ATORVASTATIN CALCIUM 40 MG PO TABS
40.0000 mg | ORAL_TABLET | Freq: Every evening | ORAL | Status: DC
  Administered 2018-01-27 – 2018-02-03 (×8): 40 mg via ORAL
  Filled 2018-01-27 (×8): qty 1

## 2018-01-27 MED ORDER — AMLODIPINE BESYLATE 2.5 MG PO TABS: 2.5000 mg | ORAL_TABLET | Freq: Two times a day (BID) | ORAL | Status: DC | PRN

## 2018-01-27 MED ORDER — TRAZODONE HCL 50 MG PO TABS: 25.0000 mg | ORAL_TABLET | Freq: Every evening | ORAL | Status: DC | PRN

## 2018-01-27 MED ORDER — DOCUSATE SODIUM 100 MG PO CAPS
100.0000 mg | ORAL_CAPSULE | Freq: Two times a day (BID) | ORAL | Status: DC
  Administered 2018-01-27 – 2018-02-04 (×16): 100 mg via ORAL
  Filled 2018-01-27 (×16): qty 1

## 2018-01-27 MED ORDER — PROCHLORPERAZINE MALEATE 5 MG PO TABS: 5.0000 mg | ORAL_TABLET | Freq: Four times a day (QID) | ORAL | Status: DC | PRN

## 2018-01-27 MED ORDER — PROCHLORPERAZINE 25 MG RE SUPP
12.5000 mg | Freq: Four times a day (QID) | RECTAL | Status: DC | PRN
Start: 2018-01-27 — End: 2018-02-04

## 2018-01-27 MED ORDER — PANTOPRAZOLE SODIUM 40 MG PO TBEC
40.0000 mg | DELAYED_RELEASE_TABLET | Freq: Every day | ORAL | Status: DC
  Administered 2018-01-28 – 2018-02-04 (×8): 40 mg via ORAL
  Filled 2018-01-27 (×8): qty 1

## 2018-01-27 MED ORDER — PROCHLORPERAZINE EDISYLATE 10 MG/2ML IJ SOLN: 5.0000 mg | Freq: Four times a day (QID) | INTRAMUSCULAR | Status: DC | PRN

## 2018-01-27 MED ORDER — MEGESTROL ACETATE 40 MG PO TABS
40.0000 mg | ORAL_TABLET | Freq: Three times a day (TID) | ORAL | Status: DC
  Administered 2018-01-27 – 2018-02-04 (×23): 40 mg via ORAL
  Filled 2018-01-27 (×25): qty 1

## 2018-01-27 MED ORDER — BISACODYL 10 MG RE SUPP: 10.0000 mg | Freq: Every day | RECTAL | Status: DC | PRN

## 2018-01-27 MED ORDER — GUAIFENESIN-DM 100-10 MG/5ML PO SYRP: 5.0000 mL | ORAL_SOLUTION | Freq: Four times a day (QID) | ORAL | Status: DC | PRN

## 2018-01-27 MED ORDER — APIXABAN 5 MG PO TABS
10.0000 mg | ORAL_TABLET | Freq: Two times a day (BID) | ORAL | Status: AC
  Administered 2018-01-27 – 2018-01-31 (×8): 10 mg via ORAL
  Filled 2018-01-27 (×8): qty 2

## 2018-01-27 MED ORDER — FLEET ENEMA 7-19 GM/118ML RE ENEM: 1.0000 | ENEMA | Freq: Once | RECTAL | Status: DC | PRN

## 2018-01-27 MED ORDER — APIXABAN 5 MG PO TABS
5.0000 mg | ORAL_TABLET | Freq: Two times a day (BID) | ORAL | Status: DC
  Administered 2018-01-31 – 2018-02-04 (×8): 5 mg via ORAL
  Filled 2018-01-27 (×8): qty 1

## 2018-01-27 MED ORDER — FLUTICASONE PROPIONATE 50 MCG/ACT NA SUSP
2.0000 | Freq: Every day | NASAL | Status: DC
  Administered 2018-01-29 – 2018-02-03 (×6): 2 via NASAL
  Filled 2018-01-27 (×2): qty 16

## 2018-01-27 MED ORDER — DICLOFENAC SODIUM 1 % TD GEL
2.0000 g | Freq: Four times a day (QID) | TRANSDERMAL | Status: AC
  Administered 2018-01-27 – 2018-01-28 (×4): 2 g via TOPICAL
  Filled 2018-01-27: qty 100

## 2018-01-27 MED ORDER — ALUM & MAG HYDROXIDE-SIMETH 200-200-20 MG/5ML PO SUSP: 30.0000 mL | ORAL | Status: DC | PRN

## 2018-01-27 MED ORDER — MECLIZINE HCL 25 MG PO TABS
25.0000 mg | ORAL_TABLET | Freq: Two times a day (BID) | ORAL | Status: DC
  Administered 2018-01-27 – 2018-02-04 (×16): 25 mg via ORAL
  Filled 2018-01-27 (×16): qty 1

## 2018-01-27 MED ORDER — LISINOPRIL 40 MG PO TABS
40.0000 mg | ORAL_TABLET | Freq: Every morning | ORAL | Status: DC
  Administered 2018-01-28 – 2018-01-31 (×4): 40 mg via ORAL
  Filled 2018-01-27 (×4): qty 1

## 2018-01-27 NOTE — Progress Notes (Signed)
Vicki Stein  EUM:[id number] DOB: 07-02-1928 DOA: 01/24/2018 PCP: Glenford Bayley, DO    Brief Narrative:  84 y.o. F w/ a hx of endometrial adenocarcinoma, CAD, HTN, PVD, prior stroke, and spastic congenital paraplegia who presented w/ 1 day of B blurry vision, numbness that alternates between both arms, vertigo, and generalized weakness.  She was started on Megace the week prior for her cancer.   In the ED a CTa chest noted small pulmonary emboli incidentally.  After admit, an MRI of the brain noted acute B small CVAs.    Significant Events: 7/20 admit 7/21 carotid dopplers - 1-39% B ICA plaque 7/21 TTE - EF 55-60% - grade 1 DD - no WMA - no evidence of vegetations  7/21 MRA head - negative   Subjective: The patient is sitting up at the bedside.  She is alert and oriented.  She reports that she feels very good overall though she admits she feels weak in general.  She states she is looking forward to rehabilitation.  She denies chest pain shortness of breath nausea or vomiting.  Assessment & Plan:  Small subsegmental pulmonary emboli Very low clot burden - cont Eliquis - given concomitant embolic CVA shower will need to remain on anticoag lifelong   Multifocal cortical infarctions bilaterally + bilateral cerebellar infarctions suspicious for cardioembolic etiology - workup directed by Neurology - no indication for TEE as pt will require lifelong anticoag regardless - TCD results pending   Modestly elevated troponin likely enzyme leak related to small subsegmental pulmonary emboli - recheck confirmed trending down/normalizing - no chest pain   Recent Labs  Lab 01/24/18 1750 01/25/18 0012 01/25/18 0636 01/26/18 0905  TROPONINI 0.34* 0.44* 0.52* 0.36*    Endometrial adenocarcinoma Recently her Mirena IUD was removed - is on Megace - pain meds adjusted 7/22   Prior hx of Cerebrovascular disease Off aggrenox due to initiation of Eliquis  Hereditary spastic  paraparesis  Dyslipidemia LDL 55  Hypertension  BP controlled   Mild non-obstructive Coronary artery disease  Cardiac cath 2014 showed 40-50% diagonal disease and 30% LM, 30% LCx and mid 30% RCA - medical management was suggested at that time   DVT prophylaxis: Eliquis  Code Status: FULL CODE Family Communication: no family at bedside  Disposition Plan: awaiting TCD study - for CIR admit when bed available    Consultants:  Neurology  Antimicrobials:  none  Objective: Blood pressure (!) 145/65, pulse 78, temperature 97.8 F (36.6 C), temperature source Oral, resp. rate 18, height 5\' 2"  (1.575 m), weight 62 kg (136 lb 11 oz), SpO2 98 %.  Intake/Output Summary (Last 24 hours) at 01/27/2018 0835 Last data filed at 01/26/2018 1900 Gross per 24 hour  Intake 1080 ml  Output -  Net 1080 ml   Filed Weights   01/25/18 1511  Weight: 62 kg (136 lb 11 oz)    Examination: General: No acute respiratory distress - A&O x4 Lungs: CTA B w/o wheezing or crackles  Cardiovascular: RRR w/o R or M   Abdomen: NT/ND, soft, bs+, no mass  Extremities: No C/C/E B lower extremities   CBC: Recent Labs  Lab 01/24/18 1240  01/25/18 0012 01/26/18 0355 01/27/18 0412  WBC 7.6  --  6.6 6.1 6.7  NEUTROABS 5.6  --   --   --   --   HGB 10.9*   < > 10.5* 10.4* 10.3*  HCT 34.4*   < > 33.0* 32.6* 32.5*  MCV 96.1  --  95.1 95.6 94.5  PLT 179  --  173 165 166   < > = values in this interval not displayed.   Basic Metabolic Panel: Recent Labs  Lab 01/24/18 1240 01/24/18 1249 01/25/18 0012 01/26/18 0355  NA 138 139 140 139  K 3.9 3.9 4.2 4.7  CL 111 110 113* 112*  CO2 17*  --  19* 20*  GLUCOSE 167* 161* 127* 100*  BUN 21 20 20 20   CREATININE 1.17* 1.00 1.04* 0.99  CALCIUM 8.8*  --  8.7* 8.6*   GFR: Estimated Creatinine Clearance: 34 mL/min (by C-G formula based on SCr of 0.99 mg/dL).  Liver Function Tests: Recent Labs  Lab 01/24/18 1240 01/26/18 0355  AST 20 23  ALT 15 14   ALKPHOS 113 100  BILITOT 0.5 0.4  PROT 6.0* 5.3*  ALBUMIN 3.6 2.9*    Coagulation Profile: Recent Labs  Lab 01/24/18 1240  INR 1.20    Cardiac Enzymes: Recent Labs  Lab 01/24/18 1750 01/25/18 0012 01/25/18 0636 01/26/18 0905  TROPONINI 0.34* 0.44* 0.52* 0.36*    HbA1C: Hgb A1c MFr Bld  Date/Time Value Ref Range Status  01/25/2018 12:12 AM 5.8 (H) 4.8 - 5.6 % Final    Comment:    (NOTE) Pre diabetes:          5.7%-6.4% Diabetes:              >6.4% Glycemic control for   <7.0% adults with diabetes   03/25/2013 07:01 PM 6.1 (H) <5.7 % Final    Comment:    (NOTE)                                                                       According to the ADA Clinical Practice Recommendations for 2011, when HbA1c is used as a screening test:  >=6.5%   Diagnostic of Diabetes Mellitus           (if abnormal result is confirmed) 5.7-6.4%   Increased risk of developing Diabetes Mellitus References:Diagnosis and Classification of Diabetes Mellitus,Diabetes IPJA,2505,39(JQBHA 1):S62-S69 and Standards of Medical Care in         Diabetes - 2011,Diabetes Care,2011,34 (Suppl 1):S11-S61.    Scheduled Meds: . apixaban  10 mg Oral BID   Followed by  . [START ON 01/31/2018] apixaban  5 mg Oral BID  . atorvastatin  40 mg Oral QPM  . diclofenac sodium  2 g Topical QID  . docusate sodium  100 mg Oral BID  . fluticasone  2 spray Each Nare Daily  . lisinopril  40 mg Oral q morning - 10a  . meclizine  25 mg Oral BID  . megestrol  40 mg Oral TID  . pantoprazole  40 mg Oral Daily  . sodium chloride flush  3 mL Intravenous Q12H     LOS: 2 days   Cherene Altes, MD [facility] Office  [phone] Pager - Text Page per Amion  If 7PM-7AM, please contact night-coverage per Amion 01/27/2018, 8:35 AM

## 2018-01-27 NOTE — Care Management Note (Addendum)
Case Management Note  Patient Details  Name: Vicki Stein MRN: [id number] Date of Birth: May 15, 1928  Subjective/Objective:                    Action/Plan: CM submitted for benefits check for Eliquis. Results pending.  Pt is discharging to CIR today. CM signing off.    Expected Discharge Date:  01/27/18               Expected Discharge Plan:  [place]  In-House Referral:     Discharge planning Services  CM Consult  Post Acute Care Choice:    Choice offered to:     DME Arranged:    DME Agency:     HH Arranged:    HH Agency:     Status of Service:  Completed, signed off  If discussed at [employer], dates discussed:    Additional Comments:  Pollie Friar, RN 01/27/2018, 1:10 PM

## 2018-01-27 NOTE — H&P (Signed)
Physical Medicine and Rehabilitation Admission H&P    Chief Complaint  Patient presents with  . Stroke  . Blurred Vision and dizziness    HPI: Vicki Stein is an 84 year old female with history of hereditary spastic paraparesis, HTN, CVA with left sided weakness, endometrial cancer with recent addition of megace; who was admitted on 01/25/2018 with chest pain, blurred vision, dizziness as well as numbness and weakness of bilateral upper extremity.  History taken from chart review, neighbor, and patient. MRI of brain reviewed, showing b/l CVA.  Per report, multiple small acute to early subacute infarct cerebral and cerebellar hemispheres question watershed ischemia due to hypoperfusion.  Chest CT showed small right lower lobe PE and she was started on Eliquis for treatment.  2D echo showed EF of 55 to 60% with no wall abnormality, mildly calcified mitral valve and mildly increased pulmonary artery pressures.  Dr. Leonie Man felt that stroke likely embolic cardioembolic versus due to hypercoagulable state from cancer versus marantic endocarditis.  Patient currently with bilateral upper extremity weakness with poor safety awareness, instability and impulsivity with high risk for falls.  CIR was recommended due to functional decline.   Review of Systems  Constitutional: Negative for chills and fever.  HENT: Positive for hearing loss. Negative for tinnitus.   Eyes: Positive for blurred vision (blurry in morning but clears up). Negative for double vision.  Respiratory: Negative for cough and shortness of breath.   Cardiovascular: Negative for chest pain.  Gastrointestinal: Negative for constipation, heartburn and nausea.  Genitourinary: Negative for dysuria and urgency.  Musculoskeletal: Positive for falls (multiple in past few months).  Neurological: Positive for dizziness, focal weakness and weakness.  All other systems reviewed and are negative.   Past Medical History:  Diagnosis Date  .  Arthritis    all over  . Back pain   . Blood dyscrasia    on blood thiners  . Borderline diabetes   . Cerebrovascular disease   . Coronary artery disease    a. LHC (9/14):  pLM 30, oD1 50, D2 40-50, pRI 30, mCFX 30, mRCA 30, EF 65%  . GERD (gastroesophageal reflux disease)   . Gout   . Hemorrhoids   . High cholesterol   . History of kidney stones    60 years ago  . Hyperlipemia   . Hypertension   . Migraines   . Movement disorder   . Paraplegia, spastic congenital ([place])    inherited .   Marland Kitchen Peripheral vascular disease (HCC)    poor circulation lower legs  . Pneumonia    30 years ago  . Pre-diabetes    borderline  . Reflux   . Stroke [facility])    " 5 mini strokes"    Past Surgical History:  Procedure Laterality Date  . APPENDECTOMY    . CATARACT EXTRACTION W/ INTRAOCULAR LENS  IMPLANT, BILATERAL    . CHOLECYSTECTOMY    . COLONOSCOPY    . DILATATION & CURETTAGE/HYSTEROSCOPY WITH MYOSURE N/A 06/25/2017   Procedure: DILATATION & CURETTAGE/HYSTEROSCOPY;  Surgeon: Janyth Pupa, DO;  Location: [facility];  Service: Gynecology;  Laterality: N/A;  . DILATION AND CURETTAGE OF UTERUS    . ESOPHAGOGASTRODUODENOSCOPY N/A 11/14/2015   Procedure: ESOPHAGOGASTRODUODENOSCOPY (EGD);  Surgeon: Laurence Spates, MD;  Location: [facility] ENDOSCOPY;  Service: Endoscopy;  Laterality: N/A;  . HEMORRHOID SURGERY    . LEFT HEART CATHETERIZATION WITH CORONARY ANGIOGRAM N/A 03/25/2013   Procedure: LEFT HEART CATHETERIZATION WITH CORONARY ANGIOGRAM;  Surgeon: Harrell Gave  Santina Evans, MD;  Location: [facility]ATH [facility];  Service: Cardiovascular;  Laterality: N/A;  . ROTATOR CUFF REPAIR    . TONSILLECTOMY    . TUBAL LIGATION      Family History  Problem Relation Age of Onset  . Heart Problems Mother   . Heart Problems Father   . Heart attack Son     Social History:  Lives alone and independent with RW prior to admission. She reports that she has never smoked. She has never used smokeless tobacco. She reports that she  does not drink alcohol or use drugs.    Allergies  Allergen Reactions  . Aleve [Naproxen Sodium] Itching and Swelling  . Bactrim [Sulfamethoxazole-Trimethoprim] Itching and Other (See Comments)    Had a bad reaction, can't remember, worked me over  . Motrin [Ibuprofen] Itching and Swelling  . Penicillins Hives, Itching and Other (See Comments)    Has patient had a PCN reaction causing immediate rash, facial/tongue/throat swelling, SOB or lightheadedness with hypotension: No Has patient had a PCN reaction causing severe rash involving mucus membranes or skin necrosis: No Has patient had a PCN reaction that required hospitalization No Has patient had a PCN reaction occurring within the last 10 years: No If all of the above answers are "NO", then may proceed with Cephalosporin use.  Marland Kitchen Ultram [Tramadol] Other (See Comments)    Itvhing and tongue swelling.    Medications Prior to Admission  Medication Sig Dispense Refill  . acetaminophen (TYLENOL) 500 MG tablet Take 500 mg by mouth every 6 (six) hours as needed for mild pain or moderate pain.    Marland Kitchen acetaminophen-codeine (TYLENOL #3) 300-30 MG tablet Take 1 tablet by mouth every 8 (eight) hours.    Marland Kitchen amLODipine (NORVASC) 2.5 MG tablet Take 2.5 mg by mouth See admin instructions. Take 1 tablet by mouth twice daily as needed for a blood pressure 150-90 2 hours after taking lisinopril    . atorvastatin (LIPITOR) 40 MG tablet Take 40 mg by mouth every evening.     . diclofenac sodium (VOLTAREN) 1 % GEL Apply 2 g topically 4 (four) times daily. Apply to knees, ankles, and hands, for arthritis     . diphenhydrAMINE (BENADRYL) 25 MG tablet Take 25 mg by mouth every 6 (six) hours as needed for allergies.    Marland Kitchen dipyridamole-aspirin (AGGRENOX) 200-25 MG per 12 hr capsule Take 1 capsule by mouth 2 (two) times daily.     . fluticasone (FLONASE) 50 MCG/ACT nasal spray Place 2 sprays into both nostrils daily.     Marland Kitchen lisinopril (PRINIVIL,ZESTRIL) 40 MG tablet  Take 40 mg by mouth every morning.     . meclizine (ANTIVERT) 25 MG tablet Take 25 mg by mouth 2 (two) times daily.    . megestrol (MEGACE) 40 MG tablet Take 1 tablet (40 mg total) by mouth 3 (three) times daily. 90 tablet 3  . omeprazole (PRILOSEC) 40 MG capsule Take 40 mg by mouth at bedtime.     . ondansetron (ZOFRAN-ODT) 4 MG disintegrating tablet Take 4 mg by mouth 3 (three) times daily as needed for nausea or vomiting.      Drug Regimen Review  Drug regimen was reviewed and remains appropriate with no significant issues identified  Home: Home Living Family/patient expects to be discharged to:: Private residence Living Arrangements: Alone Available Help at Discharge: Family, Available PRN/intermittently Type of Home: House Home Access: Stairs to enter [employer] of Steps: 4 Entrance Stairs-Rails: Right Home Layout: One  level Bathroom Shower/Tub: Multimedia programmer: Standard Home Equipment: Environmental consultant - 2 wheels, Shower seat, Adaptive equipment, Bedside commode(3 wheeled walker) Additional Comments: has walker outside shower to assist with steadying  Lives With: Alone   Functional History: Prior Function Level of Independence: Independent with assistive device(s) Comments: Pt performs all ADLs and IADLs in home. Uses AE for LB dressing. uses RW all the time; neighbor does grocery shopping and driving  Functional Status:  Mobility: Bed Mobility Overal bed mobility: Needs Assistance Bed Mobility: Supine to Sit Supine to sit: Min guard General bed mobility comments: OOB in recliner upon arrival Transfers Overall transfer level: Needs assistance Equipment used: Rolling walker (2 wheeled) Transfers: Sit to/from Stand Sit to Stand: Min assist General transfer comment: cues for safe hand placement and assist to steady Ambulation/Gait Ambulation/Gait assistance: Min assist, +2 safety/equipment(chair follow) Gait Distance (Feet): 75 Feet Assistive device:  Rolling walker (2 wheeled) Gait Pattern/deviations: Step-to pattern, Decreased step length - left, Decreased stride length, Decreased dorsiflexion - right, Decreased dorsiflexion - left, Trunk flexed, Wide base of support General Gait Details: pt with limited bilat LE flexion with gait and grossly maintains hip/knee extension; cues for gait velocity and safe use of AD; assistance to steady and guide Rw at times; pt required rest break due to SOB and fatigue Gait velocity: decreased Stairs: Yes Stairs assistance: Min assist Stair Management: Two rails, Step to pattern, Forwards Number of Stairs: 2 General stair comments: cues for safety with foot placement; assistance for balance; pt reliant on bilat UE support; pt with more difficulty descending steps due to limited bilat knee ROM     ADL: ADL Overall ADL's : Needs assistance/impaired Eating/Feeding: Set up, Sitting Grooming: Oral care, Standing Grooming Details (indicate cue type and reason): Min A for standing balance and safety at sink Upper Body Bathing: Sitting, Set up, Supervision/ safety Lower Body Bathing: Minimal assistance, Sit to/from stand Upper Body Dressing : Set up, Supervision/safety, Sitting Lower Body Dressing: Minimal assistance, Sit to/from stand Lower Body Dressing Details (indicate cue type and reason): Pt with increased difficulty bringing ankles to knees due to tightness.  Toilet Transfer: Minimal assistance, Cueing for safety, Ambulation(simulated ) Toilet Transfer Details (indicate cue type and reason): Min A for safety and requiring cues not to sit prematurely Functional mobility during ADLs: Minimal assistance, +2 for safety/equipment, Rolling walker, Cueing for safety General ADL Comments: Pt highly unsafe at this time. She is very motivated to complete tasks independently.   Cognition: Cognition Overall Cognitive Status: Impaired/Different from baseline Arousal/Alertness: Awake/alert Orientation Level:  Oriented X4 Attention: Selective Selective Attention: Appears intact Memory: Appears intact Awareness: Impaired Awareness Impairment: Anticipatory impairment Problem Solving: Appears intact Behaviors: Impulsive, Restless(she describes this as "being spunky" at baseline) Safety/Judgment: Impaired Cognition Arousal/Alertness: Awake/alert Behavior During Therapy: WFL for tasks assessed/performed, Impulsive Overall Cognitive Status: Impaired/Different from baseline Area of Impairment: Attention, Memory, Following commands, Safety/judgement, Awareness Current Attention Level: Selective Memory: Decreased recall of precautions Following Commands: Follows one step commands inconsistently, Follows multi-step commands inconsistently Safety/Judgement: Decreased awareness of safety, Decreased awareness of deficits General Comments: Pt tangential with all activities. She is hard of hearing impacting her ability to follow directions at times. Poor safety awareness noted and decreased awareness in general.    Blood pressure (!) 118/56, pulse 78, temperature 98.3 F (36.8 C), temperature source Oral, resp. rate 20, height '5\' 2"'  (1.575 m), weight 62 kg (136 lb 11 oz), SpO2 99 %. Physical Exam  Nursing note and vitals reviewed. Constitutional:  She appears well-developed and well-nourished.  HENT:  Head: Normocephalic and atraumatic.  Eyes: EOM are normal. Right eye exhibits no discharge. Left eye exhibits no discharge.  Neck: Normal range of motion. Neck supple.  Cardiovascular: Normal rate and regular rhythm.  Respiratory: Effort normal and breath sounds normal.  GI: Soft. Bowel sounds are normal.  Musculoskeletal:  RLE longer due to leg length discrepancy.  No edema or tenderness in extremities  Neurological: She is alert.  Speech clear.  HOH Able to follow basic commands with cues to attend to task.  BLE with extensor tone and decrease in fine motor control BUE.  Motor: B/l UE 4-4+/5  proximal to distal B/ol LE: HF 3-/4, KE 3/5, ADF 4-/5 (right stronger than left)  Skin: Skin is warm and dry.  Psychiatric: She has a normal mood and affect. Her behavior is normal.    Results for orders placed or performed during the hospital encounter of 01/24/18 (from the past 48 hour(s))  Glucose, capillary     Status: Abnormal   Collection Time: 01/25/18  9:41 PM  Result Value Ref Range   Glucose-Capillary 143 (H) 70 - 99 mg/dL  CBC     Status: Abnormal   Collection Time: 01/26/18  3:55 AM  Result Value Ref Range   WBC 6.1 4.0 - 10.5 K/uL   RBC 3.41 (L) 3.87 - 5.11 MIL/uL   Hemoglobin 10.4 (L) 12.0 - 15.0 g/dL   HCT 32.6 (L) 36.0 - 46.0 %   MCV 95.6 78.0 - 100.0 fL   MCH 30.5 26.0 - 34.0 pg   MCHC 31.9 30.0 - 36.0 g/dL   RDW 13.7 11.5 - 15.5 %   Platelets 165 150 - 400 K/uL    Comment: Performed at [facility], [place] [address], [place], [place] [address]  Comprehensive metabolic panel     Status: Abnormal   Collection Time: 01/26/18  3:55 AM  Result Value Ref Range   Sodium 139 135 - 145 mmol/L   Potassium 4.7 3.5 - 5.1 mmol/L   Chloride 112 (H) 98 - 111 mmol/L    Comment: Please note change in reference range.   CO2 20 (L) 22 - 32 mmol/L   Glucose, Bld 100 (H) 70 - 99 mg/dL    Comment: Please note change in reference range.   BUN 20 8 - 23 mg/dL    Comment: Please note change in reference range.   Creatinine, Ser 0.99 0.44 - 1.00 mg/dL   Calcium 8.6 (L) 8.9 - 10.3 mg/dL   Total Protein 5.3 (L) 6.5 - 8.1 g/dL   Albumin 2.9 (L) 3.5 - 5.0 g/dL   AST 23 15 - 41 U/L   ALT 14 0 - 44 U/L    Comment: Please note change in reference range.   Alkaline Phosphatase 100 38 - 126 U/L   Total Bilirubin 0.4 0.3 - 1.2 mg/dL   GFR calc non Af Amer 49 (L) >60 mL/min   GFR calc Af Amer 57 (L) >60 mL/min    Comment: (NOTE) The eGFR has been calculated using the CKD EPI equation. This calculation has not been validated in all clinical situations. eGFR's persistently <60 mL/min  signify possible Chronic Kidney Disease.    Anion gap 7 5 - 15    Comment: Performed at [facility] [address], [place], [place] [address]  Glucose, capillary     Status: None   Collection Time: 01/26/18  6:35 AM  Result Value  Ref Range   Glucose-Capillary 97 70 - 99 mg/dL  Troponin I     Status: Abnormal   Collection Time: 01/26/18  9:05 AM  Result Value Ref Range   Troponin I 0.36 (HH) <0.03 ng/mL    Comment: CRITICAL VALUE NOTED.  VALUE IS CONSISTENT WITH PREVIOUSLY REPORTED AND CALLED VALUE. Performed at [facility], [place] [address] [place], [place] [address]   CBC     Status: Abnormal   Collection Time: 01/27/18  4:12 AM  Result Value Ref Range   WBC 6.7 4.0 - 10.5 K/uL   RBC 3.44 (L) 3.87 - 5.11 MIL/uL   Hemoglobin 10.3 (L) 12.0 - 15.0 g/dL   HCT 32.5 (L) 36.0 - 46.0 %   MCV 94.5 78.0 - 100.0 fL   MCH 29.9 26.0 - 34.0 pg   MCHC 31.7 30.0 - 36.0 g/dL   RDW 13.6 11.5 - 15.5 %   Platelets 166 150 - 400 K/uL    Comment: Performed at [facility], [place] [address], [place],  [address]   No results found.  Medical Problem List and Plan: 1.  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  2.  DVT Prophylaxis/Anticoagulation: Pharmaceutical: Other (comment)--Eliquis 3. Pain Management: N/A 4. Mood: LCSW to follow for evaluation and support.  5. Neuropsych: This patient is capable of making decisions on her own behalf. 6. Skin/Wound Care: routine pressure relief measures.  7. Fluids/Electrolytes/Nutrition: Monitor I/O. Check lytes in am. 8. HTN: Monitor BP bid. Continue Lisinopril daily 9. Endometrial cancer: Treated non surgically--on megace tid.  10. Anemia: Continue to monitor for stability.   Post Admission Physician Evaluation: 1. Preadmission assessment reviewed and changes made below. 2. Functional deficits  secondary  to bilateral CVA. 3. Patient is admitted to receive collaborative, interdisciplinary care between the physiatrist, rehab nursing staff, and therapy team. 4. Patient's level of medical complexity and substantial therapy needs in context of that medical necessity cannot be provided at a lesser intensity of care such as a SNF. 5. Patient has experienced substantial functional loss from his/her baseline which was documented above under the "Functional History" and "Functional Status" headings.  Judging by the patient's diagnosis, physical exam, and functional history, the patient has potential for functional progress which will result in measurable gains while on inpatient rehab.  These gains will be of substantial and practical use upon discharge  in facilitating mobility and self-care at the household level. 24. Physiatrist will provide 24 hour management of medical needs as well as oversight of the therapy plan/treatment and provide guidance as appropriate regarding the interaction of the two. 7. 24 hour rehab nursing will assist with safety, disease management and patient education  and help integrate therapy concepts, techniques,education, etc. 8. PT will assess and treat for/with: Lower extremity strength, range of motion, stamina, balance, functional mobility, safety, adaptive techniques and equipment, wound care, coping skills, pain control, stroke education. Goals are: Supervisoin. 9. OT will assess and treat for/with: ADL's, functional mobility, safety, upper extremity strength, adaptive techniques and equipment, wound mgt, ego support, and community reintegration.   Goals are: Supervision. Therapy may proceed with showering this patient. 10. Case Management and Social Worker will assess and treat for psychological issues and discharge planning. 11. Team conference will be held weekly to assess progress toward goals and to determine barriers to discharge. 12. Patient will receive at least  3  hours of therapy per day at least 5 days per week. 13. ELOS: 7-10 days.       14. Prognosis:  good  I have personally performed a face to face diagnostic evaluation, including, but not limited to relevant history and physical exam findings, of this patient and developed relevant assessment and plan.  Additionally, I have reviewed and concur with the physician assistant's documentation above.  Delice Lesch, MD, ABPMR Bary Leriche, PA-C 01/27/2018

## 2018-01-27 NOTE — Progress Notes (Signed)
Inpatient Rehabilitation  I have received acute medical clearance and have a bed to offer today.  Discussed it with patient and son, Wille Glaser all are in agreement with plan.  Plan to proceed with IP Rehab admission.  Updated team.  Call if questions.   Carmelia Roller., CCC/SLP Admission Coordinator  [place]  Cell [phone]

## 2018-01-27 NOTE — Progress Notes (Signed)
Physical Therapy Treatment Patient Details Name: Vicki Stein MRN: [id number] DOB: 1928/06/22 Today's Date: 01/27/2018    History of Present Illness 84 y.o. female who presented to the [facility] ED with blurred vision in conjunction with intermittent numbness and alternating weakness to bilateral upper extremities. PMH including CAD, GERD, HTN, paraplegia (spastic congenital), PVD, and "5 mini strokes".  MRI revealing small acute/subacute infarcts scattered throughout both cerebral and cerebellar hemispheres, possibly watershed ischemia.    PT Comments    Patient is making progress toward PT goals. Pt with increased gait deviations and ability to advance bilat LE with increased distance. Continue to recommend CIR for further skilled PT services to maximize independence and safety with mobility.    Follow Up Recommendations  CIR;Supervision/Assistance - 24 hour     Equipment Recommendations  None recommended by PT    Recommendations for Other Services OT consult;Rehab consult     Precautions / Restrictions Precautions Precautions: Fall Restrictions Weight Bearing Restrictions: No    Mobility  Bed Mobility Overal bed mobility: Needs Assistance Bed Mobility: Supine to Sit     Supine to sit: Min guard     General bed mobility comments: use of rail and HOB elevated slightly; increased time and effort   Transfers Overall transfer level: Needs assistance Equipment used: Rolling walker (2 wheeled) Transfers: Sit to/from Stand Sit to Stand: Min assist         General transfer comment: cues for safe hand placement and assist to steady  Ambulation/Gait Ambulation/Gait assistance: Min assist;Mod assist Gait Distance (Feet): 100 Feet Assistive device: Rolling walker (2 wheeled) Gait Pattern/deviations: Step-to pattern;Decreased step length - left;Decreased stride length;Decreased dorsiflexion - right;Decreased dorsiflexion - left;Trunk flexed;Wide base of support Gait velocity:  decreased   General Gait Details: pt with increasing difficulty with distance and requires increased assistance for safety; pt with difficulty clearing feet last ~23ft    Stairs             Wheelchair Mobility    Modified Rankin (Stroke Patients Only)       Balance Overall balance assessment: Needs assistance Sitting-balance support: No upper extremity supported;Feet supported Sitting balance-Leahy Scale: Good     Standing balance support: Bilateral upper extremity supported;During functional activity Standing balance-Leahy Scale: Poor                              Cognition Arousal/Alertness: Awake/alert Behavior During Therapy: WFL for tasks assessed/performed;Impulsive Overall Cognitive Status: Impaired/Different from baseline Area of Impairment: Attention;Memory;Following commands;Safety/judgement;Awareness                   Current Attention Level: Selective Memory: Decreased recall of precautions Following Commands: Follows multi-step commands inconsistently Safety/Judgement: Decreased awareness of safety;Decreased awareness of deficits            Exercises      General Comments        Pertinent Vitals/Pain Pain Assessment: No/denies pain    Home Living                      Prior Function            PT Goals (current goals can now be found in the care plan section) Progress towards PT goals: Progressing toward goals    Frequency    Min 4X/week      PT Plan Current plan remains appropriate    Co-evaluation  AM-PAC PT "6 Clicks" Daily Activity  Outcome Measure  Difficulty turning over in bed (including adjusting bedclothes, sheets and blankets)?: A Little Difficulty moving from lying on back to sitting on the side of the bed? : A Little Difficulty sitting down on and standing up from a chair with arms (e.g., wheelchair, bedside commode, etc,.)?: Unable Help needed moving to and from a  bed to chair (including a wheelchair)?: A Little Help needed walking in hospital room?: A Little Help needed climbing 3-5 steps with a railing? : A Lot 6 Click Score: 15    End of Session Equipment Utilized During Treatment: Gait belt Activity Tolerance: Patient tolerated treatment well Patient left: in chair;with call bell/phone within reach;with chair alarm set Nurse Communication: Mobility status PT Visit Diagnosis: Unsteadiness on feet (R26.81);Other abnormalities of gait and mobility (R26.89);Muscle weakness (generalized) (M62.81)     Time: 5188-4166 PT Time Calculation (min) (ACUTE ONLY): 29 min  Charges:  $Gait Training: 8-22 mins $Therapeutic Activity: 8-22 mins                    G Codes:       Earney Navy, PTA Pager: [phone]     Darliss Cheney 01/27/2018, 1:15 PM

## 2018-01-27 NOTE — Progress Notes (Signed)
Patient ID: Vicki Stein, female   DOB: 1927/12/09, 84 y.o.   MRN: [id number] Patient arrived from [facility] with RN and patient belongings. Oriented to room, rehab process, schedule, fall prevention plan, safety plan, and health resource notebook with verbal understanding. Patient resting comfortably in bed with call bell at side.

## 2018-01-27 NOTE — Progress Notes (Signed)
IV and telemetry removed. Pt transported to 4w17 per staff in bed

## 2018-01-27 NOTE — Consult Note (Signed)
[facility] CM Primary Care Navigator  01/27/2018  Vicki Stein 13-Nov-1927 [id number]   Met with patient, granddaughter [name]) and her husband [name]) at the bedside toidentify possible discharge needs.  Patient reports having "persistent blurred vision and light headedness" that had led to this admission. (MRI of the brain noted acute CVAs).  Patient endorsesDr. Rikki Spearing with [place] [facility] as theprimary care provider.   [facility] in [place] to obtain medications without difficulty.  Patient has beenmanaginghermedications at home, with use of "pill box" system filled every 2 weeks.  Patient's children (sons- [name] and [name]) or neighbor [name]) have beenprovidingtransportation to her doctors' appointments.  Patient's sons are able to provide assistance with her needs at home as stated.  [facility].  Patientvoiced understandingto callprimary care provider'soffice whenshe returns backhome,for a post discharge follow-upvisitwithin1- 2 weeksor sooner if needs arise.Patient letter (with PCP's contact number) was providedasareminder.   Explained topatientregarding [facility] CM services available for health management/ resourcesat homebut she denies any needs or concerns at this time and states that she is capable of managing her health issues and able to do it so far with sons assistance. Patient verbalizedunderstandingof needto seekreferral from primary care provider to [facility] care management ifdeemed necessary and appropriatefor anyservicesin the future-once she is dischargeback home.   [facility] care management information was provided for futureneeds thatshemay have.  Primary care provider's office is listed as providing transition of care (TOC) follow-up.   For additional questions please contact:  Edwena Felty A.  Amil Moseman, BSN, RN-BC [facility] PRIMARY CARE Navigator Cell: [phone]

## 2018-01-27 NOTE — Progress Notes (Signed)
Gave report to RN 4w

## 2018-01-27 NOTE — Discharge Summary (Signed)
DISCHARGE SUMMARY  LEXANI CORONA  MR#: [id number]  DOB:[birth year]  Date of Admission: 01/24/2018 Date of Discharge: 01/27/2018  Attending Physician:Renny Remer Hennie Duos, MD  Patient's PCP:Le, Tilden Fossa, DO  Consults:  Neurology  Disposition: D/C to CIR   Follow-up Appts: to be determined at time of D/C from CIR   Discharge Diagnoses: Small subsegmental pulmonary emboli Multifocal cortical infarctions bilaterally + bilateral cerebellar infarctions Modestly elevated troponin Endometrial adenocarcinoma Prior hx of Cerebrovascular disease Hereditary spastic paraparesis Dyslipidemia Hypertension  Mild non-obstructive Coronary artery disease   Initial presentation: 84 y.o.F w/ a hx of endometrial adenocarcinoma, CAD, HTN, PVD, prior stroke, and spastic congenital paraplegia who presented w/ 1 day of B blurry vision, numbness that alternates between both arms, vertigo, and generalized weakness. She was started on Megace the week prior for her cancer.   In the ED a CTa chest noted small pulmonary emboli incidentally.  After admit, an MRI of the brain noted acute B small CVAs  Hospital Course:  Small subsegmental pulmonary emboli Very low clot burden - cont Eliquis - given concomitant embolic CVA shower will need to remain on anticoag lifelong   Multifocal cortical infarctions bilaterally + bilateral cerebellar infarctions suspicious for cardioembolic etiology - workup directed by Neurology - no indication for TEE as pt will require lifelong anticoag regardless - TCD not able to be accomplished due to poor windows   Modestly elevated troponin likely enzyme leak related to small subsegmental pulmonary emboli - recheck confirmed trending down/normalizing - no chest pain   Endometrial adenocarcinoma Recently her Mirena IUD was removed - is on Megace - pain meds adjusted 7/22 - to f/u w/ her outpt tx team   Prior hx of Cerebrovascular disease Off aggrenox due to initiation of  Eliquis  Hereditary spastic paraparesis  Dyslipidemia LDL 55  Hypertension  BP controlled   Mild non-obstructive Coronary artery disease  Cardiac cath 2014 showed 40-50% diagonal disease and 30% LM, 30% LCx and mid 30% RCA - medical management was suggested at that time   Meds at time of D/C to CIR:  Current Facility-Administered Medications:  .  acetaminophen (TYLENOL) tablet 650 mg, 650 mg, Oral, Q6H PRN **OR** [DISCONTINUED] acetaminophen (TYLENOL) suppository 650 mg, 650 mg, Rectal, Q6H PRN, Lady Deutscher, MD .  acetaminophen-codeine (TYLENOL #3) 300-30 MG per tablet 1-2 tablet, 1-2 tablet, Oral, Q4H PRN, Cherene Altes, MD, 2 tablet at 01/27/18 [phone] .  amLODipine (NORVASC) tablet 2.5 mg, 2.5 mg, Oral, BID PRN, Lady Deutscher, MD .  apixaban (ELIQUIS) tablet 10 mg, 10 mg, Oral, BID, 10 mg at 01/27/18 0931 **FOLLOWED BY** [START ON 01/31/2018] apixaban (ELIQUIS) tablet 5 mg, 5 mg, Oral, BID, Evangeline Gula, Wyatt Haste, MD .  atorvastatin (LIPITOR) tablet 40 mg, 40 mg, Oral, QPM, Lady Deutscher, MD, 40 mg at 01/26/18 1721 .  diclofenac sodium (VOLTAREN) 1 % transdermal gel 2 g, 2 g, Topical, QID, Evangeline Gula, Wyatt Haste, MD, 2 g at 01/27/18 0935 .  docusate sodium (COLACE) capsule 100 mg, 100 mg, Oral, BID, Lady Deutscher, MD, 100 mg at 01/27/18 0930 .  fluticasone (FLONASE) 50 MCG/ACT nasal spray 2 spray, 2 spray, Each Nare, Daily, Lady Deutscher, MD, 2 spray at 01/27/18 0934 .  lisinopril (PRINIVIL,ZESTRIL) tablet 40 mg, 40 mg, Oral, q morning - 10a, Lady Deutscher, MD, 40 mg at 01/27/18 0932 .  meclizine (ANTIVERT) tablet 25 mg, 25 mg, Oral, BID, Lady Deutscher, MD, 25 mg at 01/27/18 1191 .  megestrol (  MEGACE) tablet 40 mg, 40 mg, Oral, TID, Cherene Altes, MD, 40 mg at 01/27/18 0934 .  ondansetron (ZOFRAN) tablet 4 mg, 4 mg, Oral, Q6H PRN **OR** ondansetron (ZOFRAN) injection 4 mg, 4 mg, Intravenous, Q6H PRN, Lady Deutscher, MD .  oxyCODONE (Oxy  IR/ROXICODONE) immediate release tablet 5 mg, 5 mg, Oral, Q4H PRN, Cherene Altes, MD .  pantoprazole (PROTONIX) EC tablet 40 mg, 40 mg, Oral, Daily, Lady Deutscher, MD, 40 mg at 01/27/18 0931 .  sodium chloride flush (NS) 0.9 % injection 3 mL, 3 mL, Intravenous, Q12H, Evangeline Gula, Wyatt Haste, MD, 3 mL at 01/27/18 0936  Day of Discharge BP (!) 118/56 (BP Location: Left Arm)   Pulse 78   Temp 98.3 F (36.8 C) (Oral)   Resp 20   Ht 5\' 2"  (1.575 m)   Wt 62 kg (136 lb 11 oz)   SpO2 99%   BMI 25.00 kg/m   Physical Exam: General: No acute respiratory distress - A&O x4 Lungs: CTA B w/o wheezing or crackles  Cardiovascular: RRR w/o R or M   Abdomen: NT/ND, soft, bs+, no mass  Extremities: No C/C/E B lower extremities   Basic Metabolic Panel: Recent Labs  Lab 01/24/18 1240 01/24/18 1249 01/25/18 0012 01/26/18 0355  NA 138 139 140 139  K 3.9 3.9 4.2 4.7  CL 111 110 113* 112*  CO2 17*  --  19* 20*  GLUCOSE 167* 161* 127* 100*  BUN 21 20 20 20   CREATININE 1.17* 1.00 1.04* 0.99  CALCIUM 8.8*  --  8.7* 8.6*    Liver Function Tests: Recent Labs  Lab 01/24/18 1240 01/26/18 0355  AST 20 23  ALT 15 14  ALKPHOS 113 100  BILITOT 0.5 0.4  PROT 6.0* 5.3*  ALBUMIN 3.6 2.9*    Coags: Recent Labs  Lab 01/24/18 1240  INR 1.20   CBC: Recent Labs  Lab 01/24/18 1240 01/24/18 1249 01/25/18 0012 01/26/18 0355 01/27/18 0412  WBC 7.6  --  6.6 6.1 6.7  NEUTROABS 5.6  --   --   --   --   HGB 10.9* 10.9* 10.5* 10.4* 10.3*  HCT 34.4* 32.0* 33.0* 32.6* 32.5*  MCV 96.1  --  95.1 95.6 94.5  PLT 179  --  173 165 166    Cardiac Enzymes: Recent Labs  Lab 01/24/18 1750 01/25/18 0012 01/25/18 0636 01/26/18 0905  TROPONINI 0.34* 0.44* 0.52* 0.36*    CBG: Recent Labs  Lab 01/25/18 2141 01/26/18 0635  GLUCAP 143* 97   Time spent in discharge (includes decision making & examination of pt): 35 minutes  01/27/2018, 10:40 AM   Cherene Altes, MD [facility] Office  [phone] Pager [phone]  On-Call/Text Page:      [email or web address]      password [facility]

## 2018-01-27 NOTE — PMR Pre-admission (Addendum)
PMR Admission Coordinator Pre-Admission Assessment  Patient: Vicki Stein is an 84 y.o., female MRN: [id number] DOB: 1927/09/27 Height: 5\' 2"  (157.5 cm) Weight: 62 kg (136 lb 11 oz)              Insurance Information HMO:     PPO:      PCP:      IPA:      80/20:     OTHER:  PRIMARY: Medicare A & B      Policy#: [id number]      Subscriber: Self Pre-Cert#: Eligible via [employer] Portal       Employer: Retired  Benefits:  Phone #: Verified online     Name: [employer] Portal  Eff. Date: 09/06/1994     Deduct: $1364      Out of Pocket Max: N/A      Life Max: N/A CIR: 100%      SNF: 100% days 1-20; 80% days 21-100 Outpatient: 80%     Co-Pay: 20% Home Health: 100%      Co-Pay: $0 DME: 80%     Co-Pay: 20% Providers: Patient's Choice   SECONDARY: GEHA      Policy#: [id number] geha      Subscriber: Spouse Employer: Retired Benefits:  Phone #: [phone]       Emergency [place]    Name Relation Home Work Mobile   [place] Son [phone]       Current Medical History  Patient Admitting Diagnosis: bilateral CVA  History of Present Illness: Vicki Stein is an 84 year old female with history of hereditary spastic paraparesis, HTN, CVA with left sided weakness, endometrial cancer with recent addition of megace; who was admitted on 01/25/2018 with chest pain, blurred vision, dizziness as well as numbness and weakness of bilateral upper extremity.  History taken from chart review, neighbor, and patient. MRI of brain reviewed, showing bilateral CVAs.  Per report, multiple small acute to early subacute infarct cerebral and cerebellar hemispheres question watershed ischemia due to hypoperfusion.  Chest CT showed small right lower lobe PE and she was started on Eliquis for treatment.  2D echo showed EF of 55 to 60% with no wall abnormality, mildly calcified mitral valve and mildly increased pulmonary artery pressures.  Dr. Leonie Man felt that stroke likely embolic  cardioembolic versus due to hypercoagulable state from cancer versus marantic endocarditis.  Patient currently with bilateral upper extremity weakness with poor safety awareness, instability, and impulsivity with high risk for falls.  CIR was recommended due to functional decline and patient admitted 01/27/18.     Past Medical History  Past Medical History:  Diagnosis Date  . Arthritis    all over  . Back pain   . Blood dyscrasia    on blood thiners  . Borderline diabetes   . Cerebrovascular disease   . Coronary artery disease    a. LHC (9/14):  pLM 30, oD1 50, D2 40-50, pRI 30, mCFX 30, mRCA 30, EF 65%  . GERD (gastroesophageal reflux disease)   . Gout   . Hemorrhoids   . High cholesterol   . History of kidney stones    60 years ago  . Hyperlipemia   . Hypertension   . Migraines   . Movement disorder   . 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   [facility] Peripheral vascular disease (HCC)    poor circulation lower legs  . Pneumonia    30 years ago  . Pre-diabetes  borderline  . Reflux   . Stroke [facility])    " 5 mini strokes"    Family History  family history includes Heart Problems in her father and mother; Heart attack in her son.  Prior Rehab/Hospitalizations:  Has the patient had major surgery during 100 days prior to admission? No  Current Medications   Current Facility-Administered Medications:  .  acetaminophen (TYLENOL) tablet 650 mg, 650 mg, Oral, Q6H PRN **OR** [DISCONTINUED] acetaminophen (TYLENOL) suppository 650 mg, 650 mg, Rectal, Q6H PRN, Lady Deutscher, MD .  acetaminophen-codeine (TYLENOL #3) 300-30 MG per tablet 1-2 tablet, 1-2 tablet, Oral, Q4H PRN, Cherene Altes, MD, 2 tablet at 01/27/18 [phone] .  amLODipine (NORVASC) tablet 2.5 mg, 2.5 mg, Oral, BID PRN, Lady Deutscher, MD .  apixaban (ELIQUIS) tablet 10 mg, 10 mg, Oral, BID, 10 mg at 01/27/18 0931 **FOLLOWED BY** [START ON 01/31/2018] apixaban (ELIQUIS) tablet 5 mg, 5 mg, Oral, BID,  Evangeline Gula, Wyatt Haste, MD .  atorvastatin (LIPITOR) tablet 40 mg, 40 mg, Oral, QPM, Lady Deutscher, MD, 40 mg at 01/26/18 1721 .  diclofenac sodium (VOLTAREN) 1 % transdermal gel 2 g, 2 g, Topical, QID, Evangeline Gula, Wyatt Haste, MD, 2 g at 01/27/18 1403 .  docusate sodium (COLACE) capsule 100 mg, 100 mg, Oral, BID, Lady Deutscher, MD, 100 mg at 01/27/18 0930 .  fluticasone (FLONASE) 50 MCG/ACT nasal spray 2 spray, 2 spray, Each Nare, Daily, Lady Deutscher, MD, 2 spray at 01/27/18 0934 .  lisinopril (PRINIVIL,ZESTRIL) tablet 40 mg, 40 mg, Oral, q morning - 10a, Lady Deutscher, MD, 40 mg at 01/27/18 0932 .  meclizine (ANTIVERT) tablet 25 mg, 25 mg, Oral, BID, Lady Deutscher, MD, 25 mg at 01/27/18 7017 .  megestrol (MEGACE) tablet 40 mg, 40 mg, Oral, TID, Cherene Altes, MD, 40 mg at 01/27/18 0934 .  ondansetron (ZOFRAN) tablet 4 mg, 4 mg, Oral, Q6H PRN **OR** ondansetron (ZOFRAN) injection 4 mg, 4 mg, Intravenous, Q6H PRN, Lady Deutscher, MD .  oxyCODONE (Oxy IR/ROXICODONE) immediate release tablet 5 mg, 5 mg, Oral, Q4H PRN, Cherene Altes, MD .  pantoprazole (PROTONIX) EC tablet 40 mg, 40 mg, Oral, Daily, Lady Deutscher, MD, 40 mg at 01/27/18 0931 .  sodium chloride flush (NS) 0.9 % injection 3 mL, 3 mL, Intravenous, Q12H, Lady Deutscher, MD, 3 mL at 01/27/18 7939  Patients Current Diet:  Diet Order           Diet Heart Room service appropriate? Yes; Fluid consistency: Thin  Diet effective now          Precautions / Restrictions Precautions Precautions: Fall Restrictions Weight Bearing Restrictions: No   Has the patient had 2 or more falls or a fall with injury in the past year?Yes, 2 falls without injuries while patient was vacuuming and in the bathroom, but she reports they were both prior to her using a walker.  Prior Activity Level Household: Patient was mostly household prior to admission and had given up driving about 1.5 years ago.  She maintained  her independence, which is important to her but got assist with some household and yard tasks and allowed her neighbor to drive her when she needs to run errands.    Home Assistive Devices / Equipment Home Assistive Devices/Equipment: Environmental consultant (specify type) Home Equipment: Walker - 2 wheels, Shower seat, Adaptive equipment, Bedside commode(3 wheeled walker)  Prior Device Use: Indicate devices/aids used by the patient prior to current illness, exacerbation  or injury? Walker, 2 wheeled in the house and Rollator out in the community or yard.   Prior Functional Level Prior Function Level of Independence: Independent with assistive device(s) Comments: Pt performs all ADLs and IADLs in home. Uses AE for LB dressing. uses RW all the time; neighbor does grocery shopping and driving  Self Care: Did the patient need help bathing, dressing, using the toilet or eating? Independent  Indoor Mobility: Did the patient need assistance with walking from room to room (with or without device)? Independent  Stairs: Did the patient need assistance with internal or external stairs (with or without device)? Independent  Functional Cognition: Did the patient need help planning regular tasks such as shopping or remembering to take medications? Independent  Current Functional Level Cognition  Arousal/Alertness: Awake/alert Overall Cognitive Status: Impaired/Different from baseline Current Attention Level: Selective Orientation Level: Oriented X4 Following Commands: Follows multi-step commands inconsistently Safety/Judgement: Decreased awareness of safety, Decreased awareness of deficits General Comments: Pt tangential with all activities. She is hard of hearing impacting her ability to follow directions at times. Poor safety awareness noted and decreased awareness in general.  Attention: Selective Selective Attention: Appears intact Memory: Appears intact Awareness: Impaired Awareness Impairment: Anticipatory  impairment Problem Solving: Appears intact Behaviors: Impulsive, Restless(she describes this as "being spunky" at baseline) Safety/Judgment: Impaired    Extremity Assessment (includes Sensation/Coordination)  Upper Extremity Assessment: Defer to OT evaluation, Generalized weakness, RUE deficits/detail, LUE deficits/detail RUE Deficits / Details: Pt reporting numbness in right hand. Pt with generalized weakness for BUEs and decreased FM skills as seen during ADLs and testing.  RUE Sensation: (numbness) RUE Coordination: decreased fine motor, decreased gross motor LUE Deficits / Details: Pt with generalized weakness for BUEs and decreased FM skills as seen during ADLs and testing.  LUE Coordination: decreased fine motor, decreased gross motor  Lower Extremity Assessment: Generalized weakness(baseline deficit) RLE Deficits / Details: stiff/spastic LE even with mobility LLE Deficits / Details: reports L LE longer than R LE; stiff/spastic LE even with mobility    ADLs  Overall ADL's : Needs assistance/impaired Eating/Feeding: Set up, Sitting Grooming: Oral care, Standing Grooming Details (indicate cue type and reason): Min A for standing balance and safety at sink Upper Body Bathing: Sitting, Set up, Supervision/ safety Lower Body Bathing: Minimal assistance, Sit to/from stand Upper Body Dressing : Set up, Supervision/safety, Sitting Lower Body Dressing: Minimal assistance, Sit to/from stand Lower Body Dressing Details (indicate cue type and reason): Pt with increased difficulty bringing ankles to knees due to tightness.  Toilet Transfer: Minimal assistance, Cueing for safety, Ambulation(simulated ) Toilet Transfer Details (indicate cue type and reason): Min A for safety and requiring cues not to sit prematurely Functional mobility during ADLs: Minimal assistance, +2 for safety/equipment, Rolling walker, Cueing for safety General ADL Comments: Pt highly unsafe at this time. She is very  motivated to complete tasks independently.     Mobility  Overal bed mobility: Needs Assistance Bed Mobility: Supine to Sit Supine to sit: Min guard General bed mobility comments: use of rail and HOB elevated slightly; increased time and effort     Transfers  Overall transfer level: Needs assistance Equipment used: Rolling walker (2 wheeled) Transfers: Sit to/from Stand Sit to Stand: Min assist General transfer comment: cues for safe hand placement and assist to steady    Ambulation / Gait / Stairs / Wheelchair Mobility  Ambulation/Gait Ambulation/Gait assistance: Min assist, Mod assist Gait Distance (Feet): 100 Feet Assistive device: Rolling walker (2 wheeled) Gait Pattern/deviations: Step-to  pattern, Decreased step length - left, Decreased stride length, Decreased dorsiflexion - right, Decreased dorsiflexion - left, Trunk flexed, Wide base of support General Gait Details: pt with increasing difficulty with distance and requires increased assistance for safety; pt with difficulty clearing feet last ~87ft  Gait velocity: decreased Stairs: Yes Stairs assistance: Min assist Stair Management: Two rails, Step to pattern, Forwards Number of Stairs: 2 General stair comments: cues for safety with foot placement; assistance for balance; pt reliant on bilat UE support; pt with more difficulty descending steps due to limited bilat knee ROM     Posture / Balance Balance Overall balance assessment: Needs assistance Sitting-balance support: No upper extremity supported, Feet supported Sitting balance-Leahy Scale: Good Standing balance support: Bilateral upper extremity supported, During functional activity Standing balance-Leahy Scale: Poor Standing balance comment: Relies on external assist or RW for support.     Special needs/care consideration BiPAP/CPAP: No CPM: No Continuous Drip IV: No Dialysis: No        Life Vest: No Oxygen: No Special Bed: No Trach Size: No Wound Vac (area):  No       Skin: Dry with ecchymosis to bilateral upper and lower extremities                                Bowel mgmt: Continent, last BM 01/27/18 Bladder mgmt: Continent with urgency same as prior to admission and patient wore depends at home Diabetic mgmt: No     Previous Home Environment Living Arrangements: Alone  Lives With: Alone Available Help at Discharge: Family, Available PRN/intermittently Type of Home: House Home Layout: One level Home Access: Stairs to enter Entrance Stairs-Rails: Right Entrance Stairs-Number of Steps: 4 Bathroom Shower/Tub: Multimedia programmer: Gorman: No Additional Comments: has walker outside shower to assist with steadying  Discharge Living Setting Plans for Discharge Living Setting: Patient's home, Lives with (comment)(Alone; however, son, Wille Glaser and his wife to assist after D/C.) Type of Home at Discharge: House Discharge Home Layout: One level Discharge Home Access: Stairs to enter Entrance Stairs-Rails: Right Entrance Stairs-Number of Steps: 4 Discharge Bathroom Shower/Tub: Walk-in shower Discharge Bathroom Toilet: Standard Discharge Bathroom Accessibility: Yes How Accessible: Accessible via walker Does the patient have any problems obtaining your medications?: No  Social/Family/Support Systems Patient Roles: Parent, Other (Comment)(Grandparent, Aunt, Neighbor, and friend ) Sport and exercise psychologist Information: Son, Tatelyn Vanhecke Anticipated Caregiver: Son and daugher-in-law Anticipated Caregiver's Contact Information: Jon's number:[phone] Ability/Limitations of Caregiver: None, but both say that her ultimate goal is to be Mod I with intermittent assist  Caregiver Availability: 24/7 Discharge Plan Discussed with Primary Caregiver: Yes Is Caregiver In Agreement with Plan?: Yes Does Caregiver/Family have Issues with Lodging/Transportation while Pt is in Rehab?: No  Goals/Additional Needs Patient/Family Goal for Rehab:  PT/OT: Supervision  Expected length of stay: 13-17 days  Cultural Considerations: None Dietary Needs: Heart Healthy diet restrictions  Equipment Needs: TBD Pt/Family Agrees to Admission and willing to participate: Yes Program Orientation Provided & Reviewed with Pt/Caregiver Including Roles  & Responsibilities: Yes Additional Information Needs: Patient has very supportive neighbors that also help her at home Information Needs to be Provided By: Team FYI  Decrease burden of Care through IP rehab admission: No  Possible need for SNF placement upon discharge: Potentially  Patient Condition: This patient's medical and functional status has changed since the consult dated 01/26/18 in which the Rehabilitation Physician determined and documented that the patient was potentially  appropriate for intensive rehabilitative care in an inpatient rehabilitation facility. Issues have been addressed and update has been discussed with Dr. Posey Pronto and patient now appropriate for inpatient rehabilitation. Will admit to inpatient rehab today.   Preadmission Screen Completed By:  Gunnar Fusi, 01/27/2018 2:13 PM ______________________________________________________________________   Discussed status with Dr. Posey Pronto on 01/27/18 at 1428 and received telephone approval for admission today.  Admission Coordinator:  Gunnar Fusi, time 1428/Date 01/27/18

## 2018-01-28 ENCOUNTER — Inpatient Hospital Stay (HOSPITAL_COMMUNITY): Payer: Medicare Other | Admitting: Physical Therapy

## 2018-01-28 ENCOUNTER — Inpatient Hospital Stay (HOSPITAL_COMMUNITY): Payer: Medicare Other | Admitting: Occupational Therapy

## 2018-01-28 DIAGNOSIS — R269 Unspecified abnormalities of gait and mobility: Secondary | ICD-10-CM

## 2018-01-28 DIAGNOSIS — I6319 Cerebral infarction due to embolism of other precerebral artery: Secondary | ICD-10-CM

## 2018-01-28 DIAGNOSIS — I69398 Other sequelae of cerebral infarction: Principal | ICD-10-CM

## 2018-01-28 DIAGNOSIS — G114 Hereditary spastic paraplegia: Secondary | ICD-10-CM

## 2018-01-28 LAB — COMPREHENSIVE METABOLIC PANEL
ALBUMIN: 3.2 g/dL — AB (ref 3.5–5.0)
ALK PHOS: 115 U/L (ref 38–126)
ALT: 16 U/L (ref 0–44)
ANION GAP: 8 (ref 5–15)
AST: 19 U/L (ref 15–41)
BUN: 24 mg/dL — AB (ref 8–23)
CALCIUM: 9.1 mg/dL (ref 8.9–10.3)
CO2: 23 mmol/L (ref 22–32)
Chloride: 108 mmol/L (ref 98–111)
Creatinine, Ser: 0.94 mg/dL (ref 0.44–1.00)
GFR calc Af Amer: >60 mL/min (ref >60)
GFR calc non Af Amer: 53 mL/min — ABNORMAL LOW (ref >60)
GLUCOSE: 108 mg/dL — AB (ref 70–99)
Potassium: 4.4 mmol/L (ref 3.5–5.1)
Sodium: 139 mmol/L (ref 135–145)
TOTAL PROTEIN: 6 g/dL — AB (ref 6.5–8.1)
Total Bilirubin: 0.6 mg/dL (ref 0.3–1.2)

## 2018-01-28 LAB — CBC WITH DIFFERENTIAL/PLATELET
Abs Immature Granulocytes: 0 10*3/uL (ref 0.0–0.1)
Basophils Absolute: 0 10*3/uL (ref 0.0–0.1)
Basophils Relative: 1 %
Eosinophils Absolute: 0.3 10*3/uL (ref 0.0–0.7)
Eosinophils Relative: 3 %
HCT: 36.2 % (ref 36.0–46.0)
Hemoglobin: 11.3 g/dL — ABNORMAL LOW (ref 12.0–15.0)
Immature Granulocytes: 0 %
Lymphocytes Relative: 20 %
Lymphs Abs: 1.7 10*3/uL (ref 0.7–4.0)
MCH: 30 pg (ref 26.0–34.0)
MCHC: 31.2 g/dL (ref 30.0–36.0)
MCV: 96 fL (ref 78.0–100.0)
Monocytes Absolute: 0.9 10*3/uL (ref 0.1–1.0)
Monocytes Relative: 11 %
Neutro Abs: 5.5 10*3/uL (ref 1.7–7.7)
Neutrophils Relative %: 65 %
Platelets: 207 10*3/uL (ref 150–400)
RBC: 3.77 MIL/uL — AB (ref 3.87–5.11)
RDW: 13.7 % (ref 11.5–15.5)
WBC: 8.4 10*3/uL (ref 4.0–10.5)

## 2018-01-28 NOTE — Progress Notes (Signed)
Physical Therapy Session Note  Patient Details  Name: Vicki Stein MRN: [id number] Date of Birth: 1928-01-04  Today's Date: 01/28/2018 PT Individual Time: 1975-8832 PT Individual Time Calculation (min): 27 min   Short Term Goals: Week 1:  PT Short Term Goal 1 (Week 1): =LTG due to ELOS  Skilled Therapeutic Interventions/Progress Updates:  Pt received in w/c reporting fatigue & requesting to return to bed but agreeable to tx with encouragement. Pt is HOH (deaf in L ear, R ear is best). Pt reports need to use restroom & ambulates in room>bathroom with RW & min assist demonstrating absent foot clearance as pt drags BLE feet on the floor reporting this is not her PLOF 2/2 new weakness in extremities. Pt performed 3/3 toileting task with continent void & steady assist for toilet transfers. Pt with blood in brief & NT made aware but pt reports RN & MD are aware and this isn't necessarily new. Pt with very poor awareness of proper use of RW as she will push it to the side when going up to sink to wash hands or will attempt to walk around it when transferring to w/c and therapist with difficulty correcting her during task 2/2 HOH. Transported pt room<>gym via w/c total assist for time management. Pt reports she has 3 STE with R rail at home. Attempted to have pt negotiate 6" step but pt unable 2/2 insufficient hip/knee strength therefore transitioned to 3" steps with B rails. Pt requires heavy cuing for compensatory step to pattern as pt attempting step over step without feet completely on each step; pt with ability to correct with ongoing cuing and occasional assist to place LLE down on lower step when descending stairs. Therapist educated pt on recommendation of 24 hr supervision at d/c and reports she plans to have her children stay in a room and assist her until she gets stronger. Back in room pt completes stand pivot with steady assist and sit>supine with supervision with limited ability to move in bed  once supine 2/2 spastic LE. At end of session pt left in bed with alarm set & all needs within reach.  Therapy Documentation Precautions:  Precautions Precautions: Fall Restrictions Weight Bearing Restrictions: No   See Function Navigator for Current Functional Status.   Therapy/Group: Individual Therapy  [name] 01/28/2018, 4:08 PM

## 2018-01-28 NOTE — Progress Notes (Signed)
Patient information reviewed and entered into eRehab system by Juliany Daughety, RN, CRRN, PPS Coordinator.  Information including medical coding and functional independence measure will be reviewed and updated through discharge.     Per nursing patient was given "Data Collection Information Summary for Patients in Inpatient Rehabilitation Facilities with attached "Privacy Act Statement-Health Care Records" upon admission.  

## 2018-01-28 NOTE — Progress Notes (Signed)
Social Work Patient ID: Vicki Stein, female   DOB: 1927-09-12, 84 y.o.   MRN: [id number]     Vicki Stein, [place] [facility]  Social Worker    Patient Care Conference  Signed  Date of Service:  01/28/2018  2:15 PM          Signed          Show:Clear all [x] Manual[x] Template[] Copied  Added by: [x] Louvenia Golomb, Gerline Legacy, LCSW   [] Hover for details   Inpatient RehabilitationTeam Conference and Plan of Care Update Date: 01/28/2018   Time: 10:50 AM      Patient Name: Vicki Stein      Medical Record Number: [id number]  Date of Birth: Nov 21, 1927 Sex: Female         Room/Bed: 4W17C/4W17C-01 Payor Info: Payor: MEDICARE / Plan: MEDICARE PART A AND B / Product Type: *No Product type* /     Admitting Diagnosis: B CVA  Admit Date/Time:  01/27/2018  4:58 PM Admission Comments: No comment available    Primary Diagnosis:  <principal problem not specified> Principal Problem: <principal problem not specified>       Patient Active Problem List    Diagnosis Date Noted  . Embolic stroke ([place]) 82/50/5397  . Benign essential HTN    . Hearing loss    . Prediabetes    . Acute blood loss anemia    . Cerebral embolism with cerebral infarction 01/25/2018  . CVA (cerebral vascular accident) ([place]) 01/25/2018  . Generalized weakness    . Blurred vision, bilateral    . Elevated troponin    . Endometrial adenocarcinoma ([place]) 01/24/2018  . Coronary artery disease 01/24/2018  . Cerebrovascular disease in cancer patient ([place]) 01/24/2018  . Pulmonary embolism ([place]) 01/24/2018  . Chest pain 03/25/2013  . Dyslipidemia 03/25/2013  . HTN (hypertension) 03/25/2013  . Gout 03/25/2013  . Unstable angina ([place]) 03/25/2013  . Hereditary spastic paraparesis ([place]) 01/15/2013  . Paraplegia, spastic congenital [facility])        Expected Discharge Date: Expected Discharge Date: (7 to 10 days, to be set at next week's conference)   Team Members Present: Physician leading conference: Dr. Alysia Penna Social Worker Present: Alfonse Alpers, LCSW Nurse Present: Dorthula Nettles, RN PT Present: Lavone Nian, PT OT Present: Simonne Come, OT SLP Present: Charolett Bumpers, SLP PPS Coordinator present : Daiva Nakayama, RN, CRRN       Current Status/Progress Goal Weekly Team Focus  Medical     Spastic paraplegia, Right hemiparesis from recent embolic  maintain medical stability  initiate rehab program   Bowel/Bladder     Continent of b&B, LBM 01/27/2018  remain continent of b&b  assess b&b needs q shift and PRN   Swallow/Nutrition/ Hydration               ADL's     min assist transfers and ambulation with RW, min assist bathing, setup UB dressing, max assist LB dressing without AE  Supervision, Mod I dressing  ADL retraining, dynamic balance, functional mobility in home environment   Mobility     eval pending         Communication               Safety/Cognition/ Behavioral Observations             Pain     no complaints of pain  pain 0/10  assess pain q shift and PRN   Skin     no skin issues  remain free of  infection and breakdown  assess skin q shift and PRN     Rehab Goals Patient on target to meet rehab goals: Yes Rehab Goals Revised: none - first conference on day of evals *See Care Plan and progress notes for long and short-term goals.      Barriers to Discharge   Current Status/Progress Possible Resolutions Date Resolved   Physician     Medical stability;Weight;Other (comments)  weightloss  initiating therapy  see above      Nursing   Decreased caregiver support;Medication compliance             PT  Decreased caregiver support;Home environment access/layout;Lack of/limited family support;Medical stability                 OT Decreased caregiver support;Lack of/limited family support               SLP            SW              Discharge Planning/Teaching Needs:  Pt wants to return to her home with her son and dtr-in-law to provide 24/7 care, as needed.   Son and dtr-in-law will be offered family education closer to pt's d/c.   Team Discussion:  Pt with endometrial CA and now with a stroke.  She's had a 45 pound weight loss over the last few months and doctors wanted her to have a hysterectomy, but this will be delayed due to the stroke.  Pt has increased tone in quads and hamstrings and she has hereditary spastic paraparesis/paraplegia.  Pt is continent and HOH on L side.  She can be impulsive and likes to sit on the side of the bed.  PT and OT still to eval.  Revisions to Treatment Plan:  none    Continued Need for Acute Rehabilitation Level of Care: The patient requires daily medical management by a physician with specialized training in physical medicine and rehabilitation for the following conditions: Daily direction of a multidisciplinary physical rehabilitation program to ensure safe treatment while eliciting the highest outcome that is of practical value to the patient.: Yes Daily medical management of patient stability for increased activity during participation in an intensive rehabilitation regime.: Yes Daily analysis of laboratory values and/or radiology reports with any subsequent need for medication adjustment of medical intervention for : Neurological problems;Mood/behavior problems   Haig Gerardo, Silvestre Mesi 01/28/2018, 2:15 PM

## 2018-01-28 NOTE — Progress Notes (Signed)
Inpatient Rehabilitation Center Individual Statement of Services  Patient Name:  Vicki Stein  Date:  01/28/2018  Welcome to the [facility].  Our goal is to provide you with an individualized program based on your diagnosis and situation, designed to meet your specific needs.  With this comprehensive rehabilitation program, you will be expected to participate in at least 3 hours of rehabilitation therapies Monday-Friday, with modified therapy programming on the weekends.  Your rehabilitation program will include the following services:  Physical Therapy (PT), Occupational Therapy (OT), 24 hour per day rehabilitation nursing, Case Management (Social Worker), Rehabilitation Medicine, Nutrition Services and Pharmacy Services  Weekly team conferences will be held on Wednesdays to discuss your progress.  Your Social Worker will talk with you frequently to get your input and to update you on team discussions.  Team conferences with you and your family in attendance may also be held.  Expected length of stay:  7 to 10 days  Overall anticipated outcome:  Supervision with minimal assistance for stairs  Depending on your progress and recovery, your program may change. Your Social Worker will coordinate services and will keep you informed of any changes. Your Social Worker's name and contact numbers are listed  below.  The following services may also be recommended but are not provided by the [facility] will be made to provide these services after discharge if needed.  Arrangements include referral to agencies that provide these services.  Your insurance has been verified to be:  Medicare and [place] Your primary doctor is:  Dr. Rikki Spearing  Pertinent information will be shared with your doctor and your insurance company.  Social Worker:  Alfonse Alpers, LCSW  [phone] or [phone]  Information discussed with and copy given to patient by: Trey Sailors, 01/28/2018, 2:22 PM

## 2018-01-28 NOTE — Progress Notes (Signed)
Physical Medicine and Rehabilitation Consult  Reason for Consult: Stroke with functional deficits.  Referring Physician: Dr. Leonie Man    HPI: Vicki Stein is a 84 y.o. female with history of hereditary spastic paraparesis, HTN, HOH, CVA with left sided weakness, endometrial CA (started on megace week PTA) who  was admitted on 01/25/18 with CP, blurred vision, dizziness as well as numbness and weakness BUE. History taken from chart review and patient. MRI brain reviewed, suggesting bilateral CVA. Per report, multiple small acute to early subacute infarcts throughout both cerebral and cerebellar hemispheres question watershed ischemia due to hypoperfusion.  CT chest revealed small RLL PE and she was started on Eliquis for treatment. 2D echo showed EF 55-60% with no wall abnormality, mildly calcified mitral valve and mildly increased PA pressures. Dr. Leonie Man felt that stroke likely due to cardioembolic v/s hypercoagulable state due to CA v/s marantic endocarditis. TCD ordered to rule out PFO.  Patient with weakness BUE with poor safety awareness, instability and impulsivity with high fall risk. CIR recommended due to functional decline.    Review of Systems  HENT: Positive for hearing loss.   Neurological: Positive for focal weakness and weakness.  All other systems reviewed and are negative.         Past Medical History:  Diagnosis Date  . Arthritis    all over  . Back pain   . Blood dyscrasia    on blood thiners  . Borderline diabetes   . Cerebrovascular disease   . Coronary artery disease    a. LHC (9/14):  pLM 30, oD1 50, D2 40-50, pRI 30, mCFX 30, mRCA 30, EF 65%  . GERD (gastroesophageal reflux disease)   . Gout   . Hemorrhoids   . High cholesterol   . History of kidney stones    60 years ago  . Hyperlipemia   . Hypertension   . Migraines   . Movement disorder   . Paraplegia, spastic congenital ([place])    inherited .   Marland Kitchen Peripheral vascular disease  (H[facility])    poor circulation lower legs  . Pneumonia    30 years ago  . Pre-diabetes    borderline  . Reflux   . Stroke [facility])    " 5 mini strokes"         Past Surgical History:  Procedure Laterality Date  . APPENDECTOMY    . CATARACT EXTRACTION W/ INTRAOCULAR LENS  IMPLANT, BILATERAL    . CHOLECYSTECTOMY    . COLONOSCOPY    . DILATATION & CURETTAGE/HYSTEROSCOPY WITH MYOSURE N/A 06/25/2017   Procedure: DILATATION & CURETTAGE/HYSTEROSCOPY;  Surgeon: Janyth Pupa, DO;  Location: [facility];  Service: Gynecology;  Laterality: N/A;  . DILATION AND CURETTAGE OF UTERUS    . ESOPHAGOGASTRODUODENOSCOPY N/A 11/14/2015   Procedure: ESOPHAGOGASTRODUODENOSCOPY (EGD);  Surgeon: Laurence Spates, MD;  Location: [facility] ENDOSCOPY;  Service: Endoscopy;  Laterality: N/A;  . HEMORRHOID SURGERY    . LEFT HEART CATHETERIZATION WITH CORONARY ANGIOGRAM N/A 03/25/2013   Procedure: LEFT HEART CATHETERIZATION WITH CORONARY ANGIOGRAM;  Surgeon: Burnell Blanks, MD;  Location: [facility] CATH LAB;  Service: Cardiovascular;  Laterality: N/A;  . ROTATOR CUFF REPAIR    . TONSILLECTOMY    . TUBAL LIGATION           Family History  Problem Relation Age of Onset  . Heart Problems Mother   . Heart Problems Father   . Heart attack Son     Social History:  Lives alone and independent with  RW PTA. Per  reports that she has never smoked. She has never used smokeless tobacco. She reports that she does not drink alcohol or use drugs.        Allergies  Allergen Reactions  . Aleve [Naproxen Sodium] Itching and Swelling  . Bactrim [Sulfamethoxazole-Trimethoprim] Itching and Other (See Comments)    Had a bad reaction, can't remember, worked me over  . Motrin [Ibuprofen] Itching and Swelling  . Penicillins Hives, Itching and Other (See Comments)    Has patient had a PCN reaction causing immediate rash, facial/tongue/throat swelling, SOB or lightheadedness with hypotension: No Has  patient had a PCN reaction causing severe rash involving mucus membranes or skin necrosis: No Has patient had a PCN reaction that required hospitalization No Has patient had a PCN reaction occurring within the last 10 years: No If all of the above answers are "NO", then may proceed with Cephalosporin use.  Marland Kitchen Ultram [Tramadol] Other (See Comments)    Itvhing and tongue swelling.          Medications Prior to Admission  Medication Sig Dispense Refill  . acetaminophen (TYLENOL) 500 MG tablet Take 500 mg by mouth every 6 (six) hours as needed for mild pain or moderate pain.    Marland Kitchen acetaminophen-codeine (TYLENOL #3) 300-30 MG tablet Take 1 tablet by mouth every 8 (eight) hours.    Marland Kitchen amLODipine (NORVASC) 2.5 MG tablet Take 2.5 mg by mouth See admin instructions. Take 1 tablet by mouth twice daily as needed for a blood pressure 150-90 2 hours after taking lisinopril    . atorvastatin (LIPITOR) 40 MG tablet Take 40 mg by mouth every evening.     . diclofenac sodium (VOLTAREN) 1 % GEL Apply 2 g topically 4 (four) times daily. Apply to knees, ankles, and hands, for arthritis     . diphenhydrAMINE (BENADRYL) 25 MG tablet Take 25 mg by mouth every 6 (six) hours as needed for allergies.    Marland Kitchen dipyridamole-aspirin (AGGRENOX) 200-25 MG per 12 hr capsule Take 1 capsule by mouth 2 (two) times daily.     . fluticasone (FLONASE) 50 MCG/ACT nasal spray Place 2 sprays into both nostrils daily.     Marland Kitchen lisinopril (PRINIVIL,ZESTRIL) 40 MG tablet Take 40 mg by mouth every morning.     . meclizine (ANTIVERT) 25 MG tablet Take 25 mg by mouth 2 (two) times daily.    . megestrol (MEGACE) 40 MG tablet Take 1 tablet (40 mg total) by mouth 3 (three) times daily. 90 tablet 3  . omeprazole (PRILOSEC) 40 MG capsule Take 40 mg by mouth at bedtime.     . ondansetron (ZOFRAN-ODT) 4 MG disintegrating tablet Take 4 mg by mouth 3 (three) times daily as needed for nausea or vomiting.      Home: Home  Living Family/patient expects to be discharged to:: Private residence Living Arrangements: Alone Available Help at Discharge: Family, Available PRN/intermittently Type of Home: House Home Access: Stairs to enter Technical brewer of Steps: 4 Entrance Stairs-Rails: Right Home Layout: One level Bathroom Shower/Tub: Multimedia programmer: [place]: Environmental consultant - 2 wheels, Shower seat, Adaptive equipment, Bedside commode(3 wheeled walker) Additional Comments: has walker outside shower to assist with steadying  Functional History: Prior Function Level of Independence: Independent with assistive device(s) Comments: Pt performs all ADLs and IADLs in home. Uses AE for LB dressing. uses RW all the time; neighbor does grocery shopping and driving Functional Status:  Mobility: Bed Mobility Overal bed mobility: Needs Assistance  Bed Mobility: Supine to Sit Supine to sit: Min guard General bed mobility comments: OOB in recliner Transfers Overall transfer level: Needs assistance Equipment used: Rolling walker (2 wheeled) Transfers: Sit to/from Stand Sit to Stand: Min assist General transfer comment: Pt highly impulsive with unsafe use of RW. Ambulation/Gait Ambulation/Gait assistance: Min assist, Min guard Gait Distance (Feet): 50 Feet Assistive device: Rolling walker (2 wheeled) Gait Pattern/deviations: Step-to pattern, Decreased dorsiflexion - right, Decreased dorsiflexion - left, Decreased stride length, Trunk flexed General Gait Details: stiff/rigid B LE with gait; poor hip/knee flexion with swing phase of gait; very impulsive require verbal cueing for safety Gait velocity: decreased  ADL: ADL Overall ADL's : Needs assistance/impaired Eating/Feeding: Set up, Sitting Grooming: Oral care, Standing Grooming Details (indicate cue type and reason): Min A for standing balance and safety at sink Upper Body Bathing: Sitting, Set up, Supervision/ safety Lower Body  Bathing: Minimal assistance, Sit to/from stand Upper Body Dressing : Set up, Supervision/safety, Sitting Lower Body Dressing: Minimal assistance, Sit to/from stand Lower Body Dressing Details (indicate cue type and reason): Pt with increased difficulty bringing ankles to knees due to tightness.  Toilet Transfer: Minimal assistance, Cueing for safety, Ambulation(simulated ) Toilet Transfer Details (indicate cue type and reason): Min A for safety and requiring cues not to sit prematurely Functional mobility during ADLs: Minimal assistance, +2 for safety/equipment, Rolling walker, Cueing for safety General ADL Comments: Pt highly unsafe at this time. She is very motivated to complete tasks independently.   Cognition: Cognition Overall Cognitive Status: Impaired/Different from baseline Orientation Level: Oriented X4 Cognition Arousal/Alertness: Awake/alert Behavior During Therapy: WFL for tasks assessed/performed, Impulsive Overall Cognitive Status: Impaired/Different from baseline Area of Impairment: Attention, Memory, Following commands, Safety/judgement, Awareness Current Attention Level: Selective Memory: Decreased recall of precautions Following Commands: Follows one step commands inconsistently, Follows multi-step commands inconsistently Safety/Judgement: Decreased awareness of safety, Decreased awareness of deficits General Comments: Pt tangential with all activities. She is hard of hearing impacting her ability to follow directions at times. Poor safety awareness noted and decreased awareness in general.    Blood pressure (!) 120/51, pulse 74, temperature 98.4 F (36.9 C), temperature source Oral, resp. rate 16, height 5\' 2"  (1.575 m), weight 62 kg (136 lb 11 oz), SpO2 100 %. Physical Exam  Constitutional: She is oriented to person, place, and time. She appears well-developed and well-nourished.  HENT:  Head: Normocephalic and atraumatic.  Right Ear: External ear normal.  Left  Ear: External ear normal.  Eyes: EOM are normal. Right eye exhibits no discharge. Left eye exhibits no discharge.  Neck: Normal range of motion. Neck supple.  Cardiovascular: Normal rate and regular rhythm.  Respiratory: Effort normal and breath sounds normal.  GI: Soft. Bowel sounds are normal.  Musculoskeletal:  No edema or tenderness in extremities  Neurological: She is alert and oriented to person, place, and time.  Motor: Bilateral upper extremities: 4+/5 proximal distal Bilateral lower extremity: Hip flexion 2+/5 knee extension to -/5, ankle dorsiflexion 2+/5 with increased tone HOH  Skin: Skin is warm and dry.  Psychiatric: Her affect is blunt. Her speech is delayed. She is slowed.   Assessment/Plan: Diagnosis: bilateral CVA Labs and images (see above) independently reviewed.  Records reviewed and summated above. Stroke: Continue secondary stroke prophylaxis and Risk Factor Modification listed below:   Antiplatelet therapy:   Blood Pressure Management:  Continue current medication with prn's with permisive HTN per primary team Statin Agent:   Prediabetes management:   Tobacco abuse:  1. Does the need for close, 24 hr/day medical supervision in concert with the patient's rehab needs make it unreasonable for this patient to be served in a less intensive setting? Yes  2. Co-Morbidities requiring supervision/potential complications: hereditary spastic paraparesis, HTN (monitor and provide prns in accordance with increased physical exertion and pain), HOH, CVA with left sided weakness, endometrial CA (started on megace week PTA), prediabetes (Monitor in accordance with exercise and adjust meds as necessary), ABLA (transfuse if necessary to ensure appropriate perfusion for increased activity tolerance), PE (cont meds) 3. Due to bladder management, bowel management, safety, disease management, medication administration and patient education, does the patient require 24 hr/day rehab  nursing? Yes 4. Does the patient require coordinated care of a physician, rehab nurse, PT (1-2 hrs/day, 5 days/week), OT (1-2 hrs/day, 5 days/week) and SLP (1-2 hrs/day, 5 days/week) to address physical and functional deficits in the context of the above medical diagnosis(es)? Yes Addressing deficits in the following areas: balance, endurance, locomotion, strength, transferring, bathing, dressing, toileting, cognition and psychosocial support 5. Can the patient actively participate in an intensive therapy program of at least 3 hrs of therapy per day at least 5 days per week? Yes 6. The potential for patient to make measurable gains while on inpatient rehab is excellent 7. Anticipated functional outcomes upon discharge from inpatient rehab are supervision  with PT, supervision with OT, supervision with SLP. 8. Estimated rehab length of stay to reach the above functional goals is: 13-17 days. 9. Anticipated D/C setting: Home 10. Anticipated post D/C treatments: HH therapy and Home excercise program 11. Overall Rehab/Functional Prognosis: good  RECOMMENDATIONS: This patient's condition is appropriate for continued rehabilitative care in the following setting: Patient will require assistance at discharge, CIR if assistance available. Patient has agreed to participate in recommended program. Potentially Note that insurance prior authorization may be required for reimbursement for recommended care.  Comment: Rehab Admissions Coordinator to follow up.   I have personally performed a face to face diagnostic evaluation, including, but not limited to relevant history and physical exam findings, of this patient and developed relevant assessment and plan.  Additionally, I have reviewed and concur with the physician assistant's documentation above.   Vicki Lesch, MD, ABPMR Vicki Leriche, PA-C 01/26/2018          Revision History                        Routing History

## 2018-01-28 NOTE — Patient Care Conference (Signed)
Inpatient RehabilitationTeam Conference and Plan of Care Update Date: 01/28/2018   Time: 10:50 AM    Patient Name: Vicki Stein      Medical Record Number: [id number]  Date of Birth: [birth year] Sex: Female         Room/Bed: 4W17C/4W17C-01 Payor Info: Payor: MEDICARE / Plan: MEDICARE PART A AND B / Product Type: *No Product type* /    Admitting Diagnosis: B CVA  Admit Date/Time:  01/27/2018  4:58 PM Admission Comments: No comment available   Primary Diagnosis:  <principal problem not specified> Principal Problem: <principal problem not specified>  Patient Active Problem List   Diagnosis Date Noted  . Embolic stroke ([place]) 82/99/3716  . Benign essential HTN   . Hearing loss   . Prediabetes   . Acute blood loss anemia   . Cerebral embolism with cerebral infarction 01/25/2018  . CVA (cerebral vascular accident) ([place]) 01/25/2018  . Generalized weakness   . Blurred vision, bilateral   . Elevated troponin   . Endometrial adenocarcinoma ([place]) 01/24/2018  . Coronary artery disease 01/24/2018  . Cerebrovascular disease in cancer patient ([place]) 01/24/2018  . Pulmonary embolism ([place]) 01/24/2018  . Chest pain 03/25/2013  . Dyslipidemia 03/25/2013  . HTN (hypertension) 03/25/2013  . Gout 03/25/2013  . Unstable angina ([place]) 03/25/2013  . Hereditary spastic paraparesis ([place]) 01/15/2013  . Paraplegia, spastic congenital [facility])     Expected Discharge Date: Expected Discharge Date: (7 to 10 days, to be set at next week's conference)  Team Members Present: Physician leading conference: Dr. Alysia Penna Social Worker Present: Alfonse Alpers, LCSW Nurse Present: Dorthula Nettles, RN PT Present: Lavone Nian, PT OT Present: Simonne Come, OT SLP Present: Charolett Bumpers, SLP PPS Coordinator present : Daiva Nakayama, RN, CRRN     Current Status/Progress Goal Weekly Team Focus  Medical   Spastic paraplegia, Right hemiparesis from recent embolic  maintain medical stability  initiate rehab  program   Bowel/Bladder   Continent of b&B, LBM 01/27/2018  remain continent of b&b  assess b&b needs q shift and PRN   Swallow/Nutrition/ Hydration             ADL's   min assist transfers and ambulation with RW, min assist bathing, setup UB dressing, max assist LB dressing without AE  Supervision, Mod I dressing  ADL retraining, dynamic balance, functional mobility in home environment   Mobility   eval pending         Communication             Safety/Cognition/ Behavioral Observations            Pain   no complaints of pain  pain 0/10  assess pain q shift and PRN   Skin   no skin issues  remain free of infection and breakdown  assess skin q shift and PRN    Rehab Goals Patient on target to meet rehab goals: Yes Rehab Goals Revised: none - first conference on day of evals *See Care Plan and progress notes for long and short-term goals.     Barriers to Discharge  Current Status/Progress Possible Resolutions Date Resolved   Physician    Medical stability;Weight;Other (comments)  weightloss  initiating therapy  see above      Nursing  Decreased caregiver support;Medication compliance               PT  Decreased caregiver support;Home environment access/layout;Lack of/limited family support;Medical stability  OT Decreased caregiver support;Lack of/limited family support                SLP                SW                Discharge Planning/Teaching Needs:  Pt wants to return to her home with her son and dtr-in-law to provide 24/7 care, as needed.  Son and dtr-in-law will be offered family education closer to pt's d/c.   Team Discussion:  Pt with endometrial CA and now with a stroke.  She's had a 45 pound weight loss over the last few months and doctors wanted her to have a hysterectomy, but this will be delayed due to the stroke.  Pt has increased tone in quads and hamstrings and she has hereditary spastic paraparesis/paraplegia.  Pt is continent and  HOH on L side.  She can be impulsive and likes to sit on the side of the bed.  PT and OT still to eval.  Revisions to Treatment Plan:  none    Continued Need for Acute Rehabilitation Level of Care: The patient requires daily medical management by a physician with specialized training in physical medicine and rehabilitation for the following conditions: Daily direction of a multidisciplinary physical rehabilitation program to ensure safe treatment while eliciting the highest outcome that is of practical value to the patient.: Yes Daily medical management of patient stability for increased activity during participation in an intensive rehabilitation regime.: Yes Daily analysis of laboratory values and/or radiology reports with any subsequent need for medication adjustment of medical intervention for : Neurological problems;Mood/behavior problems  Juvenal Umar, Silvestre Mesi 01/28/2018, 2:15 PM

## 2018-01-28 NOTE — Progress Notes (Addendum)
Subjective/Complaints: No issues overnite discussed Ca diagnosis 45lb weight loss  ROS- denies CP, SOB, N/V/D  Objective: Vital Signs: Blood pressure 126/64, pulse 77, temperature 99 F (37.2 C), temperature source Oral, resp. rate 16, height _0  (1.575 m), weight 62.2 kg (137 lb 2 oz), SpO2 97 %. No results found. Results for orders placed or performed during the hospital encounter of 01/27/18 (from the past 72 hour(s))  CBC WITH DIFFERENTIAL     Status: Abnormal   Collection Time: 01/28/18  6:41 AM  Result Value Ref Range   WBC 8.4 4.0 - 10.5 K/uL   RBC 3.77 (L) 3.87 - 5.11 MIL/uL   Hemoglobin 11.3 (L) 12.0 - 15.0 g/dL   HCT 36.2 36.0 - 46.0 %   MCV 96.0 78.0 - 100.0 fL   MCH 30.0 26.0 - 34.0 pg   MCHC 31.2 30.0 - 36.0 g/dL   RDW 13.7 11.5 - 15.5 %   Platelets 207 150 - 400 K/uL   Neutrophils Relative % 65 %   Neutro Abs 5.5 1.7 - 7.7 K/uL   Lymphocytes Relative 20 %   Lymphs Abs 1.7 0.7 - 4.0 K/uL   Monocytes Relative 11 %   Monocytes Absolute 0.9 0.1 - 1.0 K/uL   Eosinophils Relative 3 %   Eosinophils Absolute 0.3 0.0 - 0.7 K/uL   Basophils Relative 1 %   Basophils Absolute 0.0 0.0 - 0.1 K/uL   Immature Granulocytes 0 %   Abs Immature Granulocytes 0.0 0.0 - 0.1 K/uL    Comment: Performed at [facility], [address] [place], [place] [address]  Comprehensive metabolic panel     Status: Abnormal   Collection Time: 01/28/18  6:41 AM  Result Value Ref Range   Sodium 139 135 - 145 mmol/L   Potassium 4.4 3.5 - 5.1 mmol/L   Chloride 108 98 - 111 mmol/L   CO2 23 22 - 32 mmol/L   Glucose, Bld 108 (H) 70 - 99 mg/dL   BUN 24 (H) 8 - 23 mg/dL   Creatinine, Ser 0.94 0.44 - 1.00 mg/dL   Calcium 9.1 8.9 - 10.3 mg/dL   Total Protein 6.0 (L) 6.5 - 8.1 g/dL   Albumin 3.2 (L) 3.5 - 5.0 g/dL   AST 19 15 - 41 U/L   ALT 16 0 - 44 U/L   Alkaline Phosphatase 115 38 - 126 U/L   Total Bilirubin 0.6 0.3 - 1.2 mg/dL   GFR calc non Af Amer 53 (L) >60 mL/min   GFR calc Af Amer  >60 >60 mL/min    Comment: (NOTE) The eGFR has been calculated using the CKD EPI equation. This calculation has not been validated in all clinical situations. eGFR's persistently <60 mL/min signify possible Chronic Kidney Disease.    Anion gap 8 5 - 15    Comment: Performed at [facility] [address] [place], [place] [address]     HEENT: normal Cardio: RRR and no murmur Resp: CTA B/L and Unlabored GI: BS positive and NT, ND Extremity:  No Edema Skin:   Intact Neuro: Alert/Oriented, Anxious, Normal Sensory, Abnormal Motor 4/5 RUE, 5/5 LUE, 3- Bilateral KE ADF, increased tone in Bilateral quads and hamstrings with co contraction and Abnormal FMC Ataxic/ dec FMC Musc/Skel:  Other no pain with UE or LE movement Gen NAD   Assessment/Plan: 1. Functional deficits secondary to RUE weakness and B LE spastic weakness which require 3+ hours per day of interdisciplinary therapy in  a comprehensive inpatient rehab setting. Physiatrist is providing close team supervision and 24 hour management of active medical problems listed below. Physiatrist and rehab team continue to assess barriers to discharge/monitor patient progress toward functional and medical goals. FIM:                      Function - Expression Expression: Verbal           Medical Problem List and Plan: 1.  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  Evals today with PT, OT 2.  DVT Prophylaxis/Anticoagulation: Pharmaceutical: Other (comment)--Eliquis 3. Pain Management: N/A 4. Mood: LCSW to follow for evaluation and support.  5. Neuropsych: This patient is capable of making decisions on her own behalf. 6. Skin/Wound Care: routine pressure relief measures.  7. Fluids/Electrolytes/Nutrition: Monitor I/O. Check lytes in am. 8. HTN: Monitor BP bid. Continue  Lisinopril daily- good control Vitals:   01/27/18 1931 01/28/18 0502  BP: (!) 140/59 126/64  Pulse: 84 77  Resp: 18 16  Temp: 98.1 F (36.7 C) 99 F (37.2 C)  SpO2: 94% 97%  9. Endometrial cancer: Treated non surgically--on megace tid.  Per pt her Gyn would like pt to have hysterectomy will discuss ~6mopost CVA 10. Anemia: Continue to monitor for stability.     LOS (Days) 1 A FACE TO FACE EVALUATION WAS PERFORMED  ACharlett Blake7/24/2019, 8:20 AM

## 2018-01-28 NOTE — Evaluation (Signed)
Occupational Therapy Assessment and Plan  Patient Details  Name: Vicki Stein MRN: [id number] Date of Birth: Jul 25, 1927  OT Diagnosis: abnormal posture, disturbance of vision and muscle weakness (generalized) Rehab Potential: Rehab Potential (ACUTE ONLY): Good ELOS: 7-10 days   Today's Date: 01/28/2018 OT Individual Time: 4585-9292 OT Individual Time Calculation (min): 55 min     Problem List:  Patient Active Problem List   Diagnosis Date Noted  . Embolic stroke ([place]) 44/62/8638  . Benign essential HTN   . Hearing loss   . Prediabetes   . Acute blood loss anemia   . Cerebral embolism with cerebral infarction 01/25/2018  . CVA (cerebral vascular accident) ([place]) 01/25/2018  . Generalized weakness   . Blurred vision, bilateral   . Elevated troponin   . Endometrial adenocarcinoma ([place]) 01/24/2018  . Coronary artery disease 01/24/2018  . Cerebrovascular disease in cancer patient ([place]) 01/24/2018  . Pulmonary embolism ([place]) 01/24/2018  . Chest pain 03/25/2013  . Dyslipidemia 03/25/2013  . HTN (hypertension) 03/25/2013  . Gout 03/25/2013  . Unstable angina ([place]) 03/25/2013  . 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 Paraplegia, spastic congenital [facility])     Past Medical History:  Past Medical History:  Diagnosis Date  . Arthritis    all over  . Back pain   . Blood dyscrasia    on blood thiners  . Borderline diabetes   . Cerebrovascular disease   . Coronary artery disease    a. LHC (9/14):  pLM 30, oD1 50, D2 40-50, pRI 30, mCFX 30, mRCA 30, EF 65%  . GERD (gastroesophageal reflux disease)   . Gout   . Hemorrhoids   . High cholesterol   . History of kidney stones    60 years ago  . Hyperlipemia   . Hypertension   . Migraines   . Movement disorder   . 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   Marland [facility] Peripheral vascular disease ([facility])    poor circulation lower legs  . Pneumonia    30 years ago  . Pre-diabetes    borderline  . Reflux   .  Stroke [facility])    " 5 mini strokes"   Past Surgical History:  Past Surgical History:  Procedure Laterality Date  . APPENDECTOMY    . CATARACT EXTRACTION W/ INTRAOCULAR LENS  IMPLANT, BILATERAL    . CHOLECYSTECTOMY    . COLONOSCOPY    . DILATATION & CURETTAGE/HYSTEROSCOPY WITH MYOSURE N/A 06/25/2017   Procedure: DILATATION & CURETTAGE/HYSTEROSCOPY;  Surgeon: Janyth Pupa, DO;  Location: [facility];  Service: Gynecology;  Laterality: N/A;  . DILATION AND CURETTAGE OF UTERUS    . ESOPHAGOGASTRODUODENOSCOPY N/A 11/14/2015   Procedure: ESOPHAGOGASTRODUODENOSCOPY (EGD);  Surgeon: Laurence Spates, MD;  Location: [facility] ENDOSCOPY;  Service: Endoscopy;  Laterality: N/A;  . HEMORRHOID SURGERY    . LEFT HEART CATHETERIZATION WITH CORONARY ANGIOGRAM N/A 03/25/2013   Procedure: LEFT HEART CATHETERIZATION WITH CORONARY ANGIOGRAM;  Surgeon: Burnell Blanks, MD;  Location: [facility] CATH LAB;  Service: Cardiovascular;  Laterality: N/A;  . ROTATOR CUFF REPAIR    . TONSILLECTOMY    . TUBAL LIGATION      Assessment & Plan Clinical Impression: Patient is a 84 y.o. year old female with history of hereditary spastic paraparesis, HTN, CVA with left sided weakness, endometrial cancer with recent addition of megace; who was admitted on 01/25/2018 with chest pain, blurred vision, dizziness as well as numbness and weakness of bilateral upper extremity.  History taken from  chart review, neighbor, and patient. MRI of brain reviewed, showing b/l CVA.  Per report, multiple small acute to early subacute infarct cerebral and cerebellar hemispheres question watershed ischemia due to hypoperfusion.  Chest CT showed small right lower lobe PE and she was started on Eliquis for treatment.  2D echo showed EF of 55 to 60% with no wall abnormality, mildly calcified mitral valve and mildly increased pulmonary artery pressures.  Dr. Leonie Man felt that stroke likely embolic cardioembolic versus due to hypercoagulable state from cancer versus marantic  endocarditis.  Patient currently with bilateral upper extremity weakness with poor safety awareness, instability and impulsivity with high risk for falls.  CIR was recommended due to functional decline.   Patient transferred to CIR on 01/27/2018 .    Patient currently requires min with basic self-care skills secondary to muscle weakness and muscle joint tightness, decreased visual acuity and decreased postural control and decreased balance strategies.  Prior to hospitalization, patient could complete ADLs with modified independent .  Patient will benefit from skilled intervention to increase independence with basic self-care skills prior to discharge home, recommding 24 hr supervision.  Anticipate patient will require 24 hour supervision and follow up home health.  OT - End of Session Activity Tolerance: Tolerates 30+ min activity without fatigue Endurance Deficit: No OT Assessment Rehab Potential (ACUTE ONLY): Good OT Barriers to Discharge: Decreased caregiver support;Lack of/limited family support OT Patient demonstrates impairments in the following area(s): Balance;Endurance;Motor;Pain;Safety;Vision OT Basic ADL's Functional Problem(s): Grooming;Bathing;Dressing;Toileting OT Advanced ADL's Functional Problem(s): Simple Meal Preparation OT Transfers Functional Problem(s): Toilet;Tub/Shower OT Additional Impairment(s): None OT Plan OT Intensity: Minimum of 1-2 x/day, 45 to 90 minutes OT Frequency: 5 out of 7 days OT Duration/Estimated Length of Stay: 7-10 days OT Treatment/Interventions: Balance/vestibular training;Discharge planning;DME/adaptive equipment instruction;Functional mobility training;Neuromuscular re-education;Pain management;Patient/family education;Psychosocial support;Self Care/advanced ADL retraining;Therapeutic Activities;Therapeutic Exercise;UE/LE Strength taining/ROM;UE/LE Coordination activities;Visual/perceptual remediation/compensation OT Basic Self-Care Anticipated  Outcome(s): Supervision OT Toileting Anticipated Outcome(s): Supervision OT Bathroom Transfers Anticipated Outcome(s): Supervision OT Recommendation Patient destination: Home Follow Up Recommendations: 24 hour supervision/assistance;Home health OT Equipment Recommended: None recommended by OT(pt has all equipment)   Skilled Therapeutic Intervention OT eval completed with discussion of rehab process, OT purpose, POC, ELOS, and goals.  ADL assessment completed with bathing and dressing at sit > stand level in room shower.  Pt received seated EOB reporting need to toilet. Ambulated to toilet with RW with min assist, dragging LLE - pt reports mobility is not far from baseline.  Completed toilet transfer and toileting tasks with min/steady assist.  Engaged in bathing at sit > stand level in room shower with steadying assist when washing buttocks and between legs.  Pt reports utilizing AE for LB bathing and dressing, requiring assistance this session without AE.  Pt left seated upright in w/c with seat belt alarm on due to impulsivity and decreased safety awareness and all needs in reach.   OT Evaluation Precautions/Restrictions  Precautions Precautions: Fall Restrictions Weight Bearing Restrictions: No Pain Pain Assessment Pain Scale: 0-10 Pain Score: 0-No pain Home Living/Prior Functioning Home Living Family/patient expects to be discharged to:: Private residence Living Arrangements: Alone Available Help at Discharge: Family, Available PRN/intermittently, Neighbor Type of Home: House Home Access: Stairs to enter Technical brewer of Steps: 4 Entrance Stairs-Rails: Left Home Layout: One level Bathroom Shower/Tub: Multimedia programmer: Standard Additional Comments: has shower seat, uses walker outside shower to assist with steadying  Lives With: Alone Prior Function Level of Independence: Requires assistive device for independence  Able  to Take Stairs?: Yes Driving:  No Vocation: Retired Comments: Pt performs all ADLs and IADLs in home. Uses AE for LB dressing. uses RW all the time; neighbor does grocery shopping and driving ADL  See Function Navigator Vision Baseline Vision/History: Wears glasses;Cataracts(cataract sx with lense placement) Wears Glasses: At all times Vision Assessment?: Yes Eye Alignment: Within Functional Limits Ocular Range of Motion: Within Functional Limits Alignment/Gaze Preference: Within Defined Limits Tracking/Visual Pursuits: Decreased smoothness of horizontal tracking;Decreased smoothness of vertical tracking;Unable to hold eye position out of midline Additional Comments: pt reports blurred vision with eye movement Perception  Perception: Within Functional Limits Praxis Praxis: Intact Cognition Overall Cognitive Status: Within Functional Limits for tasks assessed Arousal/Alertness: Awake/alert Orientation Level: Person;Place;Situation Person: Oriented Place: Oriented Situation: Oriented Year: 2019 Month: July Day of Week: Correct Memory: Appears intact Immediate Memory Recall: Sock;Blue;Bed Memory Recall: Sock;Blue;Bed Memory Recall Sock: Without Cue Memory Recall Blue: Without Cue Memory Recall Bed: Without Cue Attention: Selective Selective Attention: Appears intact Awareness: Impaired Awareness Impairment: Anticipatory impairment Problem Solving: Appears intact Behaviors: Impulsive;Restless Safety/Judgment: Impaired Sensation Sensation Light Touch: Appears Intact Proprioception: Appears Intact Coordination Gross Motor Movements are Fluid and Coordinated: No Fine Motor Movements are Fluid and Coordinated: No Finger Nose Finger Test: slow but WFL bilaterally Heel Shin Test: not tested 2/2 BLE ROM limitations Motor  Motor Motor: Abnormal tone Motor - Skilled Clinical Observations: extensor tone in BLE Mobility  Bed Mobility Bed Mobility: Supine to Sit;Sit to Supine Supine to Sit:  Supervision/Verbal cueing Sit to Supine: Supervision/Verbal cueing Transfers Sit to Stand: Minimal Assistance - Patient > 75% Stand to Sit: Minimal Assistance - Patient > 75%  Trunk/Postural Assessment  Cervical Assessment Cervical Assessment: Exceptions to WFL(forward head) Thoracic Assessment Thoracic Assessment: Exceptions to WFL(kyphotic) Lumbar Assessment Lumbar Assessment: Within Functional Limits Postural Control Postural Control: Within Functional Limits  Balance Balance Balance Assessed: Yes Static Sitting Balance Static Sitting - Balance Support: Feet supported Static Sitting - Level of Assistance: 5: Stand by assistance Dynamic Sitting Balance Dynamic Sitting - Balance Support: Feet supported Dynamic Sitting - Level of Assistance: 5: Stand by assistance Static Standing Balance Static Standing - Balance Support: Bilateral upper extremity supported Static Standing - Level of Assistance: 4: Min assist Dynamic Standing Balance Dynamic Standing - Balance Support: Bilateral upper extremity supported Dynamic Standing - Level of Assistance: 4: Min assist Dynamic Standing - Balance Activities: Reaching for objects Extremity/Trunk Assessment RUE Assessment RUE Assessment: Exceptions to [facility] Active Range of Motion (AROM) Comments: shoulder flexion grossly 80*, elbow and wrist [facility] General Strength Comments: strength grossly 4/5 LUE Assessment LUE Assessment: Exceptions to [facility] Active Range of Motion (AROM) Comments: shoulder flexion grossly 80*, elbow and wrist WFL General Strength Comments: strength grossly 4/5   See Function Navigator for Current Functional Status.   Refer to Care Plan for Long Term Goals  Recommendations for other services: None    Discharge Criteria: Patient will be discharged from OT if patient refuses treatment 3 consecutive times without medical reason, if treatment goals not met, if there is a change in medical status, if patient makes no progress  towards goals or if patient is discharged from hospital.  The above assessment, treatment plan, treatment alternatives and goals were discussed and mutually agreed upon: by patient  Simonne Come 01/28/2018, 12:32 PM

## 2018-01-28 NOTE — Progress Notes (Signed)
Physical Therapy Session Note  Patient Details  Name: Vicki Stein MRN: [id number] Date of Birth: Jul 18, 1927  Today's Date: 01/28/2018 PT Individual Time: 4076-8088 PT Individual Time Calculation (min): 27 min   Short Term Goals: Week 1:  PT Short Term Goal 1 (Week 1): =LTG due to ELOS  Skilled Therapeutic Interventions/Progress Updates:    Pt in chair upon arrival and agreeable to PT session. Transfers: sit<>stand CGA with repeated cued for sequence and safety. Performed from w/c, mat table and chair. Amb: 40 ft X2, 10 ft x1, up/down 1 step with min assist and bilateral rail. Difficulty advancing LE bilat. LEs with gait. Poor hip flexion with swing phase. Improved with verbal cues to focus on hip flexion but fatigues quickly. TE: standing hip flexion, seated hip flexion - each bilat. 1X7. W/C propulsion performed with intermittent manual assist to avoid drifting Rt. Following session pt up in w/c with seatbelt alarm on and all needs in reach.   Therapy Documentation Precautions:  Precautions Precautions: Fall Restrictions Weight Bearing Restrictions: No Pain: reports pain as mild with recent medication. Monitored during session.      See Function Navigator for Current Functional Status.   Therapy/Group: Individual Therapy  Linard Millers 01/28/2018, 4:13 PM

## 2018-01-28 NOTE — Progress Notes (Signed)
Physical Therapy Assessment and Plan  Patient Details  Name: Vicki Stein MRN: [id number] Date of Birth: 04-20-1928  PT Diagnosis: Abnormal posture, Abnormality of gait, Difficulty walking and Muscle weakness Rehab Potential: Good ELOS: 7-10 days   Today's Date: 01/28/2018 PT Individual Time: 0800-0900 PT Individual Time Calculation (min): 60 min    Problem List:  Patient Active Problem List   Diagnosis Date Noted  . Embolic stroke ([place]) 41/93/7902  . Benign essential HTN   . Hearing loss   . Prediabetes   . Acute blood loss anemia   . Cerebral embolism with cerebral infarction 01/25/2018  . CVA (cerebral vascular accident) ([place]) 01/25/2018  . Generalized weakness   . Blurred vision, bilateral   . Elevated troponin   . Endometrial adenocarcinoma ([place]) 01/24/2018  . Coronary artery disease 01/24/2018  . Cerebrovascular disease in cancer patient ([place]) 01/24/2018  . Pulmonary embolism ([place]) 01/24/2018  . Chest pain 03/25/2013  . Dyslipidemia 03/25/2013  . HTN (hypertension) 03/25/2013  . Gout 03/25/2013  . Unstable angina ([place]) 03/25/2013  . 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 Paraplegia, spastic congenital [facility])     Past Medical History:  Past Medical History:  Diagnosis Date  . Arthritis    all over  . Back pain   . Blood dyscrasia    on blood thiners  . Borderline diabetes   . Cerebrovascular disease   . Coronary artery disease    a. LHC (9/14):  pLM 30, oD1 50, D2 40-50, pRI 30, mCFX 30, mRCA 30, EF 65%  . GERD (gastroesophageal reflux disease)   . Gout   . Hemorrhoids   . High cholesterol   . History of kidney stones    60 years ago  . Hyperlipemia   . Hypertension   . Migraines   . Movement disorder   . 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   Marland Kitchen Peripheral vascular disease ([facility])    poor circulation lower legs  . Pneumonia    30 years ago  . Pre-diabetes    borderline  . Reflux   . Stroke [facility])    " 5 mini strokes"    Past Surgical History:  Past Surgical History:  Procedure Laterality Date  . APPENDECTOMY    . CATARACT EXTRACTION W/ INTRAOCULAR LENS  IMPLANT, BILATERAL    . CHOLECYSTECTOMY    . COLONOSCOPY    . DILATATION & CURETTAGE/HYSTEROSCOPY WITH MYOSURE N/A 06/25/2017   Procedure: DILATATION & CURETTAGE/HYSTEROSCOPY;  Surgeon: Janyth Pupa, DO;  Location: [facility];  Service: Gynecology;  Laterality: N/A;  . DILATION AND CURETTAGE OF UTERUS    . ESOPHAGOGASTRODUODENOSCOPY N/A 11/14/2015   Procedure: ESOPHAGOGASTRODUODENOSCOPY (EGD);  Surgeon: Laurence Spates, MD;  Location: [facility] ENDOSCOPY;  Service: Endoscopy;  Laterality: N/A;  . HEMORRHOID SURGERY    . LEFT HEART CATHETERIZATION WITH CORONARY ANGIOGRAM N/A 03/25/2013   Procedure: LEFT HEART CATHETERIZATION WITH CORONARY ANGIOGRAM;  Surgeon: Burnell Blanks, MD;  Location: [facility] CATH LAB;  Service: Cardiovascular;  Laterality: N/A;  . ROTATOR CUFF REPAIR    . TONSILLECTOMY    . TUBAL LIGATION      Assessment & Plan Clinical Impression:  Vicki Stein is an 84 year old female with history of hereditary spastic paraparesis, HTN, CVA with left sided weakness, endometrial cancer with recent addition of megace; who was admitted on 01/25/2018 with chest pain, blurred vision, dizziness as well as numbness and weakness of bilateral upper extremity.  History taken from chart review,  neighbor, and patient. MRI of brain reviewed, showing b/l CVA.  Per report, multiple small acute to early subacute infarct cerebral and cerebellar hemispheres question watershed ischemia due to hypoperfusion.  Chest CT showed small right lower lobe PE and she was started on Eliquis for treatment.  2D echo showed EF of 55 to 60% with no wall abnormality, mildly calcified mitral valve and mildly increased pulmonary artery pressures.  Dr. Leonie Man felt that stroke likely embolic cardioembolic versus due to hypercoagulable state from cancer versus marantic endocarditis.  Patient  currently with bilateral upper extremity weakness with poor safety awareness, instability and impulsivity with high risk for falls.  CIR was recommended due to functional decline.  Patient transferred to CIR on 01/27/2018 .   Patient currently requires min with mobility secondary to muscle weakness and muscle joint tightness, abnormal tone and decreased standing balance, decreased postural control and decreased balance strategies.  Prior to hospitalization, patient was modified independent  with mobility and lived with Alone in a House home.  Home access is 4Stairs to enter.  Patient will benefit from skilled PT intervention to maximize safe functional mobility, minimize fall risk and decrease caregiver burden for planned discharge home with 24 hour supervision.  Anticipate patient will benefit from follow up [place] at discharge.  PT - End of Session Activity Tolerance: Tolerates 30+ min activity with multiple rests Endurance Deficit: No PT Assessment Rehab Potential (ACUTE/IP ONLY): Good PT Barriers to Discharge: Decreased caregiver support;Home environment access/layout;Lack of/limited family support;Medical stability PT Patient demonstrates impairments in the following area(s): Balance;Motor;Pain;Safety PT Transfers Functional Problem(s): Bed Mobility;Bed to Chair;Car;Furniture;Floor PT Locomotion Functional Problem(s): Ambulation;Stairs PT Plan PT Intensity: Minimum of 1-2 x/day ,45 to 90 minutes PT Frequency: 5 out of 7 days PT Duration Estimated Length of Stay: 7-10 days PT Treatment/Interventions: Ambulation/gait training;Balance/vestibular training;Community reintegration;Discharge planning;Disease management/prevention;DME/adaptive equipment instruction;Functional mobility training;Neuromuscular re-education;Pain management;Patient/family education;Psychosocial support;Stair training;Therapeutic Activities;Therapeutic Exercise;UE/LE Strength taining/ROM;UE/LE Coordination  activities;Visual/perceptual remediation/compensation PT Transfers Anticipated Outcome(s): Supervision PT Locomotion Anticipated Outcome(s): Supervision with RW PT Recommendation Recommendations for Other Services: Neuropsych consult;Therapeutic Recreation consult Therapeutic Recreation Interventions: Pet therapy;Stress management Follow Up Recommendations: Home health PT Patient destination: Home Equipment Recommended: Rolling walker with 5" wheels Equipment Details: pt already owns several RW and powered scooters  Skilled Therapeutic Intervention Evaluation completed (see details above and below) with education on PT POC and goals and individual treatment initiated with focus on functional transfers and gait assessment. Pt received supine in bed, agreeable to PT. No complaints of pain. Bed mobility SBA with use of bedrails and HOB slightly elevated. Stand pivot transfer bed to w/c with min A and v/c for safe transfer technique. Ambulation x 50 ft with RW and min A for balance. Pt exhibits decreased B hip and knee flexion as well as decreased B ankle DF during gait. Pt also exhibits decreased LLE clearance during gait 2/2 leg length discrepancy. Per pt report gait is similar to baseline but she has experienced decreased B knee ROM since CVA. Pt reports only ambulating short distances in her home and utilizing a powered scooter for mobility outside the home. Car transfer with mod A for BLE management. Pt left seated EOB set up for breakfast, needs in reach, bed alarm in place.  PT Evaluation Precautions/Restrictions Precautions Precautions: Fall Restrictions Weight Bearing Restrictions: No Pain Pain Assessment Pain Scale: 0-10 Pain Score: 0-No pain Home Living/Prior Functioning Home Living Available Help at Discharge: Family;Available PRN/intermittently;Neighbor Type of Home: House Home Access: Stairs to enter [employer] of Steps:  4 Entrance Stairs-Rails: Left Home Layout:  One level Bathroom Shower/Tub: Multimedia programmer: Standard Additional Comments: has shower seat, uses walker outside shower to assist with steadying  Lives With: Alone Prior Function Level of Independence: Requires assistive device for independence  Able to Take Stairs?: Yes Driving: No Vocation: Retired Comments: Pt performs all ADLs and IADLs in home. Uses AE for LB dressing. uses RW all the time; neighbor does grocery shopping and driving Vision/Perception  Vision - History Baseline Vision: Wears glasses all the time Patient Visual Report: Blurring of vision Vision - Assessment Eye Alignment: Within Functional Limits Ocular Range of Motion: Within Functional Limits Alignment/Gaze Preference: Within Defined Limits Tracking/Visual Pursuits: Decreased smoothness of horizontal tracking;Decreased smoothness of vertical tracking;Unable to hold eye position out of midline Additional Comments: pt reports blurred vision with eye movement Perception Perception: Within Functional Limits Praxis Praxis: Intact  Cognition Overall Cognitive Status: Within Functional Limits for tasks assessed Arousal/Alertness: Awake/alert Orientation Level: Oriented X4 Attention: Selective Selective Attention: Appears intact Memory: Appears intact Awareness: Impaired Awareness Impairment: Anticipatory impairment Problem Solving: Appears intact Behaviors: Impulsive;Restless Safety/Judgment: Impaired Sensation Sensation Light Touch: Appears Intact Proprioception: Appears Intact Coordination Gross Motor Movements are Fluid and Coordinated: No Fine Motor Movements are Fluid and Coordinated: No Finger Nose Finger Test: slow but WFL bilaterally Heel Shin Test: not tested 2/2 BLE ROM limitations Motor  Motor Motor: Abnormal tone Motor - Skilled Clinical Observations: extensor tone in BLE  Mobility Bed Mobility Bed Mobility: Supine to Sit;Sit to Supine Supine to Sit: Supervision/Verbal  cueing Sit to Supine: Supervision/Verbal cueing Transfers Transfers: Sit to Stand;Stand to Sit;Stand Pivot Transfers Sit to Stand: Minimal Assistance - Patient > 75% Stand to Sit: Minimal Assistance - Patient > 75% Stand Pivot Transfers: Minimal Assistance - Patient > 75% Stand Pivot Transfer Details: Verbal cues for sequencing;Verbal cues for technique;Verbal cues for precautions/safety Locomotion  Gait Gait Distance (Feet): 50 Feet Assistive device: Rolling walker Gait Gait Pattern: Impaired Ankle - Swing Phase- Impaired Gait Pattern: Decreased foot clearance - Left;Decreased foot clearance - Right Knee - Swing Phase- Impaired Gait Pattern: Decreased flexion - Left;Decreased flexion - Right Gait velocity: decreased Stairs / Additional Locomotion Stairs: No Wheelchair Mobility Wheelchair Mobility: No  Trunk/Postural Assessment  Cervical Assessment Cervical Assessment: Exceptions to WFL(forward head) Thoracic Assessment Thoracic Assessment: Exceptions to WFL(kyphotic) Lumbar Assessment Lumbar Assessment: Within Functional Limits Postural Control Postural Control: Within Functional Limits  Balance Balance Balance Assessed: Yes Static Sitting Balance Static Sitting - Balance Support: Feet supported Static Sitting - Level of Assistance: 5: Stand by assistance Dynamic Sitting Balance Dynamic Sitting - Balance Support: Feet supported Dynamic Sitting - Level of Assistance: 5: Stand by assistance Static Standing Balance Static Standing - Balance Support: Bilateral upper extremity supported Static Standing - Level of Assistance: 4: Min assist Dynamic Standing Balance Dynamic Standing - Balance Support: Bilateral upper extremity supported Dynamic Standing - Level of Assistance: 4: Min assist Dynamic Standing - Balance Activities: Reaching for objects Extremity Assessment  RUE Assessment RUE Assessment: Exceptions to [facility] Active Range of Motion (AROM) Comments: shoulder flexion  grossly 80*, elbow and wrist [facility] General Strength Comments: strength grossly 4/5 LUE Assessment LUE Assessment: Exceptions to [facility] Active Range of Motion (AROM) Comments: shoulder flexion grossly 80*, elbow and wrist WFL General Strength Comments: strength grossly 4/5 RLE Assessment RLE Assessment: Exceptions to [facility] Passive Range of Motion (PROM) Comments: limited at baseline but decreased since CVA General Strength Comments: decreased RLE PROM (degrees) Overall PROM Right Lower Extremity:  Deficits;Due to premorbid status RLE Strength Right Hip Flexion: 4/5 Right Knee Flexion: 3/5 Right Knee Extension: 4/5 Right Ankle Dorsiflexion: 3/5 LLE Assessment LLE Assessment: Exceptions to [facility] Passive Range of Motion (PROM) Comments: limited at baseline but decreased since CVA General Strength Comments: decreased LLE PROM (degrees) Overall PROM Left Lower Extremity: Deficits;Due to premorbid status LLE Strength Left Hip Flexion: 3+/5 Left Knee Flexion: 3/5 Left Knee Extension: 4/5 Left Ankle Dorsiflexion: 3/5   See Function Navigator for Current Functional Status.   Refer to Care Plan for Long Term Goals  Recommendations for other services: Neuropsych and Therapeutic Recreation  Pet therapy and Stress management  Discharge Criteria: Patient will be discharged from PT if patient refuses treatment 3 consecutive times without medical reason, if treatment goals not met, if there is a change in medical status, if patient makes no progress towards goals or if patient is discharged from hospital.  The above assessment, treatment plan, treatment alternatives and goals were discussed and mutually agreed upon: by patient  Excell Seltzer, PT, DPT  01/28/2018, 12:11 PM

## 2018-01-28 NOTE — Progress Notes (Signed)
PMR Admission Coordinator Pre-Admission Assessment  Patient: Vicki Stein is an 84 y.o., female MRN: [id number] DOB: May 06, 1928 Height: 5\' 2"  (157.5 cm) Weight: 62 kg (136 lb 11 oz)                                                                                                                                                  Insurance Information HMO:     PPO:      PCP:      IPA:      80/20:     OTHER:  PRIMARY: Medicare A & B      Policy#: [id number]      Subscriber: Self Pre-Cert#: Eligible via [employer] One Portal       Employer: Retired  Benefits:  Phone #: Verified online     Name: [employer] Portal  Eff. Date: 09/06/1994     Deduct: $1364      Out of Pocket Max: N/A      Life Max: N/A CIR: 100%      SNF: 100% days 1-20; 80% days 21-100 Outpatient: 80%     Co-Pay: 20% Home Health: 100%      Co-Pay: $0 DME: 80%     Co-Pay: 20% Providers: Patient's Choice   SECONDARY: GEHA      Policy#: [id number] geha      Subscriber: Spouse Employer: Retired Benefits:  Phone #: [phone]       Emergency [place]    Name Relation Home Work Mobile   Shelby Son [phone]       Current Medical History  Patient Admitting Diagnosis: bilateral CVA  History of Present Illness: Vicki Stein is an 84 year old female with history of hereditary spastic paraparesis, HTN, CVA with left sided weakness, endometrial cancer with recent addition of megace; who was admitted on07/21/2019 with chest pain, blurred vision, dizziness as well as numbness and weakness of bilateral upper extremity.History taken from chart review, neighbor, and patient.MRI of brainreviewed, showing bilateral CVAs. Per report, multiple small acute to early subacute infarct cerebral and cerebellar hemispheres question watershed ischemia due to hypoperfusion. Chest CT showed small right lower lobe PE and she was started on Eliquis for treatment. 2D echo showed EF of 55 to 60%  with no wall abnormality, mildly calcified mitral valve and mildly increased pulmonary artery pressures. Dr. Leonie Man felt that stroke likely embolic cardioembolic versus due to hypercoagulable state from cancer versus maranticendocarditis. Patient currently with bilateral upper extremity weakness with poor safety awareness, instability, and impulsivity with high risk for falls. CIR was recommended due to functional decline and patient admitted 01/27/18.   Past Medical History      Past Medical History:  Diagnosis Date  . Arthritis    all over  . Back pain   .  Blood dyscrasia    on blood thiners  . Borderline diabetes   . Cerebrovascular disease   . Coronary artery disease    a. LHC (9/14):  pLM 30, oD1 50, D2 40-50, pRI 30, mCFX 30, mRCA 30, EF 65%  . GERD (gastroesophageal reflux disease)   . Gout   . Hemorrhoids   . High cholesterol   . History of kidney stones    60 years ago  . Hyperlipemia   . Hypertension   . Migraines   . Movement disorder   . Paraplegia, spastic congenital ([place])    inherited .   Marland Kitchen Peripheral vascular disease (HCC)    poor circulation lower legs  . Pneumonia    30 years ago  . Pre-diabetes    borderline  . Reflux   . Stroke [facility])    " 5 mini strokes"    Family History  family history includes Heart Problems in her father and mother; Heart attack in her son.  Prior Rehab/Hospitalizations:  Has the patient had major surgery during 100 days prior to admission? No  Current Medications   Current Facility-Administered Medications:  .  acetaminophen (TYLENOL) tablet 650 mg, 650 mg, Oral, Q6H PRN **OR** [DISCONTINUED] acetaminophen (TYLENOL) suppository 650 mg, 650 mg, Rectal, Q6H PRN, Lady Deutscher, MD .  acetaminophen-codeine (TYLENOL #3) 300-30 MG per tablet 1-2 tablet, 1-2 tablet, Oral, Q4H PRN, Cherene Altes, MD, 2 tablet at 01/27/18 [phone] .  amLODipine (NORVASC) tablet 2.5 mg, 2.5 mg, Oral, BID PRN,  Lady Deutscher, MD .  apixaban (ELIQUIS) tablet 10 mg, 10 mg, Oral, BID, 10 mg at 01/27/18 0931 **FOLLOWED BY** [START ON 01/31/2018] apixaban (ELIQUIS) tablet 5 mg, 5 mg, Oral, BID, Evangeline Gula, Wyatt Haste, MD .  atorvastatin (LIPITOR) tablet 40 mg, 40 mg, Oral, QPM, Lady Deutscher, MD, 40 mg at 01/26/18 1721 .  diclofenac sodium (VOLTAREN) 1 % transdermal gel 2 g, 2 g, Topical, QID, Evangeline Gula, Wyatt Haste, MD, 2 g at 01/27/18 1403 .  docusate sodium (COLACE) capsule 100 mg, 100 mg, Oral, BID, Lady Deutscher, MD, 100 mg at 01/27/18 0930 .  fluticasone (FLONASE) 50 MCG/ACT nasal spray 2 spray, 2 spray, Each Nare, Daily, Lady Deutscher, MD, 2 spray at 01/27/18 0934 .  lisinopril (PRINIVIL,ZESTRIL) tablet 40 mg, 40 mg, Oral, q morning - 10a, Lady Deutscher, MD, 40 mg at 01/27/18 0932 .  meclizine (ANTIVERT) tablet 25 mg, 25 mg, Oral, BID, Lady Deutscher, MD, 25 mg at 01/27/18 6629 .  megestrol (MEGACE) tablet 40 mg, 40 mg, Oral, TID, Cherene Altes, MD, 40 mg at 01/27/18 0934 .  ondansetron (ZOFRAN) tablet 4 mg, 4 mg, Oral, Q6H PRN **OR** ondansetron (ZOFRAN) injection 4 mg, 4 mg, Intravenous, Q6H PRN, Lady Deutscher, MD .  oxyCODONE (Oxy IR/ROXICODONE) immediate release tablet 5 mg, 5 mg, Oral, Q4H PRN, Cherene Altes, MD .  pantoprazole (PROTONIX) EC tablet 40 mg, 40 mg, Oral, Daily, Lady Deutscher, MD, 40 mg at 01/27/18 0931 .  sodium chloride flush (NS) 0.9 % injection 3 mL, 3 mL, Intravenous, Q12H, Lady Deutscher, MD, 3 mL at 01/27/18 4765  Patients Current Diet:       Diet Order           Diet Heart Room service appropriate? Yes; Fluid consistency: Thin  Diet effective now          Precautions / Restrictions Precautions Precautions: Fall Restrictions Weight Bearing Restrictions: No  Has the patient had 2 or more falls or a fall with injury in the past year?Yes, 2 falls without injuries while patient was vacuuming and in the bathroom, but she  reports they were both prior to her using a walker.  Prior Activity Level Household: Patient was mostly household prior to admission and had given up driving about 1.5 years ago.  She maintained her independence, which is important to her but got assist with some household and yard tasks and allowed her neighbor to drive her when she needs to run errands.    Home Assistive Devices / Equipment Home Assistive Devices/Equipment: Environmental consultant (specify type) Home Equipment: Walker - 2 wheels, Shower seat, Adaptive equipment, Bedside commode(3 wheeled walker)  Prior Device Use: Indicate devices/aids used by the patient prior to current illness, exacerbation or injury? Walker, 2 wheeled in the house and Rollator out in the community or yard.   Prior Functional Level Prior Function Level of Independence: Independent with assistive device(s) Comments: Pt performs all ADLs and IADLs in home. Uses AE for LB dressing. uses RW all the time; neighbor does grocery shopping and driving  Self Care: Did the patient need help bathing, dressing, using the toilet or eating? Independent  Indoor Mobility: Did the patient need assistance with walking from room to room (with or without device)? Independent  Stairs: Did the patient need assistance with internal or external stairs (with or without device)? Independent  Functional Cognition: Did the patient need help planning regular tasks such as shopping or remembering to take medications? Independent  Current Functional Level Cognition  Arousal/Alertness: Awake/alert Overall Cognitive Status: Impaired/Different from baseline Current Attention Level: Selective Orientation Level: Oriented X4 Following Commands: Follows multi-step commands inconsistently Safety/Judgement: Decreased awareness of safety, Decreased awareness of deficits General Comments: Pt tangential with all activities. She is hard of hearing impacting her ability to follow directions at  times. Poor safety awareness noted and decreased awareness in general.  Attention: Selective Selective Attention: Appears intact Memory: Appears intact Awareness: Impaired Awareness Impairment: Anticipatory impairment Problem Solving: Appears intact Behaviors: Impulsive, Restless(she describes this as "being spunky" at baseline) Safety/Judgment: Impaired    Extremity Assessment (includes Sensation/Coordination)  Upper Extremity Assessment: Defer to OT evaluation, Generalized weakness, RUE deficits/detail, LUE deficits/detail RUE Deficits / Details: Pt reporting numbness in right hand. Pt with generalized weakness for BUEs and decreased FM skills as seen during ADLs and testing.  RUE Sensation: (numbness) RUE Coordination: decreased fine motor, decreased gross motor LUE Deficits / Details: Pt with generalized weakness for BUEs and decreased FM skills as seen during ADLs and testing.  LUE Coordination: decreased fine motor, decreased gross motor  Lower Extremity Assessment: Generalized weakness(baseline deficit) RLE Deficits / Details: stiff/spastic LE even with mobility LLE Deficits / Details: reports L LE longer than R LE; stiff/spastic LE even with mobility    ADLs  Overall ADL's : Needs assistance/impaired Eating/Feeding: Set up, Sitting Grooming: Oral care, Standing Grooming Details (indicate cue type and reason): Min A for standing balance and safety at sink Upper Body Bathing: Sitting, Set up, Supervision/ safety Lower Body Bathing: Minimal assistance, Sit to/from stand Upper Body Dressing : Set up, Supervision/safety, Sitting Lower Body Dressing: Minimal assistance, Sit to/from stand Lower Body Dressing Details (indicate cue type and reason): Pt with increased difficulty bringing ankles to knees due to tightness.  Toilet Transfer: Minimal assistance, Cueing for safety, Ambulation(simulated ) Toilet Transfer Details (indicate cue type and reason): Min A for safety and  requiring cues not to sit prematurely  Functional mobility during ADLs: Minimal assistance, +2 for safety/equipment, Rolling walker, Cueing for safety General ADL Comments: Pt highly unsafe at this time. She is very motivated to complete tasks independently.     Mobility  Overal bed mobility: Needs Assistance Bed Mobility: Supine to Sit Supine to sit: Min guard General bed mobility comments: use of rail and HOB elevated slightly; increased time and effort     Transfers  Overall transfer level: Needs assistance Equipment used: Rolling walker (2 wheeled) Transfers: Sit to/from Stand Sit to Stand: Min assist General transfer comment: cues for safe hand placement and assist to steady    Ambulation / Gait / Stairs / Wheelchair Mobility  Ambulation/Gait Ambulation/Gait assistance: Min assist, Mod assist Gait Distance (Feet): 100 Feet Assistive device: Rolling walker (2 wheeled) Gait Pattern/deviations: Step-to pattern, Decreased step length - left, Decreased stride length, Decreased dorsiflexion - right, Decreased dorsiflexion - left, Trunk flexed, Wide base of support General Gait Details: pt with increasing difficulty with distance and requires increased assistance for safety; pt with difficulty clearing feet last ~62ft  Gait velocity: decreased Stairs: Yes Stairs assistance: Min assist Stair Management: Two rails, Step to pattern, Forwards Number of Stairs: 2 General stair comments: cues for safety with foot placement; assistance for balance; pt reliant on bilat UE support; pt with more difficulty descending steps due to limited bilat knee ROM     Posture / Balance Balance Overall balance assessment: Needs assistance Sitting-balance support: No upper extremity supported, Feet supported Sitting balance-Leahy Scale: Good Standing balance support: Bilateral upper extremity supported, During functional activity Standing balance-Leahy Scale: Poor Standing balance comment: Relies on  external assist or RW for support.     Special needs/care consideration BiPAP/CPAP: No CPM: No Continuous Drip IV: No Dialysis: No        Life Vest: No Oxygen: No Special Bed: No Trach Size: No Wound Vac (area): No       Skin: Dry with ecchymosis to bilateral upper and lower extremities                                Bowel mgmt: Continent, last BM 01/27/18 Bladder mgmt: Continent with urgency same as prior to admission and patient wore depends at home Diabetic mgmt: No     Previous Home Environment Living Arrangements: Alone  Lives With: Alone Available Help at Discharge: Family, Available PRN/intermittently Type of Home: House Home Layout: One level Home Access: Stairs to enter Entrance Stairs-Rails: Right Entrance Stairs-Number of Steps: 4 Bathroom Shower/Tub: Multimedia programmer: Two Strike: No Additional Comments: has walker outside shower to assist with steadying  Discharge Living Setting Plans for Discharge Living Setting: Patient's home, Lives with (comment)(Alone; however, son, Wille Glaser and his wife to assist after D/C.) Type of Home at Discharge: House Discharge Home Layout: One level Discharge Home Access: Stairs to enter Entrance Stairs-Rails: Right Entrance Stairs-Number of Steps: 4 Discharge Bathroom Shower/Tub: Walk-in shower Discharge Bathroom Toilet: Standard Discharge Bathroom Accessibility: Yes How Accessible: Accessible via walker Does the patient have any problems obtaining your medications?: No  Social/Family/Support Systems Patient Roles: Parent, Other (Comment)(Grandparent, Aunt, Neighbor, and friend ) Sport and exercise psychologist Information: Son, Drema Eddington Anticipated Caregiver: Son and daugher-in-law Anticipated Caregiver's Contact Information: Jon's [phone] Ability/Limitations of Caregiver: None, but both say that her ultimate goal is to be Mod I with intermittent assist  Caregiver Availability: 24/7 Discharge Plan  Discussed with Primary Caregiver: Yes Is Caregiver In  Agreement with Plan?: Yes Does Caregiver/Family have Issues with Lodging/Transportation while Pt is in Rehab?: No  Goals/Additional Needs Patient/Family Goal for Rehab: PT/OT: Supervision  Expected length of stay: 13-17 days  Cultural Considerations: None Dietary Needs: Heart Healthy diet restrictions  Equipment Needs: TBD Pt/Family Agrees to Admission and willing to participate: Yes Program Orientation Provided & Reviewed with Pt/Caregiver Including Roles  & Responsibilities: Yes Additional Information Needs: Patient has very supportive neighbors that also help her at home Information Needs to be Provided By: Team FYI  Decrease burden of Care through IP rehab admission: No  Possible need for SNF placement upon discharge: Potentially  Patient Condition: This patient's medical and functional status has changed since the consult dated 01/26/18 in which the Rehabilitation Physician determined and documented that the patient was potentially appropriate for intensive rehabilitative care in an inpatient rehabilitation facility. Issues have been addressed and update has been discussed with Dr. Posey Pronto and patient now appropriate for inpatient rehabilitation. Will admit to inpatient rehab today.   Preadmission Screen Completed By:  Gunnar Fusi, 01/27/2018 2:13 PM ______________________________________________________________________   Discussed status with Dr. Posey Pronto on 01/27/18 at 1428 and received telephone approval for admission today.  Admission Coordinator:  Gunnar Fusi, time 1428/Date 01/27/18         Revision History

## 2018-01-29 ENCOUNTER — Inpatient Hospital Stay (HOSPITAL_COMMUNITY): Payer: Medicare Other | Admitting: Physical Therapy

## 2018-01-29 ENCOUNTER — Inpatient Hospital Stay (HOSPITAL_COMMUNITY): Payer: Medicare Other | Admitting: Occupational Therapy

## 2018-01-29 MED ORDER — BACLOFEN 5 MG HALF TABLET
5.0000 mg | ORAL_TABLET | Freq: Three times a day (TID) | ORAL | Status: DC
  Administered 2018-01-29 – 2018-02-04 (×19): 5 mg via ORAL
  Filled 2018-01-29 (×19): qty 1

## 2018-01-29 NOTE — Plan of Care (Signed)
  Problem: Consults Goal: RH STROKE PATIENT EDUCATION Description See Patient Education module for education specifics  Outcome: Progressing   Problem: RH SAFETY Goal: RH STG ADHERE TO SAFETY PRECAUTIONS W/ASSISTANCE/DEVICE Description STG Adhere to Safety Precautions With min Assistance and appropriate assistive Device.  Outcome: Progressing Goal: RH STG DECREASED RISK OF FALL WITH ASSISTANCE Description STG Decreased Risk of Fall With min Assistance.  Outcome: Progressing   Problem: RH PAIN MANAGEMENT Goal: RH STG PAIN MANAGED AT OR BELOW PT'S PAIN GOAL Description <3 on a 0-10 pain scale.  Outcome: Progressing

## 2018-01-29 NOTE — Progress Notes (Addendum)
Social Work Assessment and Plan  Patient Details  Name: Vicki Stein MRN: [id number] Date of Birth: 1928/02/20  Today's Date: 01/29/2018  Problem List:  Patient Active Problem List   Diagnosis Date Noted  . Embolic stroke ([place]) 44/07/270  . Benign essential HTN   . Hearing loss   . Prediabetes   . Acute blood loss anemia   . Cerebral embolism with cerebral infarction 01/25/2018  . CVA (cerebral vascular accident) ([place]) 01/25/2018  . Generalized weakness   . Blurred vision, bilateral   . Elevated troponin   . Endometrial adenocarcinoma ([place]) 01/24/2018  . Coronary artery disease 01/24/2018  . Cerebrovascular disease in cancer patient ([place]) 01/24/2018  . Pulmonary embolism ([place]) 01/24/2018  . Chest pain 03/25/2013  . Dyslipidemia 03/25/2013  . HTN (hypertension) 03/25/2013  . Gout 03/25/2013  . Unstable angina ([place]) 03/25/2013  . Hereditary spastic paraparesis ([place]) 01/15/2013  . Paraplegia, spastic congenital [facility])    Past Medical History:  Past Medical History:  Diagnosis Date  . Arthritis    all over  . Back pain   . Blood dyscrasia    on blood thiners  . Borderline diabetes   . Cerebrovascular disease   . Coronary artery disease    a. LHC (9/14):  pLM 30, oD1 50, D2 40-50, pRI 30, mCFX 30, mRCA 30, EF 65%  . GERD (gastroesophageal reflux disease)   . Gout   . Hemorrhoids   . High cholesterol   . History of kidney stones    60 years ago  . Hyperlipemia   . Hypertension   . Migraines   . Movement disorder   . 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   Marland Kitchen Peripheral vascular disease (HCC)    poor circulation lower legs  . Pneumonia    30 years ago  . Pre-diabetes    borderline  . Reflux   . Stroke [facility])    " 5 mini strokes"   Past Surgical History:  Past Surgical History:  Procedure Laterality Date  . APPENDECTOMY    . CATARACT EXTRACTION W/ INTRAOCULAR LENS  IMPLANT, BILATERAL    . CHOLECYSTECTOMY    . COLONOSCOPY    . DILATATION &  CURETTAGE/HYSTEROSCOPY WITH MYOSURE N/A 06/25/2017   Procedure: DILATATION & CURETTAGE/HYSTEROSCOPY;  Surgeon: Janyth Pupa, DO;  Location: [facility];  Service: Gynecology;  Laterality: N/A;  . DILATION AND CURETTAGE OF UTERUS    . ESOPHAGOGASTRODUODENOSCOPY N/A 11/14/2015   Procedure: ESOPHAGOGASTRODUODENOSCOPY (EGD);  Surgeon: Laurence Spates, MD;  Location: [facility] ENDOSCOPY;  Service: Endoscopy;  Laterality: N/A;  . HEMORRHOID SURGERY    . LEFT HEART CATHETERIZATION WITH CORONARY ANGIOGRAM N/A 03/25/2013   Procedure: LEFT HEART CATHETERIZATION WITH CORONARY ANGIOGRAM;  Surgeon: Burnell Blanks, MD;  Location: [facility] CATH LAB;  Service: Cardiovascular;  Laterality: N/A;  . ROTATOR CUFF REPAIR    . TONSILLECTOMY    . TUBAL LIGATION     Social History:  reports that she has never smoked. She has never used smokeless tobacco. She reports that she does not drink alcohol or use drugs.  Family / Support Systems Marital Status: Widow/Widower How Long?: 22 years Patient Roles: Parent, Other (Comment)(grandparent; aunt; neighbor; friend) Children: Vicki Stein - son - [phone] Anticipated Caregiver: Son and daugher-in-law Ability/Limitations of Caregiver: None, but pt and son both say that her ultimate goal is to be Mod I with intermittent assist  Caregiver Availability: 24/7 Family Dynamics: supportive family  Social History Preferred language: English Religion:  Baptist Read: Yes Write: Yes Employment Status: Retired Public relations account executive Issues: none reported Guardian/Conservator: N/A - MD has determined that pt is capable of making her own decisions.   Abuse/Neglect Abuse/Neglect Assessment Can Be Completed: Yes Physical Abuse: Denies Verbal Abuse: Denies Sexual Abuse: Denies Exploitation of patient/patient's resources: Denies Self-Neglect: Denies  Emotional Status Pt's affect, behavior and adjustment status: Pt is in good spirits and motivated to get better.  Son reports  that pt is "tough" and she does not get down or depressed, she just keeps going. Recent Psychosocial Issues: Pt with recent cancer diagnosis. Psychiatric History: none reported Substance Abuse History: none reported  Patient / Family Perceptions, Expectations & Goals Pt/Family understanding of illness & functional limitations: Pt/son have a good understanding of pt's limitations and functional status. Premorbid pt/family roles/activities: Pt stopped driving about a year and a half ago.  Neighbor drives and they go to the grocery store together.  Pt still keeps up interior of home, but has hired someone to do the [employer]. Anticipated changes in roles/activities/participation: Pt would like to resume activities as she is able. Pt/family expectations/goals: Pt wants to get her legs moving and to regain her independence.   Community [employer] Agencies: None Premorbid Home Care/DME Agencies: Other (Comment)(Pt had HH after her shoulder surgery in 2002, but cannot remember the agency.  She has 4 or 5 walkers, 2 power wheelchairs, 2 scooters, shower chair, and 2 BSCs.) Transportation available at discharge: family Resource referrals recommended: Support group (specify)(Stroke support group)  Discharge Planning Living Arrangements: Alone Support Systems: Children, Other relatives, Friends/neighbors Type of Residence: Private residence Insurance Resources: [employer], Multimedia programmer (specify)(GEHA) Financial Resources: Radio broadcast assistant Screen Referred: No Money Management: Patient Does the patient have any problems obtaining your medications?: No Home Management: Pt was keeping up the inside of her home and hired help for outside chores. Patient/Family Preliminary Plans: Pt plans to return to her home with her family initially staying with her until she can return to her independence. Social Work Anticipated Follow Up Needs: HH/OP, Support Group Expected length of stay: 7 to  10 days  Clinical Impression CSW met with pt and then spoke with her son via telephone to introduce self and role of CSW, as well as to complete assessment.  Pt was positive and motivated to get better.  She's had a chronic disability her entire life and raised two children with disabilities, so her son reports she is "tough" and gets through any challenges.  She's determined to get back to living independently, but reluctantly admitted that having someone there initially is the right thing to do.  Son stated that he and other family members will take turns being with her until she can be home safely by herself.  CSW explained to pt/son that team could not set d/c date yet, but expected her to be on CIR for 7 to 10 days.  CSW will notify them on d/c date next week.  No current concerns/questions/needs.  CSW will continue to follow and assist as needed.  [place] Canty, Silvestre Mesi 01/29/2018, 2:14 PM

## 2018-01-29 NOTE — Progress Notes (Signed)
Physical Therapy Session Note  Patient Details  Name: Vicki Stein MRN: [id number] Date of Birth: 1927/10/14  Today's Date: 01/29/2018 PT Individual Time: 1045-1200 PT Individual Time Calculation (min): 75 min   Short Term Goals: Week 1:  PT Short Term Goal 1 (Week 1): =LTG due to ELOS  Skilled Therapeutic Interventions/Progress Updates:    Pt received seated in w/c in room, agreeable to PT. No complaints of pain. SPT w/c to mat table with RW and CGA. Supine to/from sit with SBA. Supine BLE stretches: HS, hip flexors, quads 5 x 60 sec each. Ambulation 2 x 60 ft with RW and min A for balance, improved BLE clearance that decreases on LLE with onset of fatigue. Pt also exhibits decreased L hip flexion with gait. Ascend/descend 4 3" stairs with 2 handrails and mod A, v/c for safe gait pattern and to make sure foot makes it all the way onto the step. Seated BLE AAROM therex x 10 reps. Toilet transfer with CGA, pt is able to complete 3/3 toileting steps. Pt exhibits decreased safety awareness and needs v/c for safe RW management when washing hands at sink after toileting. Pt left seated EOB set up for lunch, needs in reach, bed alarm in place.  Therapy Documentation Precautions:  Precautions Precautions: Fall Restrictions Weight Bearing Restrictions: No  See Function Navigator for Current Functional Status.   Therapy/Group: Individual Therapy  Excell Seltzer, PT, DPT  01/29/2018, 12:27 PM

## 2018-01-29 NOTE — Progress Notes (Signed)
Subjective/Complaints:  No issues overnite, Min/standby for ADL.  Pt states she has not tried medication to reduce spasticity  ROS- denies CP, SOB, N/V/D  Objective: Vital Signs: Blood pressure (!) 144/68, pulse 74, temperature 98.3 F (36.8 C), temperature source Oral, resp. rate 16, height '5\' 2"'  (1.575 m), weight 62.2 kg (137 lb 2 oz), SpO2 100 %. No results found. Results for orders placed or performed during the hospital encounter of 01/27/18 (from the past 72 hour(s))  CBC WITH DIFFERENTIAL     Status: Abnormal   Collection Time: 01/28/18  6:41 AM  Result Value Ref Range   WBC 8.4 4.0 - 10.5 K/uL   RBC 3.77 (L) 3.87 - 5.11 MIL/uL   Hemoglobin 11.3 (L) 12.0 - 15.0 g/dL   HCT 36.2 36.0 - 46.0 %   MCV 96.0 78.0 - 100.0 fL   MCH 30.0 26.0 - 34.0 pg   MCHC 31.2 30.0 - 36.0 g/dL   RDW 13.7 11.5 - 15.5 %   Platelets 207 150 - 400 K/uL   Neutrophils Relative % 65 %   Neutro Abs 5.5 1.7 - 7.7 K/uL   Lymphocytes Relative 20 %   Lymphs Abs 1.7 0.7 - 4.0 K/uL   Monocytes Relative 11 %   Monocytes Absolute 0.9 0.1 - 1.0 K/uL   Eosinophils Relative 3 %   Eosinophils Absolute 0.3 0.0 - 0.7 K/uL   Basophils Relative 1 %   Basophils Absolute 0.0 0.0 - 0.1 K/uL   Immature Granulocytes 0 %   Abs Immature Granulocytes 0.0 0.0 - 0.1 K/uL    Comment: Performed at [facility], [address], [place], [place] [address]  Comprehensive metabolic panel     Status: Abnormal   Collection Time: 01/28/18  6:41 AM  Result Value Ref Range   Sodium 139 135 - 145 mmol/L   Potassium 4.4 3.5 - 5.1 mmol/L   Chloride 108 98 - 111 mmol/L   CO2 23 22 - 32 mmol/L   Glucose, Bld 108 (H) 70 - 99 mg/dL   BUN 24 (H) 8 - 23 mg/dL   Creatinine, Ser 0.94 0.44 - 1.00 mg/dL   Calcium 9.1 8.9 - 10.3 mg/dL   Total Protein 6.0 (L) 6.5 - 8.1 g/dL   Albumin 3.2 (L) 3.5 - 5.0 g/dL   AST 19 15 - 41 U/L   ALT 16 0 - 44 U/L   Alkaline Phosphatase 115 38 - 126 U/L   Total Bilirubin 0.6 0.3 - 1.2 mg/dL   GFR  calc non Af Amer 53 (L) >60 mL/min   GFR calc Af Amer >60 >60 mL/min    Comment: (NOTE) The eGFR has been calculated using the CKD EPI equation. This calculation has not been validated in all clinical situations. eGFR's persistently <60 mL/min signify possible Chronic Kidney Disease.    Anion gap 8 5 - 15    Comment: Performed at [facility] [address] [place], [place] [address]     HEENT: normal Cardio: RRR and no murmur Resp: CTA B/L and Unlabored GI: BS positive and NT, ND Extremity:  No Edema Skin:   Intact Neuro: Alert/Oriented, Anxious, Normal Sensory, Abnormal Motor 4/5 RUE, 5/5 LUE, 3- Bilateral KE ADF, increased tone in Bilateral quads and hamstrings with co contraction and Abnormal FMC Ataxic/ dec FMC Musc/Skel:  Other no pain with UE or LE movement Gen NAD   Assessment/Plan: 1. Functional deficits secondary to RUE weakness and B LE spastic weakness  which require 3+ hours per day of interdisciplinary therapy in a comprehensive inpatient rehab setting. Physiatrist is providing close team supervision and 24 hour management of active medical problems listed below. Physiatrist and rehab team continue to assess barriers to discharge/monitor patient progress toward functional and medical goals. FIM: Function - Bathing Position: Shower Body parts bathed by patient: Right arm, Left arm, Chest, Abdomen, Front perineal area, Buttocks, Right upper leg, Left upper leg Bathing not applicable: Right lower leg, Left lower leg, Back Assist Level: Touching or steadying assistance(Pt > 75%)  Function- Upper Body Dressing/Undressing What is the patient wearing?: Bra, Pull over shirt/dress Bra - Perfomed by patient: Thread/unthread right bra strap, Thread/unthread left bra strap, Hook/unhook bra (pull down sports bra) Pull over shirt/dress - Perfomed by patient: Thread/unthread right sleeve, Thread/unthread left sleeve, Put head through opening, Pull shirt over trunk Assist  Level: Set up Set up : To obtain clothing/put away Function - Lower Body Dressing/Undressing What is the patient wearing?: Underwear, Pants, Socks, Shoes Position: Wheelchair/chair at sink Underwear - Performed by patient: Pull underwear up/down Underwear - Performed by helper: Thread/unthread right underwear leg, Thread/unthread left underwear leg Pants- Performed by patient: Pull pants up/down Pants- Performed by helper: Thread/unthread right pants leg, Thread/unthread left pants leg Socks - Performed by patient: Don/doff right sock Socks - Performed by helper: Don/doff left sock Shoes - Performed by patient: Don/doff right shoe Shoes - Performed by helper: Don/doff left shoe, Fasten right, Fasten left Assist for footwear: Maximal assist Assist for lower body dressing: (Max assist)  Function - Toileting Toileting steps completed by patient: Adjust clothing prior to toileting, Performs perineal hygiene, Adjust clothing after toileting Toileting Assistive Devices: Grab bar or rail Assist level: Touching or steadying assistance (Pt.75%)  Function - Air cabin crew transfer assistive device: Grab bar, Walker Assist level to toilet: Touching or steadying assistance (Pt > 75%) Assist level from toilet: Touching or steadying assistance (Pt > 75%)  Function - Chair/bed transfer Chair/bed transfer method: Ambulatory Chair/bed transfer assist level: Touching or steadying assistance (Pt > 75%) Chair/bed transfer assistive device: Walker Chair/bed transfer details: Verbal cues for sequencing, Verbal cues for technique, Verbal cues for precautions/safety  Function - Locomotion: Wheelchair Will patient use wheelchair at discharge?: No Max wheelchair distance: 125 ft Assist Level: Touching or steadying assistance (Pt > 75%) Assist Level: Touching or steadying assistance (Pt > 75%) Function - Locomotion: Ambulation Assistive device: Walker-rolling Max distance: 40 ft Assist level:  Touching or steadying assistance (Pt > 75%) Assist level: Touching or steadying assistance (Pt > 75%) Assist level: Touching or steadying assistance (Pt > 75%) Walk 150 feet activity did not occur: Safety/medical concerns Walk 10 feet on uneven surfaces activity did not occur: Safety/medical concerns  Function - Comprehension Comprehension: Auditory Comprehension assist level: Follows basic conversation/direction with extra time/assistive device  Function - Expression Expression: Verbal Expression assist level: Expresses basic needs/ideas: With extra time/assistive device  Function - Social Interaction Social Interaction assist level: Interacts appropriately 90% of the time - Needs monitoring or encouragement for participation or interaction.  Function - Problem Solving Problem solving assist level: Solves basic 90% of the time/requires cueing < 10% of the time  Function - Memory Memory assist level: Recognizes or recalls 90% of the time/requires cueing < 10% of the time Patient normally able to recall (first 3 days only): Current season, That he or she is in a hospital  Medical Problem List and Plan: 1.  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  Evals today with PT, OT 2.  DVT Prophylaxis/Anticoagulation: Pharmaceutical: Other (comment)--Eliquis 3. Pain Management: N/A 4. Mood: LCSW to follow for evaluation and support.  5. Neuropsych: This patient is capable of making decisions on her own behalf. 6. Skin/Wound Care: routine pressure relief measures.  7. Fluids/Electrolytes/Nutrition: Monitor I/O.  Normal CMET except low alb , add supplement 8. HTN: Monitor BP bid. Continue Lisinopril daily- good control Vitals:   01/28/18 2128 01/29/18 0515  BP: (!) 139/57 (!) 144/68  Pulse: 64 74  Resp: 16 16  Temp: 97.8 F (36.6 C) 98.3 F (36.8 C)   SpO2: 100% 100%  9. Endometrial cancer: Treated non surgically--on megace tid.  Per pt her Gyn would like pt to have hysterectomy will discuss ~42mopost CVA 10. Anemia: Continue to monitor for stability.  11.  Spastic paraparesis will trial baclofen low dose   LOS (Days) 2 A FACE TO FACE EVALUATION WAS PERFORMED  ACharlett Blake7/25/2019, 8:48 AM

## 2018-01-29 NOTE — Progress Notes (Signed)
Occupational Therapy Session Note  Patient Details  Name: Vicki Stein MRN: [id number] Date of Birth: [birth year]  Today's Date: 01/29/2018 OT Individual Time: 8891-6945 and 0388-8280 OT Individual Time Calculation (min): 60 min and 55 min   Short Term Goals: Week 1:  OT Short Term Goal 1 (Week 1): STG = LTGs due to ELOS  Skilled Therapeutic Interventions/Progress Updates:    1) Treatment session with focus on ADL retraining with functional transfers and use of AE to increase independence with LB dressing.  Pt received supine in bed requesting to bathe at shower level this session.  Ambulated to bathroom with RW and contact guard with improved ability to advance LLE, completed toileting with contact guard while completing clothing management.  Bathing completed at sit > stand level in room shower with contact guard when standing for perineal hygiene.  Completed dressing with use of reacher to don underwear and pants, still requiring assistance to thread pant legs due to weakness and difficulty picking up LLE.  Pt attributes difficulty to height of w/c (vs toilet at home) - plan to attempt dressing from toilet tomorrow.  Pt completed oral care in standing with supervision.  Assistance provided to cross LLE over Rt knee to allow pt to don sock.  Once leg crossed pt able to maintain position and successfully don sock.  Donned shoes in same manner.  Pt tied shoes by bending forward.  Pt expressing desire to go home asap but also understanding of current level and plan to address deficits to increase independence and safety upon d/c home.  2) Treatment session with focus on bilateral hip flexor mobility.  Pt ambulated to toilet with RW with contact guard and min cues to "pick up" BLE when ambulating.  Pt completing toilet tasks with supervision.  Attempted to engage in w/c propulsion with BLE to focus on hip flexion, with pt unable to complete.  Engaged in seated exercise to focus on hip flexors to  improve ability to cross legs over opposite knee to engage in LB dressing tasks.  Pt continues to demonstrate difficulty picking up feet from floor even in sitting, often utilizing UE to manually pick up leg.  Educated pt on sock aid with pt able to don socks with increased ease and independence.  Educated on use of reacher and shoe horn to don shoes, however pt with increased difficulty due to spasticity, therefore plan to continue to complete shoes by crossing leg over opposite knee and continue to target flexibility at hips.  Pt returned to room and ambulated back to bed with RW and contact guard with min cues for positioning of RW.    Therapy Documentation Precautions:  Precautions Precautions: Fall Restrictions Weight Bearing Restrictions: No Pain: Pain Assessment Pain Scale: 0-10 Pain Score: 7  Pain Type: Acute pain Pain Location: Abdomen Pain Orientation: Left  See Function Navigator for Current Functional Status.   Therapy/Group: Individual Therapy  Simonne Come 01/29/2018, 10:41 AM

## 2018-01-30 ENCOUNTER — Inpatient Hospital Stay (HOSPITAL_COMMUNITY): Payer: Medicare Other | Admitting: Physical Therapy

## 2018-01-30 ENCOUNTER — Inpatient Hospital Stay (HOSPITAL_COMMUNITY): Payer: Medicare Other | Admitting: Occupational Therapy

## 2018-01-30 MED ORDER — MUSCLE RUB 10-15 % EX CREA
TOPICAL_CREAM | CUTANEOUS | Status: DC | PRN
  Administered 2018-02-03: via TOPICAL
  Filled 2018-01-30: qty 85

## 2018-01-30 MED ORDER — MUSCLE RUB 10-15 % EX CREA
TOPICAL_CREAM | Freq: Two times a day (BID) | CUTANEOUS | Status: DC
  Administered 2018-02-04: 08:00:00 via TOPICAL
  Filled 2018-01-30: qty 85

## 2018-01-30 MED ORDER — TROLAMINE SALICYLATE 10 % EX CREA: TOPICAL_CREAM | Freq: Two times a day (BID) | CUTANEOUS | Status: DC

## 2018-01-30 MED ORDER — TROLAMINE SALICYLATE 10 % EX CREA: TOPICAL_CREAM | CUTANEOUS | Status: DC | PRN

## 2018-01-30 NOTE — IPOC Note (Signed)
Overall Plan of Care [facility]) Patient Details Name: Vicki Stein MRN: [id number] DOB: September 28, 1927  Admitting Diagnosis: <principal problem not specified>  Hospital Problems: Active Problems:   Embolic stroke [facility])     Functional Problem List: Nursing Medication Management, Endurance, Pain, Perception, Safety, Skin Integrity  PT Balance, Motor, Pain, Safety  OT Balance, Endurance, Motor, Pain, Safety, Vision  SLP    TR         Basic ADL's: OT Grooming, Bathing, Dressing, Toileting     Advanced  ADL's: OT Simple Meal Preparation     Transfers: PT Bed Mobility, Bed to Chair, Car, Furniture, Floor  OT Toilet, Tub/Shower     Locomotion: PT Ambulation, Stairs     Additional Impairments: OT None  SLP        TR      Anticipated Outcomes Item Anticipated Outcome  Self Feeding    Swallowing      Basic self-care  Supervision  Insurance underwriter Transfers Supervision  Bowel/Bladder     Transfers  Supervision  Locomotion  Supervision with RW  Communication     Cognition     Pain  <3 on a 0-10 pain scale  Safety/Judgment  min assist and call appropriately   Therapy Plan: PT Intensity: Minimum of 1-2 x/day ,45 to 90 minutes PT Frequency: 5 out of 7 days PT Duration Estimated Length of Stay: 7-10 days OT Intensity: Minimum of 1-2 x/day, 45 to 90 minutes OT Frequency: 5 out of 7 days OT Duration/Estimated Length of Stay: 7-10 days      Team Interventions: Nursing Interventions Patient/Family Education, Pain Management, Discharge Planning, Psychosocial Support, Disease Management/Prevention, Medication Management  PT interventions Ambulation/gait training, Training and development officer, Community reintegration, Discharge planning, Disease management/prevention, DME/adaptive equipment instruction, Functional mobility training, Neuromuscular re-education, Pain management, Patient/family education, Psychosocial support, Stair training, Therapeutic  Activities, Therapeutic Exercise, UE/LE Strength taining/ROM, UE/LE Coordination activities, Visual/perceptual remediation/compensation  OT Interventions Balance/vestibular training, Discharge planning, DME/adaptive equipment instruction, Functional mobility training, Neuromuscular re-education, Pain management, Patient/family education, Psychosocial support, Self Care/advanced ADL retraining, Therapeutic Activities, Therapeutic Exercise, UE/LE Strength taining/ROM, UE/LE Coordination activities, Visual/perceptual remediation/compensation  SLP Interventions    TR Interventions    SW/CM Interventions Discharge Planning, Psychosocial Support, Patient/Family Education   Barriers to Discharge MD  Medical stability and spasticity  Nursing Decreased caregiver support, Medication compliance    PT Decreased caregiver support, Home environment access/layout, Lack of/limited family support, Medical stability    OT Decreased caregiver support, Lack of/limited family support    SLP      SW       Team Discharge Planning: Destination: PT-Home ,OT- Home , SLP-  Projected Follow-up: PT-Home health PT, OT-  24 hour supervision/assistance, Home health OT, SLP-  Projected Equipment Needs: PT-Rolling walker with 5" wheels, OT- None recommended by OT(pt has all equipment), SLP-  Equipment Details: PT-pt already owns several RW and powered scooters, OT-  Patient/family involved in discharge planning: PT- Patient,  OT-Patient, SLP-   MD ELOS: 7-10d Medical Rehab Prognosis:  Good Assessment:  84 year old female with history of hereditary spastic paraparesis, HTN, CVA with left sided weakness, endometrial cancer with recent addition of megace; who was admitted on 01/25/2018 with chest pain, blurred vision, dizziness as well as numbness and weakness of bilateral upper extremity.  History taken from chart review, neighbor, and patient. MRI of brain reviewed, showing b/l CVA.  Per report, multiple small acute to  early subacute infarct cerebral and cerebellar hemispheres  question watershed ischemia due to hypoperfusion.  Chest CT showed small right lower lobe PE and she was started on Eliquis for treatment.  2D echo showed EF of 55 to 60% with no wall abnormality, mildly calcified mitral valve and mildly increased pulmonary artery pressures.  Dr. Leonie Man felt that stroke likely embolic cardioembolic versus due to hypercoagulable state from cancer versus marantic endocarditis.  Patient currently with bilateral upper extremity weakness with poor safety awareness, instability and impulsivity with high risk for falls   Now requiring 24/7 Rehab RN,MD, as well as CIR level PT, OT and SLP.  Treatment team will focus on ADLs and mobility with goals set at Sup   See Team Conference Notes for weekly updates to the plan of care

## 2018-01-30 NOTE — Progress Notes (Addendum)
Subjective/Complaints:  Patient denies any new complaints today.  Slept okay last night.  Tolerated therapy well  ROS- denies CP, SOB, N/V/D  Objective: Vital Signs: Blood pressure (!) 131/50, pulse 67, temperature 97.6 F (36.4 C), temperature source Oral, resp. rate 17, height _0  (1.575 m), weight 62.2 kg (137 lb 2 oz), SpO2 100 %. No results found. Results for orders placed or performed during the hospital encounter of 01/27/18 (from the past 72 hour(s))  CBC WITH DIFFERENTIAL     Status: Abnormal   Collection Time: 01/28/18  6:41 AM  Result Value Ref Range   WBC 8.4 4.0 - 10.5 K/uL   RBC 3.77 (L) 3.87 - 5.11 MIL/uL   Hemoglobin 11.3 (L) 12.0 - 15.0 g/dL   HCT 36.2 36.0 - 46.0 %   MCV 96.0 78.0 - 100.0 fL   MCH 30.0 26.0 - 34.0 pg   MCHC 31.2 30.0 - 36.0 g/dL   RDW 13.7 11.5 - 15.5 %   Platelets 207 150 - 400 K/uL   Neutrophils Relative % 65 %   Neutro Abs 5.5 1.7 - 7.7 K/uL   Lymphocytes Relative 20 %   Lymphs Abs 1.7 0.7 - 4.0 K/uL   Monocytes Relative 11 %   Monocytes Absolute 0.9 0.1 - 1.0 K/uL   Eosinophils Relative 3 %   Eosinophils Absolute 0.3 0.0 - 0.7 K/uL   Basophils Relative 1 %   Basophils Absolute 0.0 0.0 - 0.1 K/uL   Immature Granulocytes 0 %   Abs Immature Granulocytes 0.0 0.0 - 0.1 K/uL    Comment: Performed at [facility], [address], [place], [place] [address]  Comprehensive metabolic panel     Status: Abnormal   Collection Time: 01/28/18  6:41 AM  Result Value Ref Range   Sodium 139 135 - 145 mmol/L   Potassium 4.4 3.5 - 5.1 mmol/L   Chloride 108 98 - 111 mmol/L   CO2 23 22 - 32 mmol/L   Glucose, Bld 108 (H) 70 - 99 mg/dL   BUN 24 (H) 8 - 23 mg/dL   Creatinine, Ser 0.94 0.44 - 1.00 mg/dL   Calcium 9.1 8.9 - 10.3 mg/dL   Total Protein 6.0 (L) 6.5 - 8.1 g/dL   Albumin 3.2 (L) 3.5 - 5.0 g/dL   AST 19 15 - 41 U/L   ALT 16 0 - 44 U/L   Alkaline Phosphatase 115 38 - 126 U/L   Total Bilirubin 0.6 0.3 - 1.2 mg/dL   GFR calc non Af  Amer 53 (L) >60 mL/min   GFR calc Af Amer >60 >60 mL/min    Comment: (NOTE) The eGFR has been calculated using the CKD EPI equation. This calculation has not been validated in all clinical situations. eGFR's persistently <60 mL/min signify possible Chronic Kidney Disease.    Anion gap 8 5 - 15    Comment: Performed at [facility] [address], [place], [place] [address]     HEENT: normal Cardio: RRR and no murmur Resp: CTA B/L and Unlabored GI: BS positive and NT, ND Extremity:  No Edema Skin:   Intact Neuro: Alert/Oriented, Anxious, Normal Sensory, Abnormal Motor 4/5 RUE, 5/5 LUE, 3- Bilateral KE ADF, increased tone in Bilateral quads and hamstrings with co contraction and Abnormal FMC Ataxic/ dec FMC Musc/Skel:  Other no pain with UE or LE movement Gen NAD   Assessment/Plan: 1. Functional deficits secondary to RUE weakness and B LE spastic weakness which require  3+ hours per day of interdisciplinary therapy in a comprehensive inpatient rehab setting. Physiatrist is providing close team supervision and 24 hour management of active medical problems listed below. Physiatrist and rehab team continue to assess barriers to discharge/monitor patient progress toward functional and medical goals. FIM: Function - Bathing Position: Shower Body parts bathed by patient: Right arm, Left arm, Chest, Abdomen, Front perineal area, Buttocks, Right upper leg, Left upper leg Bathing not applicable: Right lower leg, Left lower leg, Back Assist Level: Touching or steadying assistance(Pt > 75%)  Function- Upper Body Dressing/Undressing What is the patient wearing?: Bra, Pull over shirt/dress Bra - Perfomed by patient: Thread/unthread right bra strap, Thread/unthread left bra strap, Hook/unhook bra (pull down sports bra) Pull over shirt/dress - Perfomed by patient: Thread/unthread right sleeve, Thread/unthread left sleeve, Put head through opening, Pull shirt over trunk Assist Level: Set  up Set up : To obtain clothing/put away Function - Lower Body Dressing/Undressing What is the patient wearing?: Underwear, Pants, Socks, Shoes Position: Wheelchair/chair at sink Underwear - Performed by patient: Pull underwear up/down Underwear - Performed by helper: Thread/unthread right underwear leg, Thread/unthread left underwear leg Pants- Performed by patient: Pull pants up/down Pants- Performed by helper: Thread/unthread left pants leg, Thread/unthread right pants leg Socks - Performed by patient: Don/doff right sock, Don/doff left sock Socks - Performed by helper: Don/doff left sock Shoes - Performed by patient: Don/doff right shoe, Don/doff left shoe, Fasten right, Fasten left Shoes - Performed by helper: Don/doff left shoe, Fasten right, Fasten left Assist for footwear: Partial/moderate assist Assist for lower body dressing: (Mod assist)  Function - Toileting Toileting steps completed by patient: Adjust clothing prior to toileting, Performs perineal hygiene, Adjust clothing after toileting Toileting Assistive Devices: Grab bar or rail Assist level: Touching or steadying assistance (Pt.75%)  Function - Air cabin crew transfer assistive device: Grab bar, Walker Assist level to toilet: Touching or steadying assistance (Pt > 75%) Assist level from toilet: Touching or steadying assistance (Pt > 75%)  Function - Chair/bed transfer Chair/bed transfer method: Ambulatory Chair/bed transfer assist level: Touching or steadying assistance (Pt > 75%) Chair/bed transfer assistive device: Walker Chair/bed transfer details: Verbal cues for sequencing, Verbal cues for technique, Verbal cues for precautions/safety  Function - Locomotion: Wheelchair Will patient use wheelchair at discharge?: No Max wheelchair distance: 125 ft Assist Level: Touching or steadying assistance (Pt > 75%) Assist Level: Touching or steadying assistance (Pt > 75%) Function - Locomotion:  Ambulation Assistive device: Walker-rolling Max distance: 31' Assist level: Touching or steadying assistance (Pt > 75%) Assist level: Touching or steadying assistance (Pt > 75%) Assist level: Touching or steadying assistance (Pt > 75%) Walk 150 feet activity did not occur: Safety/medical concerns Walk 10 feet on uneven surfaces activity did not occur: Safety/medical concerns  Function - Comprehension Comprehension: Auditory Comprehension assist level: Follows basic conversation/direction with extra time/assistive device  Function - Expression Expression: Verbal Expression assist level: Expresses basic needs/ideas: With extra time/assistive device  Function - Social Interaction Social Interaction assist level: Interacts appropriately 90% of the time - Needs monitoring or encouragement for participation or interaction.  Function - Problem Solving Problem solving assist level: Solves basic 90% of the time/requires cueing < 10% of the time  Function - Memory Memory assist level: Recognizes or recalls 90% of the time/requires cueing < 10% of the time Patient normally able to recall (first 3 days only): Current season, That he or she is in a hospital  Medical Problem List and Plan: 1.  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  CIR PT, OT 2.  DVT Prophylaxis/Anticoagulation: Pharmaceutical: Other (comment)--Eliquis 3. Pain Management: N/A 4. Mood: LCSW to follow for evaluation and support.  5. Neuropsych: This patient is capable of making decisions on her own behalf. 6. Skin/Wound Care: routine pressure relief measures.  7. Fluids/Electrolytes/Nutrition: Monitor I/O.   Normal CMET except low alb , add supplement 8. HTN: Monitor BP bid. Continue Lisinopril daily-controlled 01/30/2018 Vitals:   01/29/18 2205 01/30/18 0430  BP: (!) 132/46 (!) 131/50   Pulse: 66 67  Resp: 17 17  Temp: (!) 97.4 F (36.3 C) 97.6 F (36.4 C)  SpO2: 99% 100%  9. Endometrial cancer: Treated non surgically--on megace tid.  Per pt her Gyn would like pt to have hysterectomy will discuss ~62mopost CVA 10. Anemia: Continue to monitor for stability.  11.  Spastic paraparesis will trial baclofen low dose, may need to titrate upward.  Monitor mental status.   LOS (Days) 3 A FACE TO FACE EVALUATION WAS PERFORMED  ACharlett Blake7/26/2019, 8:27 AM

## 2018-01-30 NOTE — Plan of Care (Signed)
  Problem: RH SAFETY Goal: RH STG ADHERE TO SAFETY PRECAUTIONS W/ASSISTANCE/DEVICE Description STG Adhere to Safety Precautions With min Assistance and appropriate assistive Device.  Outcome: Progressing  Call at hand, bed alarm, proper footwear

## 2018-01-30 NOTE — Plan of Care (Signed)
  Problem: Consults Goal: RH STROKE PATIENT EDUCATION Description See Patient Education module for education specifics  Outcome: Progressing   Problem: RH SAFETY Goal: RH STG ADHERE TO SAFETY PRECAUTIONS W/ASSISTANCE/DEVICE Description STG Adhere to Safety Precautions With min Assistance and appropriate assistive Device.  Outcome: Progressing Goal: RH STG DECREASED RISK OF FALL WITH ASSISTANCE Description STG Decreased Risk of Fall With min Assistance.  Outcome: Progressing   Problem: RH PAIN MANAGEMENT Goal: RH STG PAIN MANAGED AT OR BELOW PT'S PAIN GOAL Description <3 on a 0-10 pain scale.  Outcome: Progressing

## 2018-01-30 NOTE — Progress Notes (Signed)
Occupational Therapy Session Note  Patient Details  Name: Vicki Stein MRN: [id number] Date of Birth: 26-Feb-1928  Today's Date: 01/30/2018 OT Individual Time: 5638-7564 and 1515-1600 OT Individual Time Calculation (min): 60 min and 45 min   Short Term Goals: Week 1:  OT Short Term Goal 1 (Week 1): STG = LTGs due to ELOS  Skilled Therapeutic Interventions/Progress Updates:    1) Treatment session with focus on functional mobility and self-care retraining.  Pt received seated upright in bed finishing breakfast.  Pt notified therapist that her Rt hand was more numb today and that it seems to come and go.  Pt reports she notified MD of her numbness.  Increased time for pt to initiate getting OOB as she was much more tangential in her speaking and required max cues for redirection to participate in treatment session.  Pt ambulated to bathroom with RW with contact guard and cues to pick up BLE when ambulating.  Pt voided on toilet and then engaged in LB dressing from toilet as she reports she would typically dress from toilet seat at home.  Pt required increased time and cues for problem solving with use of reacher (pt reports utilizing reacher PTA).  Pt stating "it didn't have any problems with this before", therapist educating on impact of stroke.  Pt incontinent of BM while dressing, requiring cues for problem solving to undress to clean as pt initially continuing to dress despite conveying that she had had an incontinent episode.  Once finished on toilet pt able to don pants with setup assist and cues for problem solving.  Pt ambulated to sink and completed grooming tasks in standing, cues for RW placement as pt pushing RW out to side.  Left upright in w/c with seat belt alarm and all needs in reach.  2) Treatment session with focus on LB dressing and functional transfers.  Pt received with nurse tech ambulating to bathroom.  Pt completed transfer with contact guard and completed toileting tasks with  close supervision.  Pt attempting to apply incontinence pad underneath underwear and then sticky side up, requiring cues for orientation of pad.  Ambulated to sink to wash hands, cues for RW placement for increased safety with mobility.  Transferred to EOB to complete LB dressing simulated tasks to provide a lower height surface to simulate home routine.  Utilized theraband with reacher to practice threading pant legs.  Pt required mod cues for sequencing and problem solving with pt continuing to reference previous routine.  Therapist educating on effects of stroke and impact on prior technique with dressing tasks.  Pt returned to bed at end of session due to fatigue.    Therapy Documentation Precautions:  Precautions Precautions: Fall Restrictions Weight Bearing Restrictions: No Pain: Pain Assessment Pain Score: 0-No pain  See Function Navigator for Current Functional Status.   Therapy/Group: Individual Therapy  Simonne Come 01/30/2018, 10:53 AM

## 2018-01-30 NOTE — Progress Notes (Signed)
Physical Therapy Session Note  Patient Details  Name: Vicki Stein MRN: [id number] Date of Birth: 26-Dec-1927  Today's Date: 01/30/2018 PT Individual Time: 0300-9233 and 0076-2263 PT Individual Time Calculation (min): 56 min and 27 min  Short Term Goals: Week 1:  PT Short Term Goal 1 (Week 1): =LTG due to ELOS  Skilled Therapeutic Interventions/Progress Updates:  Treatment 1: Pt received in w/c & agreeable to tx. No c/o pain reported. Session focused on d/c planning, pt education, and functional mobility. Transported pt throughout unit via w/c total assist for time management. Pt ambulates 40 ft with RW & min assist with slightly improved hip flexion and ability to clear floor with BLE on this date. Discussed home set up and DME with pt reporting she has multiple walkers, rollator, and motorized power w/c she can use in and outside of the home but she chooses not to in order to maintain her mobility. Pt reports she has 4 steps to enter at garage with L rail with therapist educating her on potential need for a ramp installation as she is having difficulty negotiating stairs. Pt reports she has made mobility modifications all of her life and she is capable of negotiating her stairs at home in the manner she has always done. In gym pt negotiates 6 steps (3" as pt reports her stairs at home are shorter than average) with L rail and mod assist with difficulty placing entire foot on step (pt reports this is baseline) and cuing for step-to pattern for increased safety and balance. Pt reports "let me catch my wind" during task 2/2 fatigue. Continued to educate pt on benefits of ramp installation but pt continues to report she is capable of negotiating her stairs at home despite inability to reproduce task in gym. Educated pt on need for family to come in prior to d/c for caregiver education as this therapist is recommending 24 hr assist upon d/c. At end of session pt left sitting in w/c with alarm belt donned &  all needs within reach.   Treatment 2: Pt received in w/c & agreeable to tx, noting 8.5/10 pain in L side & pain meds requested. Transported pt to/from gym via w/c total assist for time management. Gait x 90 ft with RW with CGA assist with slight improvement in foot clearance 2/2 slightly improved hip flexion. Pt stood on compliant surface with min assist with UE support while engaging in functional task of folding washcloths with task focusing on standing balance (pt reports she sits down to complete laundry at home). Pt has great difficulty clearing foot when stepping up/down on compliant foam. At end of session pt left sitting in w/c with alarm belt donned & all needs within reach.  Therapy Documentation Precautions:  Precautions Precautions: Fall Restrictions Weight Bearing Restrictions: No   See Function Navigator for Current Functional Status.   Therapy/Group: Individual Therapy  [name] 01/30/2018, 2:06 PM

## 2018-01-31 ENCOUNTER — Inpatient Hospital Stay (HOSPITAL_COMMUNITY): Payer: Medicare Other | Admitting: Physical Therapy

## 2018-01-31 ENCOUNTER — Inpatient Hospital Stay (HOSPITAL_COMMUNITY): Payer: Medicare Other | Admitting: Occupational Therapy

## 2018-01-31 DIAGNOSIS — D638 Anemia in other chronic diseases classified elsewhere: Secondary | ICD-10-CM

## 2018-01-31 DIAGNOSIS — I10 Essential (primary) hypertension: Secondary | ICD-10-CM

## 2018-01-31 DIAGNOSIS — I952 Hypotension due to drugs: Secondary | ICD-10-CM

## 2018-01-31 MED ORDER — ACETAMINOPHEN-CODEINE #3 300-30 MG PO TABS
1.0000 | ORAL_TABLET | Freq: Three times a day (TID) | ORAL | Status: DC | PRN
  Administered 2018-01-31 – 2018-02-03 (×7): 1 via ORAL
  Filled 2018-01-31 (×9): qty 1

## 2018-01-31 NOTE — Progress Notes (Signed)
Occupational Therapy Session Note  Patient Details  Name: Vicki Stein MRN: [id number] Date of Birth: 07/24/1927  Today's Date: 01/31/2018 OT Individual Time: 0800-0900 and 1430-1530 OT Individual Time Calculation (min): 60 min and 60 min   Short Term Goals: Week 1:  OT Short Term Goal 1 (Week 1): STG = LTGs due to ELOS      Skilled Therapeutic Interventions/Progress Updates:    Visit 1:  abdominal pain - RN aware Pt seen for ADL training of shower, dressing, and toileting with a focus on safe movement patterns.   Much of the session,pt spent a great deal of time talking about how she has always had to do things her own way to modify for her congenital impairments.  Pt repeatedly said she was frustrated that the staff were holding her back and if she would be allowed to use a RW by herself she would be fine and ready to go home.  Overall pt needed touching A with balance as her L leg would drag behind occasionally and set up to min A with LB dressing using a reacher.  Pt very concerned about the therapists changing the ways she does things.  Pt also talked about how some things do seem harder, like decreased sensation in her R hand and that her thinking was not as clear.  Pt resting in w/c with chair alarm on.  Visit 2: abdominal pain - RN aware Pt seen this session for functional mobility with IADL tasks as pt states she is very independent and has never accepted help.  "If I fell at home I would never allow my husband or family to help me get up". It has been challenging for rehab staff to guide and counsel patient for safe strategies and S at home. Pt frequently repeated the same stories to this therapist that she said this am and said "a girl helped me with my shower today".  Pt needed reminding that this therapist was the same one that assisted with the shower. Pt taken to ADL apt to practice ambulating with the RW to retrieve items from the pantry and cabinets with close S, sit to stand  to low couch with close S and making a bed from standing with close S (just fixing top sheet and comforter).  Pt taken back to room and pt ambulated with RW to toilet and toileted with close S.  Pt opted to stay in w/c. Chair alarm set and all needs met.    Therapy Documentation Precautions:  Precautions Precautions: Fall Restrictions Weight Bearing Restrictions: No    Vital Signs: Therapy Vitals Temp: 98.4 F (36.9 C) Temp Source: Oral Pulse Rate: 64 Resp: 18 BP: (!) 117/46(nurse notified) Patient Position (if appropriate): Lying Oxygen Therapy SpO2: 99 % O2 Device: Room Air Pain: Pain Assessment Pain Scale: 0-10 Pain Score: 7  Pain Type: Chronic pain Pain Location: Abdomen Pain Orientation: Left Pain Descriptors / Indicators: Aching Pain Frequency: Intermittent Pain Onset: On-going Pain Intervention(s): Medication (See eMAR) ADL:    See Function Navigator for Current Functional Status.   Therapy/Group: Individual Therapy  [place] 01/31/2018, 8:00 AM

## 2018-01-31 NOTE — Progress Notes (Signed)
Pt c/o 10/10 pain and reports Tylenol doesn't work.  Said that only thing that works is Tylenol 3.  Reviewed hx and was prescribed prior to admin.  Called MD and rec'd new orders.  Pt has tolerated medication and override allergy due to taken and tolerated

## 2018-01-31 NOTE — Progress Notes (Signed)
Subjective/Complaints: Pt seen lying in bed this AM.  She states she slept well overnight, but is cold this morning.  She does not want me to change the temperature, however.   ROS- Denies CP, SOB, N/V/D  Objective: Vital Signs: Blood pressure (!) 117/46, pulse 64, temperature 98.4 F (36.9 C), temperature source Oral, resp. rate 18, height 5\' 2"  (1.575 m), weight 62.2 kg (137 lb 2 oz), SpO2 99 %. No results found. No results found for this or any previous visit (from the past 72 hour(s)).   HENT: Normocephalic, atraumatic Eyes: EOMI. No discharge Cardio: RRR and no JVD Resp: CTA B/l and Unlabored GI: BS positive and ND Skin:   Intact. Warm and dry. Neuro:  HOH Somnolent Motor: 4/5 RUE proximal to distal 5/5 LUE proximal to distal 3-/5 Bilateral KE ADF Increased tone b/l LE Musc/Skel:  No edema or tenderness in extremities Gen NAD. Vital signs reviewed  Assessment/Plan: 1. Functional deficits secondary to RUE weakness and B LE spastic weakness which require 3+ hours per day of interdisciplinary therapy in a comprehensive inpatient rehab setting. Physiatrist is providing close team supervision and 24 hour management of active medical problems listed below. Physiatrist and rehab team continue to assess barriers to discharge/monitor patient progress toward functional and medical goals. FIM: Function - Bathing Position: Shower Body parts bathed by patient: Right arm, Left arm, Chest, Abdomen, Front perineal area, Buttocks, Right upper leg, Left upper leg Bathing not applicable: Right lower leg, Left lower leg, Back Assist Level: Touching or steadying assistance(Pt > 75%)  Function- Upper Body Dressing/Undressing What is the patient wearing?: Bra, Pull over shirt/dress Bra - Perfomed by patient: Thread/unthread right bra strap, Thread/unthread left bra strap, Hook/unhook bra (pull down sports bra) Pull over shirt/dress - Perfomed by patient: Thread/unthread right sleeve,  Thread/unthread left sleeve, Put head through opening, Pull shirt over trunk Assist Level: Set up Set up : To obtain clothing/put away Function - Lower Body Dressing/Undressing What is the patient wearing?: Underwear, Pants, Shoes Position: (seated on toilet) Underwear - Performed by patient: Thread/unthread right underwear leg, Thread/unthread left underwear leg, Pull underwear up/down Underwear - Performed by helper: Thread/unthread right underwear leg, Thread/unthread left underwear leg Pants- Performed by patient: Thread/unthread left pants leg, Pull pants up/down Pants- Performed by helper: Thread/unthread right pants leg Socks - Performed by patient: Don/doff right sock, Don/doff left sock Socks - Performed by helper: Don/doff left sock Shoes - Performed by patient: Fasten right, Fasten left Shoes - Performed by helper: Don/doff right shoe, Don/doff left shoe Assist for footwear: Partial/moderate assist Assist for lower body dressing: (Mod assist)  Function - Toileting Toileting steps completed by patient: Adjust clothing prior to toileting, Performs perineal hygiene, Adjust clothing after toileting Toileting Assistive Devices: Grab bar or rail Assist level: Supervision or verbal cues  Function - Air cabin crew transfer assistive device: Landscape architect, Walker Assist level to toilet: Touching or steadying assistance (Pt > 75%) Assist level from toilet: Touching or steadying assistance (Pt > 75%)  Function - Chair/bed transfer Chair/bed transfer method: Ambulatory Chair/bed transfer assist level: Touching or steadying assistance (Pt > 75%) Chair/bed transfer assistive device: Walker Chair/bed transfer details: Verbal cues for sequencing, Verbal cues for technique, Verbal cues for precautions/safety  Function - Locomotion: Wheelchair Will patient use wheelchair at discharge?: No Max wheelchair distance: 125 ft Assist Level: Touching or steadying assistance (Pt >  75%) Assist Level: Touching or steadying assistance (Pt > 75%) Function - Locomotion: Ambulation Assistive device: Walker-rolling Max distance:  90 ft Assist level: Touching or steadying assistance (Pt > 75%) Assist level: Touching or steadying assistance (Pt > 75%) Assist level: Touching or steadying assistance (Pt > 75%) Walk 150 feet activity did not occur: Safety/medical concerns Walk 10 feet on uneven surfaces activity did not occur: Safety/medical concerns  Function - Comprehension Comprehension: Auditory Comprehension assist level: Understands basic 90% of the time/cues < 10% of the time  Function - Expression Expression: Verbal Expression assist level: Expresses basic needs/ideas: With extra time/assistive device  Function - Social Interaction Social Interaction assist level: Interacts appropriately 90% of the time - Needs monitoring or encouragement for participation or interaction.  Function - Problem Solving Problem solving assist level: Solves basic 75 - 89% of the time/requires cueing 10 - 24% of the time  Function - Memory Memory assist level: Recognizes or recalls 90% of the time/requires cueing < 10% of the time Patient normally able to recall (first 3 days only): Current season, That he or she is in a hospital  Medical Problem List and Plan: 1.  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   Continue CIR  Notes reviewed- CVA with history of spastic paraparesis, images reviewed showing bilateral CVA, labs reviewed 2.  DVT Prophylaxis/Anticoagulation: Pharmaceutical: Other (comment)--Eliquis 3. Pain Management: N/A 4. Mood: LCSW to follow for evaluation and support.  5. Neuropsych: This patient is capable of making decisions on her own behalf. 6. Skin/Wound Care: routine pressure relief measures.  7. Fluids/Electrolytes/Nutrition:  Monitor I/O.    BMP within acceptable range on 7/24 8. HTN: Monitor BP bid. Continue Lisinopril daily Vitals:   01/30/18 2025 01/31/18 0513  BP: (!) 111/44 (!) 117/46  Pulse: 76 64  Resp: 17 18  Temp: 98.7 F (37.1 C) 98.4 F (36.9 C)  SpO2: 100% 99%   Blood pressures? Trending down, we'll consider reduction medication tomorrow if persistently low 9. Endometrial cancer: Treated non surgically--on megace tid.  Per pt her Gyn would like pt to have hysterectomy ~60mo post CVA 10. Anemia: Continue to monitor for stability.   Hemoglobin 11.3 on 10/24  Continue to monitor 11.  Spastic paraparesis  Trial baclofen low dose, may need to titrate upward.    Monitor mental status.   LOS (Days) 4 A FACE TO FACE EVALUATION WAS PERFORMED  Ankit Lorie Phenix 01/31/2018, 12:06 PM

## 2018-01-31 NOTE — Progress Notes (Addendum)
Physical Therapy Session Note  Patient Details  Name: Vicki Stein MRN: [id number] Date of Birth: July 23, 1927  Today's Date: 01/31/2018 PT Individual Time: 0865-7846 PT Individual Time Calculation (min): 72 min   Short Term Goals: Week 1:  PT Short Term Goal 1 (Week 1): =LTG due to ELOS  Skilled Therapeutic Interventions/Progress Updates:  Pt received in w/c & agreeable to tx. Pt reporting 10/10 chronic pain in L abdomen - RN made aware but reports pt cannot receive pain meds yet. Pt able to participate in treatment and was not limited by pain. Transported pt outside [place] tower where pt ambulated 150 ft over uneven surfaces with RW & close supervision, requiring only min assist x 1 2/2 LOB. Pt continues to ambulate with minimal hip and knee flexion, but no longer dragging feet and with minimal BLE foot clearance and significantly decreased gait speed. Continued to educate pt on d/c plans ([place] d/c this coming week) and temporary modifications following stroke as pt continues to report how she performed activities PTA and how she hopes to return to doing them in the same manner (ex: stair negotiation). Pt negotiates 7 steps x 2 trials with L rail to simulate home entry with close supervision for ascending stairs, min assist for descending. Pt now reports her son is debating installing a ramp (no family present for education during this session - have asked LCSW to contact her sons to have them come in during therapy). Pt utilized nu-step on level 3 x 10 minutes with all four extremities with task focusing on stretching and increasing hip/knee flexion ROM and endurance training. At end of session pt left sitting in w/c in room with alarm belt donned & all needs within reach.   During session pt reports she is hardheaded but is trying to be more willing to follow therapist's suggestions - educated pt these are temporary modifications until she is more independent and can return to PLOF.  Therapy  Documentation Precautions:  Precautions Precautions: Fall Restrictions Weight Bearing Restrictions: No  See Function Navigator for Current Functional Status.   Therapy/Group: Individual Therapy  [name] 01/31/2018, 2:46 PM

## 2018-02-01 DIAGNOSIS — G894 Chronic pain syndrome: Secondary | ICD-10-CM

## 2018-02-01 DIAGNOSIS — C541 Malignant neoplasm of endometrium: Secondary | ICD-10-CM | POA: Insufficient documentation

## 2018-02-01 MED ORDER — LISINOPRIL 20 MG PO TABS
20.0000 mg | ORAL_TABLET | Freq: Every morning | ORAL | Status: DC
  Administered 2018-02-02 – 2018-02-04 (×3): 20 mg via ORAL
  Filled 2018-02-01 (×4): qty 1

## 2018-02-01 NOTE — Plan of Care (Signed)
  Problem: Consults Goal: RH STROKE PATIENT EDUCATION Description See Patient Education module for education specifics  Outcome: Progressing   Problem: RH SAFETY Goal: RH STG ADHERE TO SAFETY PRECAUTIONS W/ASSISTANCE/DEVICE Description STG Adhere to Safety Precautions With min Assistance and appropriate assistive Device.  Outcome: Progressing Goal: RH STG DECREASED RISK OF FALL WITH ASSISTANCE Description STG Decreased Risk of Fall With min Assistance.  Outcome: Progressing   Problem: RH PAIN MANAGEMENT Goal: RH STG PAIN MANAGED AT OR BELOW PT'S PAIN GOAL Description <3 on a 0-10 pain scale.  Outcome: Progressing

## 2018-02-01 NOTE — Progress Notes (Signed)
Subjective/Complaints: Patient seen lying in bed this morning. She states she slept well overnight. Yesterday patient complained of pain stating that only Tylenol No. 3 worked for her.  ROS- denies CP, SOB, N/V/D  Objective: Vital Signs: Blood pressure (!) 111/36, pulse 77, temperature 98 F (36.7 C), temperature source Oral, resp. rate 18, height 5\' 2"  (1.575 m), weight 62.2 kg (137 lb 2 oz), SpO2 100 %. No results found. No results found for this or any previous visit (from the past 72 hour(s)).   HENT: Normocephalic, atraumatic Eyes: EOMI. No discharge Cardio: RRR. No JVD Resp: CTA bilaterally. Unlabored GI: BS positive and ND Skin:   Intact. Warm and dry. Neuro:  HOH Somnolent Motor: 4/5 RUE proximal to distal 5/5 LUE proximal to distal 3-/5 Bilateral HF, KE 2/5 ADF Increased tone b/l LE Musc/Skel:  No edema or tenderness in extremities Gen NAD. Vital signs reviewed  Assessment/Plan: 1. Functional deficits secondary to RUE weakness and B LE spastic weakness which require 3+ hours per day of interdisciplinary therapy in a comprehensive inpatient rehab setting. Physiatrist is providing close team supervision and 24 hour management of active medical problems listed below. Physiatrist and rehab team continue to assess barriers to discharge/monitor patient progress toward functional and medical goals. FIM: Function - Bathing Position: Shower Body parts bathed by patient: Right arm, Left arm, Chest, Abdomen, Front perineal area, Buttocks, Right upper leg, Left upper leg, Right lower leg, Left lower leg Body parts bathed by helper: Back Bathing not applicable: Right lower leg, Left lower leg, Back Assist Level: Supervision or verbal cues  Function- Upper Body Dressing/Undressing What is the patient wearing?: Bra, Pull over shirt/dress Bra - Perfomed by patient: Thread/unthread right bra strap, Thread/unthread left bra strap, Hook/unhook bra (pull down sports bra) Pull over  shirt/dress - Perfomed by patient: Thread/unthread right sleeve, Thread/unthread left sleeve, Put head through opening, Pull shirt over trunk Assist Level: Set up Set up : To obtain clothing/put away Function - Lower Body Dressing/Undressing What is the patient wearing?: Underwear, Pants, Shoes, Socks Position: (seated on toilet) Underwear - Performed by patient: Thread/unthread right underwear leg, Thread/unthread left underwear leg, Pull underwear up/down Underwear - Performed by helper: Thread/unthread right underwear leg, Thread/unthread left underwear leg Pants- Performed by patient: Thread/unthread left pants leg, Pull pants up/down, Thread/unthread right pants leg Pants- Performed by helper: Thread/unthread right pants leg Socks - Performed by patient: Don/doff right sock, Don/doff left sock Socks - Performed by helper: Don/doff left sock Shoes - Performed by patient: Fasten right, Fasten left Shoes - Performed by helper: Don/doff right shoe, Don/doff left shoe Assist for footwear: Partial/moderate assist Assist for lower body dressing: (Mod assist)  Function - Toileting Toileting steps completed by patient: Adjust clothing prior to toileting, Performs perineal hygiene, Adjust clothing after toileting Toileting Assistive Devices: Grab bar or rail Assist level: Supervision or verbal cues  Function - Air cabin crew transfer assistive device: Landscape architect, Walker Assist level to toilet: Touching or steadying assistance (Pt > 75%) Assist level from toilet: Touching or steadying assistance (Pt > 75%)  Function - Chair/bed transfer Chair/bed transfer method: Ambulatory Chair/bed transfer assist level: Touching or steadying assistance (Pt > 75%) Chair/bed transfer assistive device: Walker Chair/bed transfer details: Verbal cues for sequencing, Verbal cues for technique, Verbal cues for precautions/safety  Function - Locomotion: Wheelchair Will patient use wheelchair at  discharge?: No Max wheelchair distance: 125 ft Assist Level: Touching or steadying assistance (Pt > 75%) Assist Level: Touching or steadying assistance (Pt >  75%) Function - Locomotion: Ambulation Assistive device: Walker-rolling Max distance: 150 ft Assist level: Supervision or verbal cues Assist level: Supervision or verbal cues Assist level: Supervision or verbal cues Walk 150 feet activity did not occur: Safety/medical concerns Assist level: Supervision or verbal cues Walk 10 feet on uneven surfaces activity did not occur: Safety/medical concerns Assist level: Supervision or verbal cues  Function - Comprehension Comprehension: Auditory Comprehension assist level: Understands basic 90% of the time/cues < 10% of the time  Function - Expression Expression: Verbal Expression assist level: Expresses basic needs/ideas: With extra time/assistive device  Function - Social Interaction Social Interaction assist level: Interacts appropriately 90% of the time - Needs monitoring or encouragement for participation or interaction.  Function - Problem Solving Problem solving assist level: Solves basic 75 - 89% of the time/requires cueing 10 - 24% of the time  Function - Memory Memory assist level: Recognizes or recalls 90% of the time/requires cueing < 10% of the time Patient normally able to recall (first 3 days only): Current season, That he or she is in a hospital  Medical Problem List and Plan: 1.  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   Continue CIR 2.  DVT Prophylaxis/Anticoagulation: Pharmaceutical: Other (comment)--Eliquis 3. Pain Management: Tylenol No. 3 added on 7/27 per patient 4. Mood: LCSW to follow for evaluation and support.  5. Neuropsych: This patient is capable of making decisions on her own behalf. 6. Skin/Wound Care: routine  pressure relief measures.  7. Fluids/Electrolytes/Nutrition: Monitor I/O.    BMP within acceptable range on 7/24 8. HTN: Monitor BP bid. Vitals:   01/31/18 2112 02/01/18 0600  BP: (!) 109/42 (!) 111/36  Pulse: 64 77  Resp: 18 18  Temp: 97.7 F (36.5 C) 98 F (36.7 C)  SpO2: 98% 100%   Relatively low on 7/28, lisinopril decreased to 20 mg 9. Endometrial cancer: Treated non surgically--on megace tid.  Per pt her Gyn would like pt to have hysterectomy ~50mo post CVA 10. Anemia: Continue to monitor for stability.   Hemoglobin 11.3 on 7/24  Continue to monitor 11.  Spastic paraparesis  Trial baclofen low dose, may need to titrate upward.    Monitor mental status.   LOS (Days) 5 A FACE TO FACE EVALUATION WAS PERFORMED  Sherree Shankman Lorie Phenix 02/01/2018, 8:00 AM

## 2018-02-02 ENCOUNTER — Inpatient Hospital Stay (HOSPITAL_COMMUNITY): Payer: Medicare Other | Admitting: Physical Therapy

## 2018-02-02 ENCOUNTER — Inpatient Hospital Stay (HOSPITAL_COMMUNITY): Payer: Medicare Other | Admitting: Occupational Therapy

## 2018-02-02 DIAGNOSIS — R252 Cramp and spasm: Secondary | ICD-10-CM

## 2018-02-02 DIAGNOSIS — I6349 Cerebral infarction due to embolism of other cerebral artery: Secondary | ICD-10-CM

## 2018-02-02 LAB — CBC
HCT: 33 % — ABNORMAL LOW (ref 36.0–46.0)
Hemoglobin: 10.5 g/dL — ABNORMAL LOW (ref 12.0–15.0)
MCH: 30 pg (ref 26.0–34.0)
MCHC: 31.8 g/dL (ref 30.0–36.0)
MCV: 94.3 fL (ref 78.0–100.0)
PLATELETS: 197 10*3/uL (ref 150–400)
RBC: 3.5 MIL/uL — ABNORMAL LOW (ref 3.87–5.11)
RDW: 13.4 % (ref 11.5–15.5)
WBC: 7.8 10*3/uL (ref 4.0–10.5)

## 2018-02-02 LAB — BASIC METABOLIC PANEL
ANION GAP: 9 (ref 5–15)
BUN: 40 mg/dL — AB (ref 8–23)
CO2: 18 mmol/L — ABNORMAL LOW (ref 22–32)
Calcium: 8.9 mg/dL (ref 8.9–10.3)
Chloride: 114 mmol/L — ABNORMAL HIGH (ref 98–111)
Creatinine, Ser: 1.16 mg/dL — ABNORMAL HIGH (ref 0.44–1.00)
GFR calc Af Amer: 47 mL/min — ABNORMAL LOW (ref >60)
GFR calc non Af Amer: 41 mL/min — ABNORMAL LOW (ref >60)
Glucose, Bld: 107 mg/dL — ABNORMAL HIGH (ref 70–99)
POTASSIUM: 4.6 mmol/L (ref 3.5–5.1)
Sodium: 141 mmol/L (ref 135–145)

## 2018-02-02 NOTE — Progress Notes (Signed)
Occupational Therapy Session Note  Patient Details  Name: Vicki Stein MRN: [id number] Date of Birth: [birth year]  Today's Date: 02/02/2018 OT Individual Time: 0930-1030 and 1300-1345 OT Individual Time Calculation (min): 60 min and 45 min   Short Term Goals: Week 1:  OT Short Term Goal 1 (Week 1): STG = LTGs due to ELOS  Skilled Therapeutic Interventions/Progress Updates:    1) Treatment session with focus on ADL retraining with use of AE to decrease burden of care with LB dressing.  Pt reports typically dressing from toilet at home, therefore ambulated to bathroom with RW and close supervision.  Pt completed toileting and donned incontinence pullup and pants with increased time and use of reacher, min cues for problem solving.  Pt unable to don shoes this session due to tightness in BLE.  Pt reports at home if she had difficulty donning shoes, she would just return to her room and make the bed and complete other straightening up tasks and then return to the toilet and she would be able to cross her legs and don shoes.  Therapist assisted with donning shoes this session due to time constraints and pt unwilling to attempt alternative AE to increase success with donning shoes with pt reluctant to use any additional assistive devices or complete tasks differently than before.  Engaged in [place] on kinetron 2 mins x2 in standing.  Returned to room and left upright in w/c with seat belt alarm fastened and all needs in reach.  2) Treatment session with focus on d/c planning and therapeutic activity.  Pt received upright in w/c expressing desire to go home.  Discussed pt's goals and current status as well as focus on adaptive techniques to increase pt success.  Pt reports having always done for herself and wanting to return home to continue to do for herself as long as she can. Pt states that other than tightness, which she blames on not being about to do for herself while in rehab, she is at  baseline.  Pt expressing desire to engage in [place] again as she enjoyed it and felt it was good for her legs.  Pt completed stand pivot transfer supervision and completed 4 mins on resistance level 3 - pt reports pain in Lt knee, expressing desire to terminate task.  Upon return to room, pt's son Vicki Stein had arrived.  Engaged in discussion regarding pt PLOF and pt current status with son reporting that pt is at baseline and that some days are more challenging than others and she would typically modify her routine.  Pt's son reports pt having plethora of equipment and that he and his brother would provide supervision initially.  Pt again expressing desire to return home to do for herself.    Therapy Documentation Precautions:  Precautions Precautions: Fall Restrictions Weight Bearing Restrictions: No Pain: Pain Assessment Pain Scale: Faces Pain Score: 0-No pain   See Function Navigator for Current Functional Status.   Therapy/Group: Individual Therapy  Simonne Come 02/02/2018, 12:13 PM

## 2018-02-02 NOTE — Progress Notes (Addendum)
Physical Therapy Session Note  Patient Details  Name: Vicki Stein MRN: [id number] Date of Birth: [birth year]  Today's Date: 02/02/2018 PT Individual Time: 2951-8841 and 1349-1435 PT Individual Time Calculation (min): 59 min and 46 min  Short Term Goals: Week 1:  PT Short Term Goal 1 (Week 1): =LTG due to ELOS  Skilled Therapeutic Interventions/Progress Updates:  Treatment 1: Pt received in recliner & agreeable to tx. No c/o pain reported. Provided pt with pockettalker to ensure pt was able to hear therapist & other staff members. Pt appreciative of device & reports it helps but pt remains confused as during session pt states "how am I going to walk with my feet?!". Transported pt to gym via w/c total assist for time management. Pt completed car transfer at sedan simulated height with RW & min assist to transfer LLE out of car as pt reports she is very stiff today but can complete the car transfer in her car. Therapist continued to educate pt on new difficulties with functional mobility 2/2 stroke but pt continues to demonstrate absent anticipatory awareness & also reports difficulty accepting she has to make changes. In dayroom pt engaged in standing balance task of table hockey with LUE support and steady assist for balance. Encouraged & educated pt on need to attempt task without UE support to challenge balance but pt repeatedly states "I can't do that" and uses LUE for support. Pt ambulates 52 ft with RW & supervision with greater difficulty clearing LE from floor on this date. Returned to room & briefly educated pt on trialing AFO's during PM session but pt reports she does not wish to do so, as that "feels like giving up". Therapist plans to discuss AFO's with pt in PM session. Pt left in w/c with alarm belt donned, needs in reach, & set up with lunch tray.  Treatment 2: Pt received in w/c with son [name]) present for session; pt agreeable to tx with no c/o pain. Pt & son both report pt is almost  at baseline level of function. [name] reports pt is unable to clear feet off floor as well as she did PTA but is more or less the same. [name] reports pt has all the DME she needs and he has been trying to get her to look into ALF's and transition to using a power w/c. They report pt can hold to L rail and storm door with RUE to enter home or he has a portable ramp he can install if necessary. Pt negotiates 4 steps with L rail in gym with min assist due to difficulty advancing LE onto next step but [name] reports pt is almost at baseline. Pt completes car transfer at [place] simulated height with supervision and significantly extra time to place LLE in/out of car although [name] reports pt will intermittently have "stiff days" and will have to use her UE to assist LE in/out of car. Pt ambulates 30 ft with RW & supervision with minimal foot clearance. Therapist educated pt & [name] on recommendation of 24 hr supervision following d/c from hospital. Gillermina Phy on pt's confusion with [name] reporting this began prior to stroke & hospitalization. Pt is very eager to d/c home and son reports she is more or less at baseline level of function and are comfortable with pt discharging home as soon as possible. At end of session pt left sitting in w/c in room with alarm belt donned, needs in reach, and son present in room.  Pt/son report she  has a lift chair at home & a safety necklace & a neighbor who checks in on her frequently.  Therapy Documentation Precautions:  Precautions Precautions: Fall Restrictions Weight Bearing Restrictions: No   See Function Navigator for Current Functional Status.   Therapy/Group: Individual Therapy  [name] 02/02/2018, 4:23 PM

## 2018-02-02 NOTE — Progress Notes (Signed)
Subjective/Complaints: Pt without complaints. Anxious to get home. Denies pain. Slept well last night  ROS: Patient denies fever, rash, sore throat, blurred vision, nausea, vomiting, diarrhea, cough, shortness of breath or chest pain, joint or back pain, headache, or mood change.   Objective: Vital Signs: Blood pressure (!) 114/47, pulse 72, temperature 98.6 F (37 C), temperature source Oral, resp. rate 18, height 5' 2" (1.575 m), weight 62.2 kg (137 lb 2 oz), SpO2 99 %. No results found. Results for orders placed or performed during the hospital encounter of 01/27/18 (from the past 72 hour(s))  Basic metabolic panel     Status: Abnormal   Collection Time: 02/02/18  5:05 AM  Result Value Ref Range   Sodium 141 135 - 145 mmol/L   Potassium 4.6 3.5 - 5.1 mmol/L   Chloride 114 (H) 98 - 111 mmol/L   CO2 18 (L) 22 - 32 mmol/L   Glucose, Bld 107 (H) 70 - 99 mg/dL   BUN 40 (H) 8 - 23 mg/dL   Creatinine, Ser 1.16 (H) 0.44 - 1.00 mg/dL   Calcium 8.9 8.9 - 10.3 mg/dL   GFR calc non Af Amer 41 (L) >60 mL/min   GFR calc Af Amer 47 (L) >60 mL/min    Comment: (NOTE) The eGFR has been calculated using the CKD EPI equation. This calculation has not been validated in all clinical situations. eGFR's persistently <60 mL/min signify possible Chronic Kidney Disease.    Anion gap 9 5 - 15    Comment: Performed at [facility], [address] , [place] [address]  CBC     Status: Abnormal   Collection Time: 02/02/18  5:05 AM  Result Value Ref Range   WBC 7.8 4.0 - 10.5 K/uL   RBC 3.50 (L) 3.87 - 5.11 MIL/uL   Hemoglobin 10.5 (L) 12.0 - 15.0 g/dL   HCT 33.0 (L) 36.0 - 46.0 %   MCV 94.3 78.0 - 100.0 fL   MCH 30.0 26.0 - 34.0 pg   MCHC 31.8 30.0 - 36.0 g/dL   RDW 13.4 11.5 - 15.5 %   Platelets 197 150 - 400 K/uL    Comment: Performed at [facility], [address] , [place] [address]     Constitutional: No distress . Vital signs reviewed. HEENT: EOMI, oral membranes  moist Neck: supple Cardiovascular: RRR without murmur. No JVD    Respiratory: CTA Bilaterally without wheezes or rales. Normal effort    GI: BS +, non-tender, non-distended  Neuro:  HOH Alert, functional language Motor: 4/5 RUE proximal to distal 5/5 LUE proximal to distal 3-/5 Bilateral HF, KE 2/5 ADF--stable Increased tone b/l LE tr to 1/4 Musc/Skel:  No edema or tenderness in extremities Psych: pleasant and appropriate  Assessment/Plan: 1. Functional deficits secondary to RUE weakness and B LE spastic weakness which require 3+ hours per day of interdisciplinary therapy in a comprehensive inpatient rehab setting. Physiatrist is providing close team supervision and 24 hour management of active medical problems listed below. Physiatrist and rehab team continue to assess barriers to discharge/monitor patient progress toward functional and medical goals. FIM: Function - Bathing Position: Shower Body parts bathed by patient: Right arm, Left arm, Chest, Abdomen, Front perineal area, Buttocks, Right upper leg, Left upper leg, Right lower leg, Left lower leg Body parts bathed by helper: Back Bathing not applicable: Right lower leg, Left lower leg, Back Assist Level: Supervision or verbal cues  Function- Upper Body Dressing/Undressing What is the   patient wearing?: Bra, Pull over shirt/dress Bra - Perfomed by patient: Thread/unthread right bra strap, Thread/unthread left bra strap, Hook/unhook bra (pull down sports bra) Pull over shirt/dress - Perfomed by patient: Thread/unthread right sleeve, Thread/unthread left sleeve, Put head through opening, Pull shirt over trunk Assist Level: Set up Set up : To obtain clothing/put away Function - Lower Body Dressing/Undressing What is the patient wearing?: Underwear, Pants, Shoes Position: (seated on toilet) Underwear - Performed by patient: Thread/unthread right underwear leg, Thread/unthread left underwear leg, Pull underwear up/down Underwear -  Performed by helper: Thread/unthread right underwear leg, Thread/unthread left underwear leg Pants- Performed by patient: Thread/unthread left pants leg, Pull pants up/down, Thread/unthread right pants leg Pants- Performed by helper: Thread/unthread right pants leg Socks - Performed by patient: Don/doff right sock, Don/doff left sock Socks - Performed by helper: Don/doff left sock Shoes - Performed by patient: Fasten right, Fasten left Shoes - Performed by helper: Don/doff right shoe, Don/doff left shoe, Fasten right, Fasten left Assist for footwear: Maximal assist Assist for lower body dressing: (Mod assist)  Function - Toileting Toileting steps completed by patient: Adjust clothing prior to toileting, Performs perineal hygiene, Adjust clothing after toileting Toileting Assistive Devices: Grab bar or rail Assist level: Supervision or verbal cues  Function - Air cabin crew transfer assistive device: Environmental consultant, Grab bar Assist level to toilet: Supervision or verbal cues Assist level from toilet: Supervision or verbal cues  Function - Chair/bed transfer Chair/bed transfer method: Stand pivot Chair/bed transfer assist level: Touching or steadying assistance (Pt > 75%) Chair/bed transfer assistive device: Walker Chair/bed transfer details: Verbal cues for sequencing, Verbal cues for technique, Verbal cues for precautions/safety  Function - Locomotion: Wheelchair Will patient use wheelchair at discharge?: No Max wheelchair distance: 125 ft Assist Level: Touching or steadying assistance (Pt > 75%) Assist Level: Touching or steadying assistance (Pt > 75%) Function - Locomotion: Ambulation Assistive device: Walker-rolling Max distance: 30 ft Assist level: Supervision or verbal cues Assist level: Supervision or verbal cues Assist level: Supervision or verbal cues Walk 150 feet activity did not occur: Safety/medical concerns Assist level: Supervision or verbal cues Walk 10 feet on  uneven surfaces activity did not occur: Safety/medical concerns Assist level: Supervision or verbal cues  Function - Comprehension Comprehension: Auditory Comprehension assist level: Understands basic 90% of the time/cues < 10% of the time  Function - Expression Expression: Verbal Expression assist level: Expresses basic needs/ideas: With extra time/assistive device  Function - Social Interaction Social Interaction assist level: Interacts appropriately 90% of the time - Needs monitoring or encouragement for participation or interaction.  Function - Problem Solving Problem solving assist level: Solves basic 75 - 89% of the time/requires cueing 10 - 24% of the time  Function - Memory Memory assist level: Recognizes or recalls 90% of the time/requires cueing < 10% of the time Patient normally able to recall (first 3 days only): That he or she is in a hospital  Medical Problem List and Plan: 1.  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   Continue CIR 2.  DVT Prophylaxis/Anticoagulation: Pharmaceutical: Other (comment)--Eliquis 3. Pain Management: Tylenol No. 3 added on 7/27 per patient 4. Mood: LCSW to follow for evaluation and support.  5. Neuropsych: This patient is capable of making decisions on her own behalf. 6. Skin/Wound Care: routine pressure relief measures.  7. Fluids/Electrolytes/Nutrition: Monitor I/O.    -BUN trending up----push  po  -recheck bmet in AM 8. HTN: Monitor BP bid. Vitals:   02/02/18 0516 02/02/18 1526  BP: (!) 129/52 (!) 114/47  Pulse: 71 72  Resp: 18 18  Temp: 98.5 F (36.9 C) 98.6 F (37 C)  SpO2: 99% 99%   lisinopril decreased to 20 mg due to low bp's 9. Endometrial cancer: Treated non surgically--on megace tid.  Per pt her Gyn would like pt to have hysterectomy ~99mopost CVA 10. Anemia: Continue to monitor for  stability.   Hemoglobin 10.5 7/29  Continue to monitor 11.  Spastic paraparesis  Continue baclofen low dose, may need to titrate upward.    Monitor mental status.   LOS (Days) 6 A FACE TO FACE EVALUATION WAS PERFORMED  ZMeredith Staggers7/29/2019, 6:24 PM

## 2018-02-02 NOTE — Progress Notes (Addendum)
Patient woke up very confused tonight. She stated that she was on the 4th floor & they just came & left her here, wanted to know why she was here & stated that she had not seen anyone the whole time she was here. Nurse tech was in the room toileting her as she continue to question him about why she was here. Attempted to reorient the patient but she jumps back & forth & is very hard of hearing. She would say that she didn't know why she was here, but when asked who brought her here, she stated that her family did because she had a stroke. She also stated that she was supposed to go to rehab, but quickly goes back to saying that she didn't know why she was here. Nurse tech stated that she did this last night as well. She went on to say that she wasn't crazy. No acute distress noted. She had calmed down enough to go back to bed & was reoriented multiple times of the time, place & situation. She continues to have vaginal bleeding. Will continue to monitor.

## 2018-02-03 ENCOUNTER — Ambulatory Visit: Payer: Medicare Other | Admitting: Gynecology

## 2018-02-03 ENCOUNTER — Inpatient Hospital Stay (HOSPITAL_COMMUNITY): Payer: Medicare Other | Admitting: Occupational Therapy

## 2018-02-03 ENCOUNTER — Other Ambulatory Visit: Payer: Self-pay

## 2018-02-03 ENCOUNTER — Inpatient Hospital Stay (HOSPITAL_COMMUNITY): Payer: Medicare Other | Admitting: Physical Therapy

## 2018-02-03 DIAGNOSIS — N939 Abnormal uterine and vaginal bleeding, unspecified: Secondary | ICD-10-CM

## 2018-02-03 LAB — BASIC METABOLIC PANEL
ANION GAP: 6 (ref 5–15)
BUN: 40 mg/dL — AB (ref 8–23)
CHLORIDE: 113 mmol/L — AB (ref 98–111)
CO2: 18 mmol/L — ABNORMAL LOW (ref 22–32)
Calcium: 8.7 mg/dL — ABNORMAL LOW (ref 8.9–10.3)
Creatinine, Ser: 1.16 mg/dL — ABNORMAL HIGH (ref 0.44–1.00)
GFR calc Af Amer: 47 mL/min — ABNORMAL LOW (ref >60)
GFR, EST NON AFRICAN AMERICAN: 41 mL/min — AB (ref >60)
Glucose, Bld: 109 mg/dL — ABNORMAL HIGH (ref 70–99)
POTASSIUM: 4.7 mmol/L (ref 3.5–5.1)
Sodium: 137 mmol/L (ref 135–145)

## 2018-02-03 MED ORDER — SODIUM CHLORIDE 0.9 % IV SOLN: INTRAVENOUS | Status: DC

## 2018-02-03 MED ORDER — SODIUM CHLORIDE 0.45 % IV SOLN
INTRAVENOUS | Status: DC
  Administered 2018-02-03 – 2018-02-04 (×2): via INTRAVENOUS

## 2018-02-03 NOTE — Progress Notes (Signed)
Physical Therapy Session Note  Patient Details  Name: Vicki Stein MRN: [id number] Date of Birth: 1928-06-12  Today's Date: 02/03/2018 PT Individual Time: 1101-1116 and 1497-0263 PT Individual Time Calculation (min): 15 min and 23 min   Short Term Goals: Week 1:  PT Short Term Goal 1 (Week 1): =LTG due to ELOS  Skilled Therapeutic Interventions/Progress Updates:  Treatment 1: Pt received in w/c & agreeable to tx reporting feeling unwell but better than earlier OT session. PA (Pam) arrived & educated pt she is dehydrated with therapist providing & encouraging pt to consume water throughout session. Discussed d/c plans when IV team arrived to attempt to place IV. Assisted pt back to bed via stand pivot with extra time and close supervision. Pt transfers sit>supine with supervision and hospital bed features. Therapist exited to allow IV nurse to place IV.  Treatment 2: Therapist returned after IV nurse left & pt agreeable to tx. Pt reporting need to use restroom and ambulates within room & bathroom with RW & CGA>close supervision. Pt requires multiple attempts for sit>stand from EOB and continues to drag LLE when ambulating. Pt completes toilet transfer with supervision and with continent void. Pt reports vaginal bleeding is worse & PA (Pam) made aware. Pt attempts to place pad without wearing underwear or brief requiring cuing and assist to don brief. Pt requires extra time to figure out how to turn on faucet at sink to perform hand hygiene. Pt returned to supine in bed with supervision and extra time. Pt's neighbor Olivia Mackie) present reporting he will transport pt home upon d/c and inquiring about pt's status with therapist educating him pt & son report pt is at baseline level of function. Pt left in bed with alarm set & set up with meal tray, visitor present & call bell in reach.  Therapy Documentation Precautions:  Precautions Precautions: Fall Restrictions Weight Bearing Restrictions:  No   See Function Navigator for Current Functional Status.   Therapy/Group: Individual Therapy  [name] 02/03/2018, 12:58 PM

## 2018-02-03 NOTE — Progress Notes (Addendum)
Physical Therapy Session Note  Patient Details  Name: Vicki Stein MRN: [id number] Date of Birth: 11/28/1927  Today's Date: 02/03/2018 PT Individual Time: 1320-1346 PT Individual Time Calculation (min): 26 min   Short Term Goals: Week 1:  PT Short Term Goal 1 (Week 1): =LTG due to ELOS  Skilled Therapeutic Interventions/Progress Updates:  Pt in bed with IV team present attempting to place IV. Therapist returned later when IV nurse exiting & pt agreeable to tx. No c/o pain reported. Pt transferred supine>sitting EOB with hospital bed features & supervision. Pt transfers bed>w/c via stand pivot with close supervision. Pt requesting to use nu-step and utilized device on level 4 x 10 minutes with all four extremities with task focusing on increasing BLE joint ROM & endurance training. Pt does require assistance to place BLE on pedals. At end of session pt left sitting in w/c with call bell in reach.  Therapy Documentation Precautions:  Precautions Precautions: Fall Restrictions Weight Bearing Restrictions: No  General: PT Amount of Missed Time (min): 19 Minutes PT Missed Treatment Reason: (IV team)    See Function Navigator for Current Functional Status.   Therapy/Group: Individual Therapy  [name] 02/03/2018, 2:33 PM

## 2018-02-03 NOTE — Progress Notes (Signed)
Occupational Therapy Session Note  Patient Details  Name: Vicki Stein MRN: [id number] Date of Birth: 04-21-1928  Today's Date: 02/03/2018 OT Individual Time: 2707-8675 and 1400-1430 OT Individual Time Calculation (min): 55 min and 30 min   Short Term Goals: Week 1:  OT Short Term Goal 1 (Week 1): STG = LTGs due to ELOS  Skilled Therapeutic Interventions/Progress Updates:    1) Engaged in self-care tasks with focus on use of AE for increased independence.  Pt declined shower this session but willing to complete LB dressing.  Pt opted to dress from EOB, donning pants with increased time with use of reacher.  When attempting to don shoes pt reports feeling "different" this session, reporting more "clumsy" when attempting to cross legs to don shoes and feeling "light headed" and "dizzy" with self-care tasks seated EOB.  Vital assessed 156/51 and notified RN of pt reports.  Pt expressing "maybe I'm not ready to go home".  After a few min rest break, pt able to cross legs over opposite knee to don shoes.  Pt asking if she had potentially had another stroke overnight, deferred question to medical team to address.  Pt reports Rt hand "numb" but that is not new, still demonstrates decreased coordination in RUE but difficult to assess if different from previous sessions.  Pt left upright in w/c with RN present to administer medications.  2) Treatment session with focus on activity tolerance and standing balance.  Pt received upright in w/c reporting drinking more fluids this afternoon.  Pt willing to engage in jigsaw puzzle activity in standing.  Pt completed sit <> stand with supervision.  Pt tolerated standing 5 mins while completing jigsaw puzzle before reporting sensation that Lt knee was going to "give out".  Pt returned to sitting and completed jigsaw puzzle from sitting position utilizing RUE and LUE interchangeably.  Pt reports enjoying activity this session.  Pt left upright in w/c with seat belt  alarm fastened and all needs in reach.  Therapy Documentation Precautions:  Precautions Precautions: Fall Restrictions Weight Bearing Restrictions: No General:   Vital Signs: Therapy Vitals Pulse Rate: 90 BP: (!) 151/56 Patient Position (if appropriate): Sitting Pain:   ADL:   Vision   Perception    Praxis   Exercises:   Other Treatments:    See Function Navigator for Current Functional Status.   Therapy/Group: Individual Therapy  Simonne Come 02/03/2018, 10:44 AM

## 2018-02-03 NOTE — Progress Notes (Signed)
Subjective/Complaints: Up in bed. Had some dizziness with therapy this morning   ROS: Patient denies fever, rash, sore throat, blurred vision, nausea, vomiting, diarrhea, cough, shortness of breath or chest pain, joint or back pain, headache, or mood change.    Objective: Vital Signs: Blood pressure (!) 151/56, pulse 90, temperature 98.2 F (36.8 C), temperature source Oral, resp. rate 18, height _0  (1.575 m), weight 62.2 kg (137 lb 2 oz), SpO2 98 %. No results found. Results for orders placed or performed during the hospital encounter of 01/27/18 (from the past 72 hour(s))  Basic metabolic panel     Status: Abnormal   Collection Time: 02/02/18  5:05 AM  Result Value Ref Range   Sodium 141 135 - 145 mmol/L   Potassium 4.6 3.5 - 5.1 mmol/L   Chloride 114 (H) 98 - 111 mmol/L   CO2 18 (L) 22 - 32 mmol/L   Glucose, Bld 107 (H) 70 - 99 mg/dL   BUN 40 (H) 8 - 23 mg/dL   Creatinine, Ser 1.16 (H) 0.44 - 1.00 mg/dL   Calcium 8.9 8.9 - 10.3 mg/dL   GFR calc non Af Amer 41 (L) >60 mL/min   GFR calc Af Amer 47 (L) >60 mL/min    Comment: (NOTE) The eGFR has been calculated using the CKD EPI equation. This calculation has not been validated in all clinical situations. eGFR's persistently <60 mL/min signify possible Chronic Kidney Disease.    Anion gap 9 5 - 15    Comment: Performed at [facility] [address], [place], [place] [address]  CBC     Status: Abnormal   Collection Time: 02/02/18  5:05 AM  Result Value Ref Range   WBC 7.8 4.0 - 10.5 K/uL   RBC 3.50 (L) 3.87 - 5.11 MIL/uL   Hemoglobin 10.5 (L) 12.0 - 15.0 g/dL   HCT 33.0 (L) 36.0 - 46.0 %   MCV 94.3 78.0 - 100.0 fL   MCH 30.0 26.0 - 34.0 pg   MCHC 31.8 30.0 - 36.0 g/dL   RDW 13.4 11.5 - 15.5 %   Platelets 197 150 - 400 K/uL    Comment: Performed at [facility], [place] [address] [place], [place] [address]  Basic metabolic panel     Status: Abnormal   Collection Time: 02/03/18  5:55 AM  Result Value Ref  Range   Sodium 137 135 - 145 mmol/L   Potassium 4.7 3.5 - 5.1 mmol/L   Chloride 113 (H) 98 - 111 mmol/L   CO2 18 (L) 22 - 32 mmol/L   Glucose, Bld 109 (H) 70 - 99 mg/dL   BUN 40 (H) 8 - 23 mg/dL   Creatinine, Ser 1.16 (H) 0.44 - 1.00 mg/dL   Calcium 8.7 (L) 8.9 - 10.3 mg/dL   GFR calc non Af Amer 41 (L) >60 mL/min   GFR calc Af Amer 47 (L) >60 mL/min    Comment: (NOTE) The eGFR has been calculated using the CKD EPI equation. This calculation has not been validated in all clinical situations. eGFR's persistently <60 mL/min signify possible Chronic Kidney Disease.    Anion gap 6 5 - 15    Comment: Performed at [facility] [address], [place], [place] [address]     Constitutional: No distress . Vital signs reviewed. HEENT: EOMI, oral membranes moist Neck: supple Cardiovascular: RRR without murmur. No JVD    Respiratory: CTA Bilaterally without wheezes or rales. Normal effort  GI: BS +, non-tender, non-distended   Neuro:  HOH Alert, improved functional language but often with word finding deficits Motor: 4/5 RUE proximal to distal 5/5 LUE proximal to distal 3-/5 Bilateral HF, KE 2/5 ADF--stable Increased tone b/l LE tr to 1/4 Musc/Skel:  No edema or tenderness in extremities Psych: pleasant and appropriate  Assessment/Plan: 1. Functional deficits secondary to RUE weakness and B LE spastic weakness which require 3+ hours per day of interdisciplinary therapy in a comprehensive inpatient rehab setting. Physiatrist is providing close team supervision and 24 hour management of active medical problems listed below. Physiatrist and rehab team continue to assess barriers to discharge/monitor patient progress toward functional and medical goals. FIM: Function - Bathing Position: Shower Body parts bathed by patient: Right arm, Left arm, Chest, Abdomen, Front perineal area, Buttocks, Right upper leg, Left upper leg, Right lower leg, Left lower leg Body parts bathed by  helper: Back Bathing not applicable: Right lower leg, Left lower leg, Back Assist Level: Supervision or verbal cues  Function- Upper Body Dressing/Undressing What is the patient wearing?: Bra, Pull over shirt/dress Bra - Perfomed by patient: Thread/unthread right bra strap, Thread/unthread left bra strap, Hook/unhook bra (pull down sports bra) Pull over shirt/dress - Perfomed by patient: Thread/unthread right sleeve, Thread/unthread left sleeve, Put head through opening, Pull shirt over trunk Assist Level: Set up Set up : To obtain clothing/put away Function - Lower Body Dressing/Undressing What is the patient wearing?: Underwear, Pants, Shoes, Non-skid slipper socks Position: Sitting EOB Underwear - Performed by patient: Thread/unthread right underwear leg, Thread/unthread left underwear leg, Pull underwear up/down Underwear - Performed by helper: Thread/unthread right underwear leg, Thread/unthread left underwear leg Pants- Performed by patient: Thread/unthread left pants leg, Pull pants up/down, Thread/unthread right pants leg Pants- Performed by helper: Thread/unthread right pants leg Non-skid slipper socks- Performed by patient: Don/doff right sock, Don/doff left sock Socks - Performed by patient: Don/doff right sock, Don/doff left sock Socks - Performed by helper: Don/doff left sock Shoes - Performed by patient: Don/doff right shoe, Don/doff left shoe, Fasten right, Fasten left Shoes - Performed by helper: Don/doff right shoe, Don/doff left shoe, Fasten right, Fasten left Assist for footwear: Supervision/touching assist, Setup Assist for lower body dressing: Supervision or verbal cues, Set up  Function - Toileting Toileting steps completed by patient: Adjust clothing prior to toileting, Performs perineal hygiene, Adjust clothing after toileting Toileting Assistive Devices: Grab bar or rail Assist level: Supervision or verbal cues  Function - Air cabin crew transfer  assistive device: Walker, Grab bar Assist level to toilet: Supervision or verbal cues Assist level from toilet: Supervision or verbal cues  Function - Chair/bed transfer Chair/bed transfer method: Stand pivot Chair/bed transfer assist level: Supervision or verbal cues Chair/bed transfer assistive device: Walker Chair/bed transfer details: Verbal cues for sequencing, Verbal cues for technique, Verbal cues for precautions/safety  Function - Locomotion: Wheelchair Will patient use wheelchair at discharge?: No Max wheelchair distance: 125 ft Assist Level: Touching or steadying assistance (Pt > 75%) Assist Level: Touching or steadying assistance (Pt > 75%) Function - Locomotion: Ambulation Assistive device: Walker-rolling Max distance: 15 ft  Assist level: Supervision or verbal cues Assist level: Supervision or verbal cues Assist level: Supervision or verbal cues Walk 150 feet activity did not occur: Safety/medical concerns Assist level: Supervision or verbal cues Walk 10 feet on uneven surfaces activity did not occur: Safety/medical concerns Assist level: Supervision or verbal cues  Function - Comprehension Comprehension: Auditory Comprehension assist level: Understands basic 90% of  the time/cues < 10% of the time  Function - Expression Expression: Verbal Expression assist level: Expresses basic needs/ideas: With extra time/assistive device  Function - Social Interaction Social Interaction assist level: Interacts appropriately 90% of the time - Needs monitoring or encouragement for participation or interaction.  Function - Problem Solving Problem solving assist level: Solves basic 75 - 89% of the time/requires cueing 10 - 24% of the time  Function - Memory Memory assist level: Recognizes or recalls 90% of the time/requires cueing < 10% of the time Patient normally able to recall (first 3 days only): That he or she is in a hospital  Medical Problem List and Plan: 1.  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   Continue CIR 2.  DVT Prophylaxis/Anticoagulation: Pharmaceutical: Other (comment)--Eliquis 3. Pain Management: Tylenol No. 3 added on 7/27 per patient 4. Mood: LCSW to follow for evaluation and support.  5. Neuropsych: This patient is capable of making decisions on her own behalf. 6. Skin/Wound Care: routine pressure relief measures.  7. Fluids/Electrolytes/Nutrition: Monitor I/O.    -BUN still at 40  -given symptoms, IVF started  -recheck bmet in AM 8. HTN: Monitor BP bid. Vitals:   02/03/18 0426 02/03/18 1004  BP: (!) 121/50 (!) 151/56  Pulse: 73 90  Resp: 18   Temp: 98.2 F (36.8 C)   SpO2: 98%    lisinopril decreased to 20 mg due to low bp's  -normotenisve at present 9. Endometrial cancer: Treated non surgically--on megace tid.  Per pt her Gyn would like pt to have hysterectomy ~20mopost CVA 10. Anemia: Continue to monitor for stability.   Hemoglobin 10.5 7/29  Continue to monitor 11.  Spastic paraparesis  Continue baclofen low dose, may need to titrate upward.    Monitor mental status.   LOS (Days) 7 A FACE TO FACE EVALUATION WAS PERFORMED  ZMeredith Staggers7/30/2019, 1:24 PM

## 2018-02-03 NOTE — Progress Notes (Signed)
Physical Therapy Discharge Summary  Patient Details  Name: Vicki Stein MRN: [id number] Date of Birth: 03/21/1928  Today's Date: 02/03/2018    Patient has met 5 of 5 long term goals due to improved activity tolerance, increased strength and ability to compensate for deficits.  Patient to discharge at an ambulatory level Supervision with RW for household distances. Pt's son was in for session and reports pt is at baseline level of function and is aware of supervision recommendations & pt's CLOF. Pts neighbor educated on techniques and cues to provide for stair negotiation. Recommended 24/7 supervision upon d/c.   All goals met.   Recommendation:  Patient will benefit from ongoing skilled PT services in home health setting to continue to advance safe functional mobility, address ongoing impairments in decreased endurance, decreased dynamic balance, stair negotiation, and minimize fall risk.  Equipment: No equipment provided - pt reports she has all DME at home  Reasons for discharge: treatment goals met and discharge from hospital  Patient/family agrees with progress made and goals achieved: Yes   PT Discharge Precautions/Restrictions Precautions Precautions: Fall Restrictions Weight Bearing Restrictions: No  Vital Signs Therapy Vitals Pulse Rate: 90 BP: (!) 151/56 Patient Position (if appropriate): Sitting   Pain   denies pain   Vision/Perception  Pt reports she wears glasses at all times at baseline and denies any changes in baseline vision. Hx of cataracts.  Cognition Overall Cognitive Status: History of cognitive impairments - at baseline(son reports pt with some confusion beginning PTA) Arousal/Alertness: Awake/alert Orientation Level: Oriented X4 Memory: Impaired Awareness: Impaired Awareness Impairment: Anticipatory impairment Problem Solving: Impaired Safety/Judgment: Impaired  Sensation Sensation Light Touch: Impaired by gross assessment(pt reports  intermittent numbness in R hand) Coordination Gross Motor Movements are Fluid and Coordinated: No Fine Motor Movements are Fluid and Coordinated: No   Motor  Motor Motor: Abnormal tone(hx of congential spastic paraplegia) Motor - Skilled Clinical Observations: extensor tone in BLE   Mobility Bed Mobility Bed Mobility: Supine to Sit;Sit to Supine(hospital bed) Supine to Sit: Supervision/Verbal cueing Sit to Supine: Supervision/Verbal cueing Transfers Transfers: Sit to Stand;Stand to Sit;Stand Pivot Transfers Sit to Stand: Supervision/Verbal cueing(with armrests) Stand to Sit: Supervision/Verbal cueing(with armrests) Stand Pivot Transfers: Supervision/Verbal cueing  Locomotion  Gait Ambulation: Yes Gait Assistance: Supervision/Verbal cueing Gait Distance (Feet): 50 Feet Assistive device: Rolling walker Gait Gait: Yes Gait Pattern: Decreased step length - right;Decreased step length - left;Decreased stride length;Decreased hip/knee flexion - right;Decreased hip/knee flexion - left;Decreased dorsiflexion - left;Decreased dorsiflexion - right;Decreased weight shift to left;Decreased weight shift to right;Shuffle;Poor foot clearance - right;Poor foot clearance - left(hx of baseline gait impairments 2/2 congential spastic paraplegia) Gait velocity: decreased Stairs / Additional Locomotion Stairs: Yes Stairs Assistance: Minimal Assistance - Patient > 75% Stair Management Technique: One rail Left Number of Stairs: 4 Height of Stairs: 3(inches)   Trunk/Postural Assessment  Cervical Assessment Cervical Assessment: Exceptions to WFL(forward head) Thoracic Assessment Thoracic Assessment: Within Functional Limits Lumbar Assessment Lumbar Assessment: Exceptions to WFL(posterior pelvis) Postural Control Postural Control: Deficits on evaluation Righting Reactions: impaired   Balance Balance Balance Assessed: Yes Static Sitting Balance Static Sitting - Level of Assistance: 5: Stand  by assistance Dynamic Sitting Balance Dynamic Sitting - Level of Assistance: 5: Stand by assistance Static Standing Balance Static Standing - Level of Assistance: 5: Stand by assistance Dynamic Standing Balance Dynamic Standing - Level of Assistance: 5: Stand by assistance  Extremity Assessment      RLE Assessment RLE Assessment: Exceptions to [facility] Passive Range  of Motion (PROM) Comments: limited at baseline but decreased since CVA General Strength Comments: decreased RLE PROM (degrees) Overall PROM Right Lower Extremity: Deficits;Due to premorbid status LLE Assessment LLE Assessment: Exceptions to [facility] Passive Range of Motion (PROM) Comments: limited at baseline but decreased since CVA General Strength Comments: decreased LLE PROM (degrees) Overall PROM Left Lower Extremity: Deficits;Due to premorbid status   See Function Navigator for Current Functional Status.   Netta Corrigan, PT, DPT 02/04/18 11:00  Vicki Stein 02/03/2018, 1:40 PM

## 2018-02-03 NOTE — Discharge Summary (Signed)
Physician Discharge Summary  Patient ID: Vicki Stein MRN: [id number] DOB/AGE: 09-28-1927 84 y.o.  Admit date: 01/27/2018 Discharge date: 02/04/2018  Discharge Diagnoses:  Principal Problem:   Embolic stroke [facility]) Active Problems:   Hereditary spastic paraparesis (HCC)   Endometrial adenocarcinoma ([place])   Pulmonary embolism (HCC)   Anemia of chronic disease   Chronic pain syndrome   Vaginal bleeding, abnormal   Discharged Condition: stable   Significant Diagnostic Studies: N/A   Labs:  Basic Metabolic Panel: Recent Labs  Lab 02/02/18 0505 02/03/18 0555 02/04/18 0544  NA 141 137 135  K 4.6 4.7 4.3  CL 114* 113* 111  CO2 18* 18* 17*  GLUCOSE 107* 109* 95  BUN 40* 40* 27*  CREATININE 1.16* 1.16* 1.00  CALCIUM 8.9 8.7* 8.2*    CBC: CBC Latest Ref Rng & Units 02/02/2018 01/28/2018 01/27/2018  WBC 4.0 - 10.5 K/uL 7.8 8.4 6.7  Hemoglobin 12.0 - 15.0 g/dL 10.5(L) 11.3(L) 10.3(L)  Hematocrit 36.0 - 46.0 % 33.0(L) 36.2 32.5(L)  Platelets 150 - 400 K/uL 197 207 166    CBG: No results for input(s): GLUCAP in the last 168 hours.  Brief HPI:   Vicki Stein is an 84 year old RH female with history of hereditary spastic paraplegia, uterine cancer treated non-surgically and recently started on megace, HTN, CVA; who was admitted on 01/25/2018 with chest pain, blurred vision dizziness as well as numbness and weakness of bilateral upper extremities.  MRI of brain done showing multiple small acute to early subacute infarct in cerebellar and cerebral and cerebellar hemisphere question of watershed ischemia due to hypoperfusion.  CT of chest showed small right lower lobe PE and she was started on Eliquis for treatment.  Dr. Leonie Man felt that stroke was likely cardioembolic v/s due to hypercoagulable state.  Patient continued to be limited by bilateral upper extremity weakness with poor safety awareness, instability and impulsivity with high fall risk.  CIR was recommended due to functional  decline.   Hospital Course: [name] was admitted to rehab 01/27/2018 for inpatient therapies to consist of PT and OT at least three hours five days a week. Past admission physiatrist, therapy team and rehab RN have worked together to provide customized collaborative inpatient rehab.  Eliquis was maintained during the stay and she is tolerating this without significant side effects.  She continues to have vaginal bleeding and was advised to follow-up with GYN if bleeding increases as now on anticoagulation.  Serial CBC shows H&H and platelets are stable.  Baclofen was added to help manage spastic paraparesis.   Blood pressures have been monitored on twice daily basis and lisinopril was decreased due to hypotension.  Protein supplements were added due to low calorie malnutrition.  She was found to have prerenal azotemia and was treated with 1 L of IV fluids to help with hydration.  She has been educated on importance of increasing fluid intake after discharge.  Patient has been highly anxious to be discharged to home and goals were downgraded as family felt that patient was at baseline.  She currently requires supervision with all activity.  She will continue to receive further follow-up home health PT and OT by [place] [facility] after discharge.   Rehab course: During patient's stay in rehab team conference was held to monitor patient's progress, set goals and discuss barriers to discharge. At admission, patient required min assist with basic self care task and mobility. She has had improvement in activity tolerance, balance, postural  control as well as ability to compensate for deficits. OT has attempted to educate patient on use of AE to decrease burden of care with ADL tasks but patient was reluctant and has declined attempts at education. She is able to complete ADL tasks with min assist to supervision.  She is able to perform transfers with supervision and verbal cues and is able to ambulate 29'  with supervision. She declined trial of AFO's to assist with foot clearance.  Family education was completed with son regarding need for 24 hours supervision. He felt that patient was at baseline and plans to discuss transition to ALF in the future.    Disposition: Home  Diet: Heart Healthy  Special Instructions: 1. Needs supervision for safety. 2. Contact GYN if vaginal bleeding increases. 3. Will need BMET rechecked at post hospital follow up.   Discharge Instructions    Ambulatory referral to Physical Medicine Rehab   Complete by:  As directed    1-2 weeks transitional care appt     Allergies as of 02/04/2018      Reactions   Aleve [naproxen Sodium] Itching, Swelling   Bactrim [sulfamethoxazole-trimethoprim] Itching, Other (See Comments)   Had a bad reaction, can't remember, worked me over   Motrin [ibuprofen] Itching, Swelling   Penicillins Hives, Itching, Other (See Comments)   Has patient had a PCN reaction causing immediate rash, facial/tongue/throat swelling, SOB or lightheadedness with hypotension: No Has patient had a PCN reaction causing severe rash involving mucus membranes or skin necrosis: No Has patient had a PCN reaction that required hospitalization No Has patient had a PCN reaction occurring within the last 10 years: No If all of the above answers are "NO", then may proceed with Cephalosporin use.   Ultram [tramadol] Other (See Comments)   Itvhing and tongue swelling.      Medication List    STOP taking these medications   acetaminophen-codeine 300-30 MG tablet Commonly known as:  TYLENOL #3   diclofenac sodium 1 % Gel Commonly known as:  VOLTAREN   diphenhydrAMINE 25 MG tablet Commonly known as:  BENADRYL   dipyridamole-aspirin 200-25 MG 12hr capsule Commonly known as:  AGGRENOX   ondansetron 4 MG disintegrating tablet Commonly known as:  ZOFRAN-ODT     TAKE these medications   acetaminophen 500 MG tablet Commonly known as:  TYLENOL Take 500  mg by mouth every 6 (six) hours as needed for mild pain or moderate pain.   amLODipine 2.5 MG tablet Commonly known as:  NORVASC Take 1 tablet (2.5 mg total) by mouth daily. What changed:    when to take this  additional instructions   apixaban 5 MG Tabs tablet Commonly known as:  ELIQUIS Take 1 tablet (5 mg total) by mouth 2 (two) times daily.   atorvastatin 40 MG tablet Commonly known as:  LIPITOR Take 1 tablet (40 mg total) by mouth every evening.   Baclofen 5 MG Tabs Take 5 mg by mouth 3 (three) times daily. Notes to patient:  For muscle spasms/tightness.    docusate sodium 100 MG capsule Commonly known as:  COLACE Take 1 capsule (100 mg total) by mouth 2 (two) times daily.   fluticasone 50 MCG/ACT nasal spray Commonly known as:  FLONASE Place 2 sprays into both nostrils daily.   lisinopril 40 MG tablet Commonly known as:  PRINIVIL,ZESTRIL Take 0.5 tablets (20 mg total) by mouth every morning. What changed:  how much to take   meclizine 25 MG tablet Commonly known as:  ANTIVERT Take 1 tablet (25 mg total) by mouth 2 (two) times daily as needed for dizziness. What changed:    when to take this  reasons to take this   megestrol 40 MG tablet Commonly known as:  MEGACE Take 1 tablet (40 mg total) by mouth 3 (three) times daily.   MUSCLE RUB 10-15 % Crea Apply 1 application topically 2 (two) times daily.   omeprazole 40 MG capsule Commonly known as:  PRILOSEC Take 40 mg by mouth at bedtime.      Follow-up Information    Kirsteins, Luanna Salk, MD Follow up.   Specialty:  Physical Medicine and Rehabilitation Why:  Office will call with follow up appointment Contact information: [place] [address] [phone]        Garvin Fila, MD. Call.   Specialties:  Neurology, Radiology Why:  for follow up appointment Contact information: [place] [address] [phone]        Glenford Bayley, DO.  Go on 02/09/2018.   Specialty:  Family Medicine Why:  @ 4:15 PM. Needs BMET/CBC rechecked for stabilty.  Contact information: [place] [address] [phone]           Signed: Bary Leriche 02/04/2018, 4:38 PM

## 2018-02-04 ENCOUNTER — Inpatient Hospital Stay (HOSPITAL_COMMUNITY): Payer: Medicare Other | Admitting: Occupational Therapy

## 2018-02-04 ENCOUNTER — Encounter (HOSPITAL_COMMUNITY): Payer: Medicare Other | Admitting: Psychology

## 2018-02-04 ENCOUNTER — Inpatient Hospital Stay (HOSPITAL_COMMUNITY): Payer: Medicare Other

## 2018-02-04 DIAGNOSIS — R7989 Other specified abnormal findings of blood chemistry: Secondary | ICD-10-CM

## 2018-02-04 LAB — BASIC METABOLIC PANEL
Anion gap: 7 (ref 5–15)
BUN: 27 mg/dL — ABNORMAL HIGH (ref 8–23)
CALCIUM: 8.2 mg/dL — AB (ref 8.9–10.3)
CO2: 17 mmol/L — AB (ref 22–32)
Chloride: 111 mmol/L (ref 98–111)
Creatinine, Ser: 1 mg/dL (ref 0.44–1.00)
GFR calc Af Amer: 57 mL/min — ABNORMAL LOW (ref >60)
GFR calc non Af Amer: 49 mL/min — ABNORMAL LOW (ref >60)
GLUCOSE: 95 mg/dL (ref 70–99)
Potassium: 4.3 mmol/L (ref 3.5–5.1)
Sodium: 135 mmol/L (ref 135–145)

## 2018-02-04 MED ORDER — APIXABAN 5 MG PO TABS: 5.0000 mg | ORAL_TABLET | Freq: Two times a day (BID) | ORAL | 0 refills | Status: DC

## 2018-02-04 MED ORDER — LISINOPRIL 40 MG PO TABS: 20.0000 mg | ORAL_TABLET | Freq: Every morning | ORAL | 0 refills | Status: DC

## 2018-02-04 MED ORDER — MUSCLE RUB 10-15 % EX CREA: 1.0000 "application " | TOPICAL_CREAM | Freq: Two times a day (BID) | CUTANEOUS | 0 refills | Status: DC

## 2018-02-04 MED ORDER — DOCUSATE SODIUM 100 MG PO CAPS: 100.0000 mg | ORAL_CAPSULE | Freq: Two times a day (BID) | ORAL | 0 refills | Status: DC

## 2018-02-04 MED ORDER — ATORVASTATIN CALCIUM 40 MG PO TABS: 40.0000 mg | ORAL_TABLET | Freq: Every evening | ORAL | 0 refills | Status: AC

## 2018-02-04 MED ORDER — AMLODIPINE BESYLATE 2.5 MG PO TABS: 2.5000 mg | ORAL_TABLET | Freq: Every day | ORAL | 0 refills | Status: DC

## 2018-02-04 MED ORDER — MECLIZINE HCL 25 MG PO TABS: 25.0000 mg | ORAL_TABLET | Freq: Two times a day (BID) | ORAL | 0 refills | Status: AC | PRN

## 2018-02-04 MED ORDER — BACLOFEN 5 MG PO TABS: 5.0000 mg | ORAL_TABLET | Freq: Three times a day (TID) | ORAL | 0 refills | Status: AC

## 2018-02-04 NOTE — Progress Notes (Signed)
Occupational Therapy Discharge Summary  Patient Details  Name: Vicki Stein MRN: [id number] Date of Birth: [birth year]  Patient has met 7 of 10 long term goals due to improved activity tolerance, improved balance, postural control and ability to compensate for deficits.  Patient to discharge at [facility]- supervision level.  Patient's care partner is independent to provide the necessary supervision assistance at discharge.  Patient's son was present on 02/02/18 and reports pt is at baseline level of function with mobility and (per report) self-care tasks.  Pt and pt's son report that pt has good days and bad days and she has always been able to modify her routine and complete tasks independently.  Recommend supervision level with pt and son aware of recommendations and son [name]) reporting he will be able to provide supervision "initially".    Reasons goals not met: Pt required min assist with shower transfers and supervision for bathing/dressing.  Recommendation:  Patient will benefit from ongoing skilled OT services in home health setting to continue to advance functional skills in the area of BADL and Reduce care partner burden.  Equipment: No equipment provided  Reasons for discharge: treatment goals met and discharge from hospital  Patient/family agrees with progress made and goals achieved: Yes      See Function Navigator for Current Functional Status.  Simonne Come 02/04/2018, 12:30 PM

## 2018-02-04 NOTE — Plan of Care (Signed)
  Problem: RH SAFETY Goal: RH STG ADHERE TO SAFETY PRECAUTIONS W/ASSISTANCE/DEVICE Description STG Adhere to Safety Precautions With min Assistance and appropriate assistive Device.  Outcome: Progressing  Call light within reach, bed alarm, proper footwear.

## 2018-02-04 NOTE — Progress Notes (Signed)
Occupational Therapy Session Note  Patient Details  Name: Vicki Stein MRN: [id number] Date of Birth: 1928-05-15  Today's Date: 02/04/2018 OT Individual Time: 0100-7121 and 9758-8325 OT Individual Time Calculation (min): 20 min and 15 min   Short Term Goals: Week 1:  OT Short Term Goal 1 (Week 1): STG = LTGs due to ELOS  Skilled Therapeutic Interventions/Progress Updates:    1)Treatment session with focus on functional mobility and independence with self-care tasks.  Pt received supine in bed reporting her clothes had been washed but not yet returned to her, therefore she was unwilling to engage in therapy until dressed.  Able to encourage pt to complete short distance ambulation in room to complete transfers on/off toilet.  Pt ambulated to bathroom with RW with close supervision, dragging LLE intermittently but completing mobility without LOB.  Completed toilet transfers and management of incontinence brief with min cues as pt typically wears Depends style pullups.  Returned to bed and pt requesting to wait until clothing dry.  2) Therapist returned to clean, dry clothing.  Pt completed LB dressing seated at EOB donning pants with increased time and use of reacher.  Pt able to cross BLE over opposite knee to don socks and shoes requiring increased time due to stiffness but ultimately able to cross leg over knee.  Pt passed off to PT.  Pt's neighbor arriving to transport pt home, he also reports that pt is basically at baseline and that he plans to assist/ensure pt safety upon return home.  Therapy Documentation Precautions:  Precautions Precautions: Fall Restrictions Weight Bearing Restrictions: No General:   Vital Signs: Therapy Vitals Pulse Rate: 82 Resp: 20 BP: (!) 113/52 Patient Position (if appropriate): Sitting Oxygen Therapy SpO2: (!) 85 % O2 Device: Room Air Pain: Pain Assessment Pain Scale: 0-10 Pain Score: 0-No pain  See Function Navigator for Current Functional  Status.   Therapy/Group: Individual Therapy  Simonne Come 02/04/2018, 12:24 PM

## 2018-02-04 NOTE — Discharge Instructions (Signed)
Inpatient Rehab Discharge Instructions  Vicki Stein Discharge date and time:  02/04/18  Activities/Precautions/ Functional Status: Activity: no lifting, driving, or strenuous exercise for till cleared by MD Diet: cardiac diet. Need to drink plenty of water daily--at least 1500 cc Wound Care: none needed   Functional status:  ___ No restrictions     ___ Walk up steps independently _X__ 24/7 supervision/assistance   ___ Walk up steps with assistance ___ Intermittent supervision/assistance  ___ Bathe/dress independently ___ Walk with walker     _X__ Bathe/dress with assistance ___ Walk Independently    ___ Shower independently ___ Walk with assistance    ___ Shower with assistance _X__ No alcohol     ___ Return to work/school ________   COMMUNITY REFERRALS UPON DISCHARGE:   Home Health:   PT     OT            Agency:  [facility] Phone:  [phone] Medical Equipment/Items Ordered:  Pt has all recommended DME already at home.  GENERAL COMMUNITY RESOURCES FOR PATIENT/FAMILY: Support Groups:  [facility] Stroke Support Group                              Meets the second Thursday of each month (except June, July, August) from 6PM-7PM                              In the dayroom of [place] at [facility], [address]                              For more information, please call [phone]  Special Instructions: 1. Drink plenty of fluids.  2. Follow up with gynecology if vaginal bleeding gets worse. You are on blood thinners due to blood clot in the lung.    STROKE/TIA DISCHARGE INSTRUCTIONS SMOKING Cigarette smoking nearly doubles your risk of having a stroke & is the single most alterable risk factor  If you smoke or have smoked in the last 12 months, you are advised to quit smoking for your health.  Most of the excess cardiovascular risk related to smoking disappears within a year of stopping.  Ask you doctor about anti-smoking  medications  [place] Quit Line: 1-800-QUIT NOW  Free Smoking Cessation Classes [phone]  CHOLESTEROL Know your levels; limit fat & cholesterol in your diet  Lipid Panel     Component Value Date/Time   CHOL 103 01/25/2018 0012   TRIG 74 01/25/2018 0012   HDL 33 (L) 01/25/2018 0012   CHOLHDL 3.1 01/25/2018 0012   VLDL 15 01/25/2018 0012   LDLCALC 55 01/25/2018 0012      Many patients benefit from treatment even if their cholesterol is at goal.  Goal: Total Cholesterol (CHOL) less than 160  Goal:  Triglycerides (TRIG) less than 150  Goal:  HDL greater than 40  Goal:  LDL (LDLCALC) less than 100   BLOOD PRESSURE American Stroke Association blood pressure target is less that 120/80 mm/Hg  Your discharge blood pressure is:  BP: 126/64  Monitor your blood pressure  Limit your salt and alcohol intake  Many individuals will require more than one medication for high blood pressure  DIABETES (A1c is a blood sugar average for last 3 months) Goal HGBA1c is under 7% (HBGA1c is blood  sugar average for last 3 months)  Diabetes: No known diagnosis of diabetes    Lab Results  Component Value Date   HGBA1C 5.8 (H) 01/25/2018     Your HGBA1c can be lowered with medications, healthy diet, and exercise.  Check your blood sugar as directed by your physician  Call your physician if you experience unexplained or low blood sugars.  PHYSICAL ACTIVITY/REHABILITATION Goal is 30 minutes at least 4 days per week  Activity: No driving, Therapies: see above Return to work: N/A  Activity decreases your risk of heart attack and stroke and makes your heart stronger.  It helps control your weight and blood pressure; helps you relax and can improve your mood.  Participate in a regular exercise program.  Talk with your doctor about the best form of exercise for you (dancing, walking, swimming, cycling).  DIET/WEIGHT Goal is to maintain a healthy weight  Your discharge diet is:  Diet Order            Diet Heart Room service appropriate? Yes; Fluid consistency: Thin  Diet effective now          liquids Your height is:  Height: 5\' 2"  (157.5 cm) Your current weight is: Weight: 62.2 kg (137 lb 2 oz) Your Body Mass Index (BMI) is:  BMI (Calculated): 25.07  Following the type of diet specifically designed for you will help prevent another stroke.  You are at goal weight range   Your goal Body Mass Index (BMI) is 19-24.  Healthy food habits can help reduce 3 risk factors for stroke:  High cholesterol, hypertension, and excess weight.  RESOURCES Stroke/Support Group:  Call [phone]   STROKE EDUCATION PROVIDED/REVIEWED AND GIVEN TO PATIENT Stroke warning signs and symptoms How to activate emergency medical system (call 911). Medications prescribed at discharge. Need for follow-up after discharge. Personal risk factors for stroke. Pneumonia vaccine given:  Flu vaccine given:  My questions have been answered, the writing is legible, and I understand these instructions.  I will adhere to these goals & educational materials that have been provided to me after my discharge from the hospital.     My questions have been answered and I understand these instructions. I will adhere to these goals and the provided educational materials after my discharge from the hospital.  Patient/Caregiver Signature _______________________________ Date __________  Clinician Signature _______________________________________ Date __________  Information on my medicine - ELIQUIS (apixaban)   Why was Eliquis prescribed for you? Eliquis was prescribed for you to reduce the risk of forming blood clots that can cause a stroke.  What do You need to know about Eliquis ? Take your Eliquis TWICE DAILY - one tablet in the morning and one tablet in the evening with or without food.  It would be best to take the doses about the same time each day.  If you have difficulty swallowing the tablet whole please  discuss with your pharmacist how to take the medication safely.  Take Eliquis exactly as prescribed by your doctor and DO NOT stop taking Eliquis without talking to the doctor who prescribed the medication.  Stopping may increase your risk of developing a new clot or stroke.  Refill your prescription before you run out.  After discharge, you should have regular check-up appointments with your healthcare provider that is prescribing your Eliquis.  In the future your dose may need to be changed if your kidney function or weight changes by a significant amount or as you get older.  What  do you do if you miss a dose? If you miss a dose, take it as soon as you remember on the same day and resume taking twice daily.  Do not take more than one dose of ELIQUIS at the same time.  Important Safety Information A possible side effect of Eliquis is bleeding. You should call your healthcare provider right away if you experience any of the following: ? Bleeding from an injury or your nose that does not stop. ? Unusual colored urine (red or dark brown) or unusual colored stools (red or black). ? Unusual bruising for unknown reasons. ? A serious fall or if you hit your head (even if there is no bleeding).  Some medicines may interact with Eliquis and might increase your risk of bleeding or clotting while on Eliquis. To help avoid this, consult your healthcare provider or pharmacist prior to using any new prescription or non-prescription medications, including herbals, vitamins, non-steroidal anti-inflammatory drugs (NSAIDs) and supplements.  This website has more information on Eliquis (apixaban): [email or web address].

## 2018-02-04 NOTE — Progress Notes (Signed)
Subjective/Complaints: No new complaints. Anxious to get home!! Some issues getting IV started last night  ROS: limited due to language/communication    Objective: Vital Signs: Blood pressure (!) 132/59, pulse 70, temperature 99.2 F (37.3 C), temperature source Oral, resp. rate 16, height _0  (1.575 m), weight 62.2 kg (137 lb 2 oz), SpO2 100 %. No results found. Results for orders placed or performed during the hospital encounter of 01/27/18 (from the past 72 hour(s))  Basic metabolic panel     Status: Abnormal   Collection Time: 02/02/18  5:05 AM  Result Value Ref Range   Sodium 141 135 - 145 mmol/L   Potassium 4.6 3.5 - 5.1 mmol/L   Chloride 114 (H) 98 - 111 mmol/L   CO2 18 (L) 22 - 32 mmol/L   Glucose, Bld 107 (H) 70 - 99 mg/dL   BUN 40 (H) 8 - 23 mg/dL   Creatinine, Ser 1.16 (H) 0.44 - 1.00 mg/dL   Calcium 8.9 8.9 - 10.3 mg/dL   GFR calc non Af Amer 41 (L) >60 mL/min   GFR calc Af Amer 47 (L) >60 mL/min    Comment: (NOTE) The eGFR has been calculated using the CKD EPI equation. This calculation has not been validated in all clinical situations. eGFR's persistently <60 mL/min signify possible Chronic Kidney Disease.    Anion gap 9 5 - 15    Comment: Performed at [facility] [address] [place], [place] [address]  CBC     Status: Abnormal   Collection Time: 02/02/18  5:05 AM  Result Value Ref Range   WBC 7.8 4.0 - 10.5 K/uL   RBC 3.50 (L) 3.87 - 5.11 MIL/uL   Hemoglobin 10.5 (L) 12.0 - 15.0 g/dL   HCT 33.0 (L) 36.0 - 46.0 %   MCV 94.3 78.0 - 100.0 fL   MCH 30.0 26.0 - 34.0 pg   MCHC 31.8 30.0 - 36.0 g/dL   RDW 13.4 11.5 - 15.5 %   Platelets 197 150 - 400 K/uL    Comment: Performed at [facility], [place] [address], [place], [place] [address]  Basic metabolic panel     Status: Abnormal   Collection Time: 02/03/18  5:55 AM  Result Value Ref Range   Sodium 137 135 - 145 mmol/L   Potassium 4.7 3.5 - 5.1 mmol/L   Chloride 113 (H) 98 - 111 mmol/L    CO2 18 (L) 22 - 32 mmol/L   Glucose, Bld 109 (H) 70 - 99 mg/dL   BUN 40 (H) 8 - 23 mg/dL   Creatinine, Ser 1.16 (H) 0.44 - 1.00 mg/dL   Calcium 8.7 (L) 8.9 - 10.3 mg/dL   GFR calc non Af Amer 41 (L) >60 mL/min   GFR calc Af Amer 47 (L) >60 mL/min    Comment: (NOTE) The eGFR has been calculated using the CKD EPI equation. This calculation has not been validated in all clinical situations. eGFR's persistently <60 mL/min signify possible Chronic Kidney Disease.    Anion gap 6 5 - 15    Comment: Performed at [facility] [address], [place], [place] [address]     Constitutional: No distress . Vital signs reviewed. HEENT: EOMI, oral membranes moist Neck: supple Cardiovascular: RRR without murmur. No JVD    Respiratory: CTA Bilaterally without wheezes or rales. Normal effort    GI: BS +, non-tender, non-distended   Neuro:  HOH Word finding deficits Motor: 4/5 RUE proximal  to distal 5/5 LUE proximal to distal 3-/5 Bilateral HF, KE 2/5 ADF--stable Increased tone b/l LE tr to 1/4 Musc/Skel:  No edema or tenderness in extremities Psych: pleasant and appropriate  Assessment/Plan: 1. Functional deficits secondary to RUE weakness and B LE spastic weakness which require 3+ hours per day of interdisciplinary therapy in a comprehensive inpatient rehab setting. Physiatrist is providing close team supervision and 24 hour management of active medical problems listed below. Physiatrist and rehab team continue to assess barriers to discharge/monitor patient progress toward functional and medical goals. FIM: Function - Bathing Position: Shower Body parts bathed by patient: Right arm, Left arm, Chest, Abdomen, Front perineal area, Buttocks, Right upper leg, Left upper leg, Right lower leg, Left lower leg Body parts bathed by helper: Back Bathing not applicable: Right lower leg, Left lower leg, Back Assist Level: Supervision or verbal cues  Function- Upper Body  Dressing/Undressing What is the patient wearing?: Bra, Pull over shirt/dress Bra - Perfomed by patient: Thread/unthread right bra strap, Thread/unthread left bra strap, Hook/unhook bra (pull down sports bra) Pull over shirt/dress - Perfomed by patient: Thread/unthread right sleeve, Thread/unthread left sleeve, Put head through opening, Pull shirt over trunk Assist Level: Set up Set up : To obtain clothing/put away Function - Lower Body Dressing/Undressing What is the patient wearing?: Underwear, Pants, Shoes, Non-skid slipper socks Position: Sitting EOB Underwear - Performed by patient: Thread/unthread right underwear leg, Thread/unthread left underwear leg, Pull underwear up/down Underwear - Performed by helper: Thread/unthread right underwear leg, Thread/unthread left underwear leg Pants- Performed by patient: Thread/unthread left pants leg, Pull pants up/down, Thread/unthread right pants leg Pants- Performed by helper: Thread/unthread right pants leg Non-skid slipper socks- Performed by patient: Don/doff right sock, Don/doff left sock Socks - Performed by patient: Don/doff right sock, Don/doff left sock Socks - Performed by helper: Don/doff left sock Shoes - Performed by patient: Don/doff right shoe, Don/doff left shoe, Fasten right, Fasten left Shoes - Performed by helper: Don/doff right shoe, Don/doff left shoe, Fasten right, Fasten left Assist for footwear: Supervision/touching assist, Setup Assist for lower body dressing: Supervision or verbal cues, Set up  Function - Toileting Toileting steps completed by patient: Adjust clothing prior to toileting, Performs perineal hygiene, Adjust clothing after toileting Toileting Assistive Devices: Grab bar or rail Assist level: Supervision or verbal cues  Function - Air cabin crew transfer assistive device: Environmental consultant, Grab bar Assist level to toilet: Supervision or verbal cues Assist level from toilet: Supervision or verbal  cues  Function - Chair/bed transfer Chair/bed transfer method: Stand pivot Chair/bed transfer assist level: Supervision or verbal cues Chair/bed transfer assistive device: Walker Chair/bed transfer details: Verbal cues for sequencing, Verbal cues for technique, Verbal cues for precautions/safety  Function - Locomotion: Wheelchair Will patient use wheelchair at discharge?: No Max wheelchair distance: 125 ft Assist Level: Touching or steadying assistance (Pt > 75%) Assist Level: Touching or steadying assistance (Pt > 75%) Function - Locomotion: Ambulation Assistive device: Walker-rolling Max distance: 15 ft  Assist level: Supervision or verbal cues Assist level: Supervision or verbal cues Assist level: Supervision or verbal cues Walk 150 feet activity did not occur: Safety/medical concerns Assist level: Supervision or verbal cues Walk 10 feet on uneven surfaces activity did not occur: Safety/medical concerns Assist level: Supervision or verbal cues  Function - Comprehension Comprehension: Auditory Comprehension assist level: Understands basic 90% of the time/cues < 10% of the time  Function - Expression Expression: Verbal Expression assist level: Expresses basic needs/ideas: With extra time/assistive device  Function - Social Interaction Social Interaction assist level: Interacts appropriately 90% of the time - Needs monitoring or encouragement for participation or interaction.  Function - Problem Solving Problem solving assist level: Solves basic 75 - 89% of the time/requires cueing 10 - 24% of the time  Function - Memory Memory assist level: Recognizes or recalls 90% of the time/requires cueing < 10% of the time Patient normally able to recall (first 3 days only): That he or she is in a hospital  Medical Problem List and Plan: 1.  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   Home today if labs ok  -Patient to see Rehab MD/provider in the office for transitional care encounter in 1-2 weeks.  2.  DVT Prophylaxis/Anticoagulation: Pharmaceutical: Other (comment)--Eliquis 3. Pain Management: Tylenol No. 3 added on 7/27 per patient 4. Mood: LCSW to follow for evaluation and support.  5. Neuropsych: This patient is capable of making decisions on her own behalf. 6. Skin/Wound Care: routine pressure relief measures.  7. Fluids/Electrolytes/Nutrition: Monitor I/O.    -prerenal azotemia  -given symptoms, IVF started  -f/u bmet pending 8. HTN: Monitor BP bid. Vitals:   02/03/18 2242 02/04/18 0424  BP: (!) 120/45 (!) 132/59  Pulse: 70 70  Resp: 17 16  Temp: 98 F (36.7 C) 99.2 F (37.3 C)  SpO2: 98% 100%   lisinopril decreased to 20 mg due to low bp's  -normotenisve at present 9. Endometrial cancer: Treated non surgically--on megace tid.  Per pt her Gyn would like pt to have hysterectomy ~30mopost CVA 10. Anemia: Continue to monitor for stability.   Hemoglobin 10.5 7/29  Continue to monitor 11.  Spastic paraparesis  Continue baclofen low dose, may need to titrate upward.    Monitor mental status.   LOS (Days) 8 A FACE TO FACE EVALUATION WAS PERFORMED  ZMeredith Staggers7/31/2019, 9:17 AM

## 2018-02-04 NOTE — Progress Notes (Signed)
Physical Therapy Session Note  Patient Details  Name: Vicki Stein MRN: [id number] Date of Birth: 06/08/1928  Today's Date: 02/04/2018 PT Individual Time: 0930-1025 PT Individual Time Calculation (min): 55 min   Short Term Goals: Week 1:  PT Short Term Goal 1 (Week 1): =LTG due to ELOS  Skilled Therapeutic Interventions/Progress Updates:    Pt seated EOB with OT upon PT arrival, agreeable to therapy tx and denies pain. Pt's neighbor present for education and is planning to take pt home for d/c today. Pt performed sit<>stand with supervision and ambulated x 60 ft with RW and supervision, shortened step length with spastic LE movement. Pt transported to the gym and ascended/descended 6 steps (3 inch) with CGA and verbal cues for safety, use of B handrails going up and B hands on single handrails going down. Therapist educating neighbor on appropriate cues to provide. Pt has transportable ramp at home that is also an option. Pt performed car transfer with supervision, discussed possible need for help getting LEs into car secondary to tightness. Therapist educated pt on importance of follow up therapy and stretching at home in order to maintain mobility. Pt transported back to room in w/c, pt has scooter at home in which she performs power mobility independently. Pt left seated in w/c with needs in reach.   Therapy Documentation Precautions:  Precautions Precautions: Fall Restrictions Weight Bearing Restrictions: No   See Function Navigator for Current Functional Status.   Therapy/Group: Individual Therapy  Netta Corrigan, PT, DPT 02/04/2018, 7:57 AM

## 2018-02-05 ENCOUNTER — Emergency Department (HOSPITAL_COMMUNITY): Payer: Medicare Other

## 2018-02-05 ENCOUNTER — Inpatient Hospital Stay (HOSPITAL_COMMUNITY): Payer: Medicare Other

## 2018-02-05 ENCOUNTER — Other Ambulatory Visit: Payer: Self-pay

## 2018-02-05 ENCOUNTER — Encounter (HOSPITAL_COMMUNITY): Payer: Self-pay | Admitting: Emergency Medicine

## 2018-02-05 ENCOUNTER — Inpatient Hospital Stay (HOSPITAL_COMMUNITY)
Admission: EM | Admit: 2018-02-05 | Discharge: 2018-02-09 | DRG: 065 | Disposition: A | Discharge status: Hospice | Payer: Medicare Other | Attending: Internal Medicine | Admitting: Internal Medicine

## 2018-02-05 DIAGNOSIS — E785 Hyperlipidemia, unspecified: Secondary | ICD-10-CM | POA: Diagnosis present

## 2018-02-05 DIAGNOSIS — R29700 NIHSS score 0: Secondary | ICD-10-CM | POA: Diagnosis present

## 2018-02-05 DIAGNOSIS — D6869 Other thrombophilia: Secondary | ICD-10-CM | POA: Diagnosis not present

## 2018-02-05 DIAGNOSIS — M109 Gout, unspecified: Secondary | ICD-10-CM | POA: Diagnosis present

## 2018-02-05 DIAGNOSIS — K219 Gastro-esophageal reflux disease without esophagitis: Secondary | ICD-10-CM | POA: Diagnosis present

## 2018-02-05 DIAGNOSIS — G9340 Encephalopathy, unspecified: Secondary | ICD-10-CM | POA: Diagnosis not present

## 2018-02-05 DIAGNOSIS — I2699 Other pulmonary embolism without acute cor pulmonale: Secondary | ICD-10-CM | POA: Diagnosis present

## 2018-02-05 DIAGNOSIS — C541 Malignant neoplasm of endometrium: Secondary | ICD-10-CM | POA: Diagnosis present

## 2018-02-05 DIAGNOSIS — R58 Hemorrhage, not elsewhere classified: Secondary | ICD-10-CM | POA: Diagnosis not present

## 2018-02-05 DIAGNOSIS — I639 Cerebral infarction, unspecified: Secondary | ICD-10-CM

## 2018-02-05 DIAGNOSIS — Z7901 Long term (current) use of anticoagulants: Secondary | ICD-10-CM

## 2018-02-05 DIAGNOSIS — Z886 Allergy status to analgesic agent status: Secondary | ICD-10-CM | POA: Diagnosis not present

## 2018-02-05 DIAGNOSIS — G801 Spastic diplegic cerebral palsy: Secondary | ICD-10-CM | POA: Diagnosis present

## 2018-02-05 DIAGNOSIS — I672 Cerebral atherosclerosis: Secondary | ICD-10-CM | POA: Diagnosis not present

## 2018-02-05 DIAGNOSIS — Z86711 Personal history of pulmonary embolism: Secondary | ICD-10-CM | POA: Diagnosis not present

## 2018-02-05 DIAGNOSIS — G9349 Other encephalopathy: Secondary | ICD-10-CM | POA: Diagnosis present

## 2018-02-05 DIAGNOSIS — G114 Hereditary spastic paraplegia: Secondary | ICD-10-CM

## 2018-02-05 DIAGNOSIS — Z79899 Other long term (current) drug therapy: Secondary | ICD-10-CM

## 2018-02-05 DIAGNOSIS — Z8673 Personal history of transient ischemic attack (TIA), and cerebral infarction without residual deficits: Secondary | ICD-10-CM | POA: Diagnosis not present

## 2018-02-05 DIAGNOSIS — Z885 Allergy status to narcotic agent status: Secondary | ICD-10-CM

## 2018-02-05 DIAGNOSIS — R748 Abnormal levels of other serum enzymes: Secondary | ICD-10-CM | POA: Diagnosis not present

## 2018-02-05 DIAGNOSIS — I2510 Atherosclerotic heart disease of native coronary artery without angina pectoris: Secondary | ICD-10-CM | POA: Diagnosis present

## 2018-02-05 DIAGNOSIS — I10 Essential (primary) hypertension: Secondary | ICD-10-CM | POA: Diagnosis present

## 2018-02-05 DIAGNOSIS — N939 Abnormal uterine and vaginal bleeding, unspecified: Secondary | ICD-10-CM | POA: Diagnosis present

## 2018-02-05 DIAGNOSIS — Z881 Allergy status to other antibiotic agents status: Secondary | ICD-10-CM | POA: Diagnosis not present

## 2018-02-05 DIAGNOSIS — I739 Peripheral vascular disease, unspecified: Secondary | ICD-10-CM | POA: Diagnosis present

## 2018-02-05 DIAGNOSIS — H9190 Unspecified hearing loss, unspecified ear: Secondary | ICD-10-CM | POA: Diagnosis present

## 2018-02-05 DIAGNOSIS — I6389 Other cerebral infarction: Secondary | ICD-10-CM | POA: Diagnosis not present

## 2018-02-05 DIAGNOSIS — R7303 Prediabetes: Secondary | ICD-10-CM | POA: Diagnosis present

## 2018-02-05 DIAGNOSIS — I679 Cerebrovascular disease, unspecified: Secondary | ICD-10-CM | POA: Diagnosis not present

## 2018-02-05 DIAGNOSIS — Z66 Do not resuscitate: Secondary | ICD-10-CM | POA: Diagnosis present

## 2018-02-05 DIAGNOSIS — R404 Transient alteration of awareness: Secondary | ICD-10-CM | POA: Diagnosis not present

## 2018-02-05 DIAGNOSIS — G459 Transient cerebral ischemic attack, unspecified: Secondary | ICD-10-CM | POA: Diagnosis not present

## 2018-02-05 DIAGNOSIS — R531 Weakness: Secondary | ICD-10-CM | POA: Diagnosis not present

## 2018-02-05 DIAGNOSIS — Z7189 Other specified counseling: Secondary | ICD-10-CM | POA: Diagnosis not present

## 2018-02-05 DIAGNOSIS — R7989 Other specified abnormal findings of blood chemistry: Secondary | ICD-10-CM | POA: Diagnosis present

## 2018-02-05 DIAGNOSIS — I959 Hypotension, unspecified: Secondary | ICD-10-CM | POA: Diagnosis not present

## 2018-02-05 DIAGNOSIS — Z515 Encounter for palliative care: Secondary | ICD-10-CM | POA: Diagnosis present

## 2018-02-05 DIAGNOSIS — R4182 Altered mental status, unspecified: Secondary | ICD-10-CM | POA: Diagnosis not present

## 2018-02-05 DIAGNOSIS — I4891 Unspecified atrial fibrillation: Secondary | ICD-10-CM | POA: Diagnosis not present

## 2018-02-05 DIAGNOSIS — R778 Other specified abnormalities of plasma proteins: Secondary | ICD-10-CM

## 2018-02-05 LAB — I-STAT TROPONIN, ED: Troponin i, poc: 1.42 ng/mL (ref 0.00–0.08)

## 2018-02-05 LAB — CBC
HCT: 27 % — ABNORMAL LOW (ref 36.0–46.0)
HEMATOCRIT: 27 % — AB (ref 36.0–46.0)
HEMATOCRIT: 32 % — AB (ref 36.0–46.0)
HEMOGLOBIN: 10 g/dL — AB (ref 12.0–15.0)
HEMOGLOBIN: 8.7 g/dL — AB (ref 12.0–15.0)
Hemoglobin: 8.5 g/dL — ABNORMAL LOW (ref 12.0–15.0)
MCH: 30.2 pg (ref 26.0–34.0)
MCH: 29.7 pg (ref 26.0–34.0)
MCH: 30.1 pg (ref 26.0–34.0)
MCHC: 31.3 g/dL (ref 30.0–36.0)
MCHC: 31.5 g/dL (ref 30.0–36.0)
MCHC: 32.2 g/dL (ref 30.0–36.0)
MCV: 96.7 fL (ref 78.0–100.0)
MCV: 93.4 fL (ref 78.0–100.0)
MCV: 94.4 fL (ref 78.0–100.0)
PLATELETS: 174 10*3/uL (ref 150–400)
Platelets: 210 10*3/uL (ref 150–400)
Platelets: 180 10*3/uL (ref 150–400)
RBC: 2.86 MIL/uL — ABNORMAL LOW (ref 3.87–5.11)
RBC: 2.89 MIL/uL — ABNORMAL LOW (ref 3.87–5.11)
RBC: 3.31 MIL/uL — AB (ref 3.87–5.11)
RDW: 13.4 % (ref 11.5–15.5)
RDW: 13.5 % (ref 11.5–15.5)
RDW: 13.5 % (ref 11.5–15.5)
WBC: 11.2 10*3/uL — AB (ref 4.0–10.5)
WBC: 8.5 10*3/uL (ref 4.0–10.5)
WBC: 9.1 10*3/uL (ref 4.0–10.5)

## 2018-02-05 LAB — PROTIME-INR
INR: 1.81
INR: 1.9
Prothrombin Time: 20.8 seconds — ABNORMAL HIGH (ref 11.4–15.2)
Prothrombin Time: 21.6 seconds — ABNORMAL HIGH (ref 11.4–15.2)

## 2018-02-05 LAB — COMPREHENSIVE METABOLIC PANEL
ALBUMIN: 2.9 g/dL — AB (ref 3.5–5.0)
ALK PHOS: 97 U/L (ref 38–126)
ALT: 21 U/L (ref 0–44)
ALT: 19 U/L (ref 0–44)
ANION GAP: 12 (ref 5–15)
ANION GAP: 8 (ref 5–15)
AST: 30 U/L (ref 15–41)
AST: 28 U/L (ref 15–41)
Albumin: 3.3 g/dL — ABNORMAL LOW (ref 3.5–5.0)
Alkaline Phosphatase: 113 U/L (ref 38–126)
BILIRUBIN TOTAL: 0.5 mg/dL (ref 0.3–1.2)
BILIRUBIN TOTAL: 0.6 mg/dL (ref 0.3–1.2)
BUN: 38 mg/dL — ABNORMAL HIGH (ref 8–23)
BUN: 33 mg/dL — ABNORMAL HIGH (ref 8–23)
CALCIUM: 8.4 mg/dL — AB (ref 8.9–10.3)
CO2: 14 mmol/L — ABNORMAL LOW (ref 22–32)
CO2: 14 mmol/L — ABNORMAL LOW (ref 22–32)
Calcium: 8.8 mg/dL — ABNORMAL LOW (ref 8.9–10.3)
Chloride: 114 mmol/L — ABNORMAL HIGH (ref 98–111)
Chloride: 117 mmol/L — ABNORMAL HIGH (ref 98–111)
Creatinine, Ser: 1.63 mg/dL — ABNORMAL HIGH (ref 0.44–1.00)
Creatinine, Ser: 1.27 mg/dL — ABNORMAL HIGH (ref 0.44–1.00)
GFR, EST AFRICAN AMERICAN: 31 mL/min — AB (ref >60)
GFR, EST AFRICAN AMERICAN: 42 mL/min — AB (ref >60)
GFR, EST NON AFRICAN AMERICAN: 27 mL/min — AB (ref >60)
GFR, EST NON AFRICAN AMERICAN: 37 mL/min — AB (ref >60)
GLUCOSE: 115 mg/dL — AB (ref 70–99)
Glucose, Bld: 155 mg/dL — ABNORMAL HIGH (ref 70–99)
POTASSIUM: 4.4 mmol/L (ref 3.5–5.1)
POTASSIUM: 4.8 mmol/L (ref 3.5–5.1)
Sodium: 140 mmol/L (ref 135–145)
Sodium: 139 mmol/L (ref 135–145)
TOTAL PROTEIN: 6.2 g/dL — AB (ref 6.5–8.1)
Total Protein: 5.4 g/dL — ABNORMAL LOW (ref 6.5–8.1)

## 2018-02-05 LAB — I-STAT CHEM 8, ED
BUN: 40 mg/dL — AB (ref 8–23)
CHLORIDE: 111 mmol/L (ref 98–111)
Calcium, Ion: 1.18 mmol/L (ref 1.15–1.40)
Creatinine, Ser: 1.6 mg/dL — ABNORMAL HIGH (ref 0.44–1.00)
Glucose, Bld: 151 mg/dL — ABNORMAL HIGH (ref 70–99)
HEMATOCRIT: 29 % — AB (ref 36.0–46.0)
Hemoglobin: 9.9 g/dL — ABNORMAL LOW (ref 12.0–15.0)
Potassium: 4.7 mmol/L (ref 3.5–5.1)
Sodium: 138 mmol/L (ref 135–145)
TCO2: 17 mmol/L — AB (ref 22–32)

## 2018-02-05 LAB — DIFFERENTIAL
Abs Immature Granulocytes: 0.1 10*3/uL (ref 0.0–0.1)
Basophils Absolute: 0.1 10*3/uL (ref 0.0–0.1)
Basophils Relative: 1 %
Eosinophils Absolute: 0.1 10*3/uL (ref 0.0–0.7)
Eosinophils Relative: 1 %
Immature Granulocytes: 1 %
LYMPHS PCT: 8 %
Lymphs Abs: 0.8 10*3/uL (ref 0.7–4.0)
MONO ABS: 1.5 10*3/uL — AB (ref 0.1–1.0)
Monocytes Relative: 13 %
Neutro Abs: 8.7 10*3/uL — ABNORMAL HIGH (ref 1.7–7.7)
Neutrophils Relative %: 78 %

## 2018-02-05 LAB — TROPONIN I
TROPONIN I: 1.65 ng/mL — AB (ref <0.03)
TROPONIN I: 1.58 ng/mL — AB (ref <0.03)
TROPONIN I: 1.38 ng/mL — AB (ref <0.03)

## 2018-02-05 LAB — ETHANOL: Alcohol, Ethyl (B): <10 mg/dL (ref <10)

## 2018-02-05 LAB — TYPE AND SCREEN
ABO/RH(D): O POS
ANTIBODY SCREEN: NEGATIVE

## 2018-02-05 LAB — APTT: aPTT: 31 seconds (ref 24–36)

## 2018-02-05 LAB — ABO/RH: ABO/RH(D): O POS

## 2018-02-05 LAB — GLUCOSE, CAPILLARY: Glucose-Capillary: 100 mg/dL — ABNORMAL HIGH (ref 70–99)

## 2018-02-05 LAB — CBG MONITORING, ED: Glucose-Capillary: 134 mg/dL — ABNORMAL HIGH (ref 70–99)

## 2018-02-05 MED ORDER — LORAZEPAM 2 MG/ML PO CONC
1.0000 mg | ORAL | Status: DC | PRN
  Filled 2018-02-05: qty 0.5

## 2018-02-05 MED ORDER — ACETAMINOPHEN 325 MG PO TABS
650.0000 mg | ORAL_TABLET | ORAL | Status: DC | PRN
  Administered 2018-02-07: 650 mg via ORAL
  Filled 2018-02-05 (×2): qty 2

## 2018-02-05 MED ORDER — STROKE: EARLY STAGES OF RECOVERY BOOK
Freq: Once | Status: DC
  Filled 2018-02-05: qty 1

## 2018-02-05 MED ORDER — ACETAMINOPHEN 650 MG RE SUPP: 650.0000 mg | RECTAL | Status: DC | PRN

## 2018-02-05 MED ORDER — HALOPERIDOL 2 MG PO TABS
2.0000 mg | ORAL_TABLET | Freq: Four times a day (QID) | ORAL | Status: DC | PRN
  Filled 2018-02-05: qty 1

## 2018-02-05 MED ORDER — SODIUM CHLORIDE 0.9 % IV SOLN
INTRAVENOUS | Status: AC
  Administered 2018-02-05: 06:00:00 via INTRAVENOUS
  Administered 2018-02-05: 75 mL/h via INTRAVENOUS
  Administered 2018-02-05: 22:00:00 via INTRAVENOUS

## 2018-02-05 MED ORDER — HYDRALAZINE HCL 20 MG/ML IJ SOLN: 10.0000 mg | INTRAMUSCULAR | Status: DC | PRN

## 2018-02-05 MED ORDER — SODIUM CHLORIDE 0.9% FLUSH
3.0000 mL | Freq: Two times a day (BID) | INTRAVENOUS | Status: DC
  Administered 2018-02-06 – 2018-02-08 (×4): 3 mL via INTRAVENOUS

## 2018-02-05 MED ORDER — LORAZEPAM 0.5 MG PO TABS
0.2500 mg | ORAL_TABLET | Freq: Two times a day (BID) | ORAL | Status: DC
  Administered 2018-02-05 – 2018-02-09 (×6): 0.25 mg via ORAL
  Filled 2018-02-05 (×7): qty 1

## 2018-02-05 MED ORDER — BACLOFEN 5 MG HALF TABLET
5.0000 mg | ORAL_TABLET | Freq: Three times a day (TID) | ORAL | Status: DC
  Administered 2018-02-06 – 2018-02-09 (×5): 5 mg via ORAL
  Filled 2018-02-05 (×15): qty 1

## 2018-02-05 MED ORDER — SODIUM CHLORIDE 0.9 % IV BOLUS
1000.0000 mL | Freq: Once | INTRAVENOUS | Status: AC
Start: 2018-02-05 — End: 2018-02-05
  Administered 2018-02-05: 1000 mL via INTRAVENOUS

## 2018-02-05 MED ORDER — HALOPERIDOL LACTATE 2 MG/ML PO CONC
2.0000 mg | Freq: Four times a day (QID) | ORAL | Status: DC | PRN
  Filled 2018-02-05: qty 1

## 2018-02-05 MED ORDER — LORAZEPAM 2 MG/ML IJ SOLN
1.0000 mg | INTRAMUSCULAR | Status: DC | PRN
  Administered 2018-02-08: 1 mg via INTRAVENOUS
  Filled 2018-02-05: qty 1

## 2018-02-05 MED ORDER — HALOPERIDOL LACTATE 5 MG/ML IJ SOLN: 2.0000 mg | Freq: Four times a day (QID) | INTRAMUSCULAR | Status: DC | PRN

## 2018-02-05 MED ORDER — PANTOPRAZOLE SODIUM 40 MG PO TBEC
40.0000 mg | DELAYED_RELEASE_TABLET | Freq: Every day | ORAL | Status: DC
  Administered 2018-02-07: 40 mg via ORAL
  Filled 2018-02-05 (×3): qty 1

## 2018-02-05 MED ORDER — LORAZEPAM 1 MG PO TABS
1.0000 mg | ORAL_TABLET | ORAL | Status: DC | PRN
  Administered 2018-02-07: 1 mg via ORAL
  Filled 2018-02-05: qty 1

## 2018-02-05 MED ORDER — ACETAMINOPHEN 160 MG/5ML PO SOLN: 650.0000 mg | ORAL | Status: DC | PRN

## 2018-02-05 MED ORDER — SODIUM CHLORIDE 0.9% FLUSH: 3.0000 mL | INTRAVENOUS | Status: DC | PRN

## 2018-02-05 MED ORDER — MECLIZINE HCL 25 MG PO TABS: 25.0000 mg | ORAL_TABLET | Freq: Two times a day (BID) | ORAL | Status: DC | PRN

## 2018-02-05 MED ORDER — SODIUM CHLORIDE 0.9 % IV SOLN: 250.0000 mL | INTRAVENOUS | Status: DC | PRN

## 2018-02-05 NOTE — Plan of Care (Signed)
°  Problem: Coping: °Goal: Level of anxiety will decrease °Outcome: Progressing °  °

## 2018-02-05 NOTE — Consult Note (Signed)
Consultation Note Date: 02/05/2018   Patient Name: Vicki Stein  DOB: Nov 16, 1927  MRN: [id number]  Age / Sex: 84 y.o., female  PCP: Glenford Bayley, DO Referring Physician: Hosie Poisson, MD  Reason for Consultation: Establishing goals of care and Psychosocial/spiritual support  HPI/Patient Profile: 84 y.o. female  with past medical history of spastic paraplegia (childhood disease), cerebral vascular disease, PVD, back pain, and grade 2 endometrial cancer on Megace (dx in 06/2017) who was admitted on 02/05/2018 with altered mental status. Patient had a recent admission (discharged 7/23) for PE and CVA.  She was discharged on anticoagulation.  She completed CIR on 7/31 and was discharged to her home.  She returned to the ER the evening of 7/31 with altered mental status.  Imaging shows additional CVAs.  She is also having vaginal bleeding.  Speech therapy evaluation indicated mild aspiration risk.  Patient can have a regular diet. Physical therapy evaluation indicated patient needed max assist to sit edge of bed and was unable to stand.  Clinical Assessment and Goals of Care:  I have reviewed medical records including EPIC notes, labs and imaging, received report from the care team, assessed the patient and then met at the bedside along with her son Vicki Stein to discuss diagnosis prognosis, Katherine, EOL wishes, disposition and options.  I introduced Palliative Medicine as specialized medical care for people living with serious illness. It focuses on providing relief from the symptoms and stress of a serious illness. The goal is to improve quality of life for both the patient and the family.  We discussed a brief life review of the patient. She has two sons Vicki Stein and Vicki Stein).  Her husband died 16 years ago and he has lived independently ever since.  She used to earn an income making fishing lures.  Per Vicki Stein, her niece is a source  of support.  [name] and his wife are the primary care takers (from a distance - they live in [place], [place]).  Her other son, Vicki Stein lives in [place], but is a hermit and rarely sees his mother.  As far as functional and nutritional status Vicki Stein is virtually deaf.  Per Vicki Stein, she is a very sweet person.  She walks with an abnormal gait due to 1 leg being longer than the other.  At home she has power wheel chairs and rolling walkers, but in her 45 foot home she "furniture walks".  During her 1/2 day at home after discharge from CIR she slept most of the time.  When she was awake she was defiant and angry - uncooperative with Vicki Stein and his wife.  This was very out of character.  Per Vicki Stein his mother has "given up"  And she is "ready to go".  He cries and says "She blames me for this".  We discussed the dilemma of the need for anticoagulation to help prevent further strokes VS vaginal bleeding from the endometrial cancer.  I suggested to Vicki Stein that the best care for his mother at this point may be supportive  care.  I explained that we would treat her symptoms and keep her as comfortable as possible but not do things to prolong her life.   [name] wants his mother to be comfortable.  He does not believe she is happy at this point.  We discussed a hospice philosophy with Vicki Stein was in agreement with pursuing.  I attempted to elicit values and goals of care important to the patient.  Shaquasia was able to tell me repeatedly - "I want to go home.  I want to be in my own home alone".  She was not able to tell me what has happened to her or why she is here.  This was very difficult for Vicki Stein to witness.  Hospice and Palliative Care services outpatient were explained and offered.  Questions and concerns were addressed. The family was encouraged to call with questions or concerns.    Primary Decision Maker:  NEXT OF KIN son Vicki Stein    SUMMARY OF RECOMMENDATIONS     Anticoagulation has been discontinued.  (Son is in agreement with  this)  Would recommend discontinuation of Megace as well as it may contribute to a hypercoagulable state.  Son Vicki Stein [facility]) is in agreement with supportive care focused on her comfort.    Discontinue interventions that will not contribute to her comfort (tele, pulse ox, minimize medications)  Patient is nearly deaf but does not admit to not being able to hear you in a conversation.  Speak loudly.  Patient wants to go home - unfortunately her sons are unable to care for her at home.  Dispo will be a challenge  She does not appear [facility] eligible today.     She is eligible for hospice services at home or in a nursing facility.  Will add low dose benzodiazepines as the patient is anxious.  Will add back PTA baclofen  Discussed with Dr. Karleen Hampshire - will proceed with PT/OT/SLP and then determine disposition.   Code Status/Advance Care Planning:  DNR   Additional Recommendations (Limitations, Scope, Preferences):  Full Comfort Care  Palliative Prophylaxis:   Delirium Protocol  Prognosis:  Difficult to determine.  If bleeding stops and she does not have another acute event she could survive for months.  Unfortunately she is at a high likelihood to have additional strokes (hypercoagulable state) and to continue to bleed (endometrial cancer).  Despite her relatively hardy appearance I believe it is likely she has weeks to months.    Discharge Planning: To Be Determined  Needs 24 hour care.  Hospice services at home vs SNF with Palliative.      Primary Diagnoses: Present on Admission: . Acute encephalopathy . Pulmonary embolism ([place]) . Vaginal bleeding, abnormal . 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 Benign essential HTN   I have reviewed the medical record, interviewed the patient and family, and examined the patient. The following aspects are pertinent.  Past Medical History:  Diagnosis Date  . Arthritis    all over  . Back pain   . Blood dyscrasia    on  blood thiners  . Borderline diabetes   . Cerebrovascular disease   . Coronary artery disease    a. LHC (9/14):  pLM 30, oD1 50, D2 40-50, pRI 30, mCFX 30, mRCA 30, EF 65%  . GERD (gastroesophageal reflux disease)   . Gout   . Hemorrhoids   . High cholesterol   . History of kidney stones    60 years ago  . Hyperlipemia   . Hypertension   .  Migraines   . Movement disorder   . 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   Marland Kitchen Peripheral vascular disease ([facility])    poor circulation lower legs  . Pneumonia    30 years ago  . Pre-diabetes    borderline  . Reflux   . Stroke [facility])    " 5 mini strokes"   Social History   Socioeconomic History  . Marital status: Widowed    Spouse name: Not on file  . Number of children: 3  . Years of education: 10th  . Highest education level: Not on file  Occupational History  . Occupation: retired  Scientific laboratory technician  . Financial resource strain: Not on file  . Food insecurity:    Worry: Not on file    Inability: Not on file  . Transportation needs:    Medical: Not on file    Non-medical: Not on file  Tobacco Use  . Smoking status: Never Smoker  . Smokeless tobacco: Never Used  Substance and Sexual Activity  . Alcohol use: No  . Drug use: No  . Sexual activity: Not Currently  Lifestyle  . Physical activity:    Days per week: Not on file    Minutes per session: Not on file  . Stress: Not on file  Relationships  . Social connections:    Talks on phone: Not on file    Gets together: Not on file    Attends religious service: Not on file    Active member of club or organization: Not on file    Attends meetings of clubs or organizations: Not on file    Relationship status: Not on file  Other Topics Concern  . Not on file  Social History Narrative   Patient lives alone, is a widow. Patient is retired. Patient has 10th grade education. Right handed.   Family History  Problem Relation Age of Onset  . Heart Problems Mother   .  Heart Problems Father   . Heart attack Son    Scheduled Meds: .  stroke: mapping our early stages of recovery book   Does not apply Once  . Baclofen  5 mg Oral TID  . LORazepam  0.25 mg Oral BID  . pantoprazole  40 mg Oral Daily  . sodium chloride flush  3 mL Intravenous Q12H   Continuous Infusions: . sodium chloride 75 mL/hr (02/05/18 1106)  . sodium chloride     PRN Meds:.sodium chloride, acetaminophen **OR** acetaminophen (TYLENOL) oral liquid 160 mg/5 mL **OR** acetaminophen, haloperidol **OR** haloperidol **OR** haloperidol lactate, hydrALAZINE, LORazepam **OR** LORazepam **OR** LORazepam, meclizine, sodium chloride flush Allergies  Allergen Reactions  . Aleve [Naproxen Sodium] Itching and Swelling  . Bactrim [Sulfamethoxazole-Trimethoprim] Itching and Other (See Comments)    Had a bad reaction, can't remember, worked me over  . Motrin [Ibuprofen] Itching and Swelling  . Penicillins Hives, Itching and Other (See Comments)    Has patient had a PCN reaction causing immediate rash, facial/tongue/throat swelling, SOB or lightheadedness with hypotension: No Has patient had a PCN reaction causing severe rash involving mucus membranes or skin necrosis: No Has patient had a PCN reaction that required hospitalization No Has patient had a PCN reaction occurring within the last 10 years: No If all of the above answers are "NO", then may proceed with Cephalosporin use.  Marland Kitchen Ultram [Tramadol] Other (See Comments)    Itvhing and tongue swelling.   Review of Systems patient doesn't answer my questions rather she just  tells me she wants to go home.  Physical Exam  Well developed female, awake, alert, sitting in chair.  Very hard of hearing. CV rrr resp no distress Ext unable to move her right hand, lower ext no edema.  Vital Signs: BP (!) 108/49 (BP Location: Left Arm)   Pulse 70   Temp 97.9 F (36.6 C) (Oral)   Resp 18   Ht _0  (1.575 m)   Wt 63.9 kg (140 lb 14 oz)   SpO2 98%    BMI 25.77 kg/m  Pain Scale: PAINAD       SpO2: SpO2: 98 % O2 Device:SpO2: 98 % O2 Flow Rate: .   IO: Intake/output summary:   Intake/Output Summary (Last 24 hours) at 02/05/2018 1127 Last data filed at 02/05/2018 0415 Gross per 24 hour  Intake 1000 ml  Output -  Net 1000 ml    LBM: Last BM Date: 02/03/18 Baseline Weight: Weight: 62.1 kg (137 lb) Most recent weight: Weight: 63.9 kg (140 lb 14 oz)     Palliative Assessment/Data:40%     Time In: 9:00 Time Out: 11:00 Time Total: 120 min. Greater than 50%  of this time was spent counseling and coordinating care related to the above assessment and plan.  Signed by: Florentina Jenny, PA-C Palliative Medicine Pager: [phone]  Please contact Palliative Medicine Team phone at [phone] for questions and concerns.  For individual provider: See Shea Evans

## 2018-02-05 NOTE — Evaluation (Signed)
Clinical/Bedside Swallow Evaluation Patient Details  Name: Vicki Stein MRN: [id number] Date of Birth: 08-02-1927  Today's Date: 02/05/2018 Time: SLP Start Time (ACUTE ONLY): 3 SLP Stop Time (ACUTE ONLY): 0930 SLP Time Calculation (min) (ACUTE ONLY): 15 min  Past Medical History:  Past Medical History:  Diagnosis Date  . Arthritis    all over  . Back pain   . Blood dyscrasia    on blood thiners  . Borderline diabetes   . Cerebrovascular disease   . Coronary artery disease    a. LHC (9/14):  pLM 30, oD1 50, D2 40-50, pRI 30, mCFX 30, mRCA 30, EF 65%  . GERD (gastroesophageal reflux disease)   . Gout   . Hemorrhoids   . High cholesterol   . History of kidney stones    60 years ago  . Hyperlipemia   . Hypertension   . Migraines   . Movement disorder   . 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   Marland Kitchen Peripheral vascular disease (HCC)    poor circulation lower legs  . Pneumonia    30 years ago  . Pre-diabetes    borderline  . Reflux   . Stroke [facility])    " 5 mini strokes"   Past Surgical History:  Past Surgical History:  Procedure Laterality Date  . APPENDECTOMY    . CATARACT EXTRACTION W/ INTRAOCULAR LENS  IMPLANT, BILATERAL    . CHOLECYSTECTOMY    . COLONOSCOPY    . DILATATION & CURETTAGE/HYSTEROSCOPY WITH MYOSURE N/A 06/25/2017   Procedure: DILATATION & CURETTAGE/HYSTEROSCOPY;  Surgeon: Janyth Pupa, DO;  Location: [facility];  Service: Gynecology;  Laterality: N/A;  . DILATION AND CURETTAGE OF UTERUS    . ESOPHAGOGASTRODUODENOSCOPY N/A 11/14/2015   Procedure: ESOPHAGOGASTRODUODENOSCOPY (EGD);  Surgeon: Laurence Spates, MD;  Location: [facility] ENDOSCOPY;  Service: Endoscopy;  Laterality: N/A;  . HEMORRHOID SURGERY    . LEFT HEART CATHETERIZATION WITH CORONARY ANGIOGRAM N/A 03/25/2013   Procedure: LEFT HEART CATHETERIZATION WITH CORONARY ANGIOGRAM;  Surgeon: Burnell Blanks, MD;  Location: [facility] CATH LAB;  Service: Cardiovascular;  Laterality: N/A;  . ROTATOR  CUFF REPAIR    . TONSILLECTOMY    . TUBAL LIGATION     HPI:  Vicki Stein is a 84 y.o. female with history of recent admission for embolic stroke and pulmonary embolism on apixaban, endometrial carcinoma on the Megace recently IUD was removed.  Patient was eventually discharged to CIR and was discharged home yesterday following which patient was found to be more confused and not herself after reaching home.  Patient has known history of spastic paraparesis.   Assessment / Plan / Recommendation Clinical Impression   Pt presents with grossly intact oropharyngeal swallowing function with no overt s/s of aspiration with solids or liquids.  Pt's swallow response was swift and pt's oral phase was efficient for containing, transiting, and clearing boluses from the oral cavity.  Recommend that pt resume a regular diet and thin liquids.  No further ST needs indicated for dysphagia. Cognitive-linguistic evaluation was also completed on this date.  Please see report to follow for clinical findings.      Aspiration Risk  Mild aspiration risk    Diet Recommendation Regular;Thin liquid   Liquid Administration via: Straw;Cup Medication Administration: Whole meds with liquid Supervision: Patient able to self feed;Full supervision/cueing for compensatory strategies Compensations: Minimize environmental distractions;Slow rate;Small sips/bites Postural Changes: Seated upright at 90 degrees;Remain upright for at least 30 minutes after po intake  Other  Recommendations     Follow up Recommendations None        Swallow Study   General HPI: Vicki Stein is a 84 y.o. female with history of recent admission for embolic stroke and pulmonary embolism on apixaban, endometrial carcinoma on the Megace recently IUD was removed.  Patient was eventually discharged to CIR and was discharged home yesterday following which patient was found to be more confused and not herself after reaching home.  Patient has  known history of spastic paraparesis. Type of Study: Bedside Swallow Evaluation Previous Swallow Assessment: none on record(Esophagram in 2010 & 2017 with presbyesophagus, no stricture) Diet Prior to this Study: NPO Temperature Spikes Noted: No Respiratory Status: Room air History of Recent Intubation: No Behavior/Cognition: Alert;Confused;Uncooperative;Requires cueing Oral Cavity Assessment: Within Functional Limits Oral Cavity - Dentition: Adequate natural dentition Vision: Functional for self-feeding Patient Positioning: Upright in bed Baseline Vocal Quality: Normal Volitional Cough: Cognitively unable to elicit Volitional Swallow: Unable to elicit    Oral/Motor/Sensory Function Overall Oral Motor/Sensory Function: Within functional limits   Ice Chips     Thin Liquid Thin Liquid: Within functional limits    Nectar Thick     Honey Thick     Puree Puree: Within functional limits   Solid     Solid: Within functional limits      Salar Molden, Elmyra Ricks L 02/05/2018,9:53 AM

## 2018-02-05 NOTE — Evaluation (Signed)
Physical Therapy Evaluation Patient Details Name: Vicki Stein MRN: [id number] DOB: 1928/04/30 Today's Date: 02/05/2018   History of Present Illness  Vicki Stein is a 84 y.o. female with history of recent admission for embolic stroke and pulmonary embolism on apixaban, endometrial carcinoma on the Megace recently IUD was removed.  Patient was eventually discharged to CIR and was discharged home yesterday following which patient was found to be more confused and not herself after reaching home.  Patient has known history of spastic paraparesis. PMH including CAD, GERD, HTN, paraplegia (spastic congenital), PVD, and "5 mini strokes".    Clinical Impression  Pt admitted with above diagnosis. Pt currently with functional limitations due to the deficits listed below (see PT Problem List). Pt was able to scoot to chair but needed max assist due to posterior lean and poor use of LEs.  Feel that pt will need further rehab unless she has 24 hour care.   Pt will benefit from skilled PT to increase their independence and safety with mobility to allow discharge to the venue listed below.      Follow Up Recommendations SNF;Supervision/Assistance - 24 hour    Equipment Recommendations  Other (comment)(TBA)    Recommendations for Other Services       Precautions / Restrictions Precautions Precautions: Fall Restrictions Weight Bearing Restrictions: No      Mobility  Bed Mobility Overal bed mobility: Needs Assistance Bed Mobility: Supine to Sit     Supine to sit: +2 for physical assistance;Max assist     General bed mobility comments: Pt needed max assist to sit EOB.  Signficant posterior lean once EOB with LEs extended.   Transfers Overall transfer level: Needs assistance   Transfers: Sit to/from Stand;Lateral/Scoot Transfers Sit to Stand: Total assist;From elevated surface        Lateral/Scoot Transfers: Max assist General transfer comment: Pt could not stand.  Pt needed mod  assist just to sit EOB.  Pt needed max assist to scoot to recliner from bed with arm on recliner dropped.   Ambulation/Gait                Stairs            Wheelchair Mobility    Modified Rankin (Stroke Patients Only) Modified Rankin (Stroke Patients Only) Pre-Morbid Rankin Score: Slight disability Modified Rankin: Severe disability     Balance Overall balance assessment: Needs assistance Sitting-balance support: Bilateral upper extremity supported;Feet supported Sitting balance-Leahy Scale: Zero Sitting balance - Comments: could not sit up without max assist with posterior lean significant.  Postural control: Posterior lean Standing balance support: Bilateral upper extremity supported;During functional activity Standing balance-Leahy Scale: Zero Standing balance comment: unable to stand                              Pertinent Vitals/Pain Pain Assessment: No/denies pain    Home Living Family/patient expects to be discharged to:: Unsure Living Arrangements: Alone Available Help at Discharge: Family;Available PRN/intermittently;Neighbor Type of Home: House Home Access: Stairs to enter Entrance Stairs-Rails: Left Entrance Stairs-Number of Steps: 4 Home Layout: One level Home Equipment: [place] - 2 wheels;Shower seat;Adaptive equipment;Bedside commode(3 wheeled walker) Additional Comments: Pt recently discharged from a CIR stay and went home with son.  All info above from prior stay as pt confused and coul dnot answer any questions about home.      Prior Function Level of Independence: Independent with assistive device(s)  Comments: Pt performs all ADLs and IADLs in home. Uses AE for LB dressing. uses RW all the time; neighbor does grocery shopping and driving.  All of above per previous chart.  Unsure as pt confused and no family present.      Hand Dominance   Dominant Hand: Right    Extremity/Trunk Assessment   Upper Extremity  Assessment Upper Extremity Assessment: Defer to OT evaluation    Lower Extremity Assessment Lower Extremity Assessment: RLE deficits/detail;LLE deficits/detail RLE Deficits / Details: difficult to bend LE into knee flexion - only able to bend to 90 degrees with assist therefore strength grossly 2+/5, stiff/spastic LE even with mobility LLE Deficits / Details: LLE did not flex past 70 degrees, strength difficult to test due to poor ROM and spastic/stiffness    Cervical / Trunk Assessment Cervical / Trunk Assessment: Kyphotic  Communication   Communication: HOH  Cognition Arousal/Alertness: Awake/alert Behavior During Therapy: Impulsive;Anxious Overall Cognitive Status: Impaired/Different from baseline Area of Impairment: Attention;Memory;Following commands;Safety/judgement;Awareness;Orientation                 Orientation Level: Place;Time;Situation(Pt thinks she is at home) Current Attention Level: Focused Memory: Decreased recall of precautions Following Commands: Follows one step commands inconsistently;Follows one step commands with increased time Safety/Judgement: Decreased awareness of safety;Decreased awareness of deficits     General Comments: Pt tangential with all activities. She is hard of hearing impacting her ability to follow directions at times. Poor safety awareness noted and decreased awareness in general.       General Comments      Exercises     Assessment/Plan    PT Assessment Patient needs continued PT services  PT Problem List Decreased strength;Decreased range of motion;Decreased activity tolerance;Decreased balance;Decreased mobility;Decreased coordination;Decreased knowledge of use of DME;Decreased safety awareness       PT Treatment Interventions DME instruction;Gait training;Functional mobility training;Stair training;Therapeutic activities;Therapeutic exercise;Balance training;Neuromuscular re-education;Patient/family education    PT Goals  (Current goals can be found in the Care Plan section)  Acute Rehab PT Goals Patient Stated Goal: unable to state PT Goal Formulation: Patient unable to participate in goal setting Time For Goal Achievement: 02/19/18 Potential to Achieve Goals: Good    Frequency Min 2X/week   Barriers to discharge Decreased caregiver support      Co-evaluation               AM-PAC PT "6 Clicks" Daily Activity  Outcome Measure Difficulty turning over in bed (including adjusting bedclothes, sheets and blankets)?: Unable Difficulty moving from lying on back to sitting on the side of the bed? : Unable Difficulty sitting down on and standing up from a chair with arms (e.g., wheelchair, bedside commode, etc,.)?: Unable Help needed moving to and from a bed to chair (including a wheelchair)?: Total Help needed walking in hospital room?: Total Help needed climbing 3-5 steps with a railing? : Total 6 Click Score: 6    End of Session Equipment Utilized During Treatment: Gait belt Activity Tolerance: Patient limited by fatigue Patient left: in chair;with call bell/phone within reach;with chair alarm set Nurse Communication: Mobility status PT Visit Diagnosis: Unsteadiness on feet (R26.81);Other abnormalities of gait and mobility (R26.89);Muscle weakness (generalized) (M62.81)    Time: 6256-3893 PT Time Calculation (min) (ACUTE ONLY): 14 min   Charges:   PT Evaluation $PT Eval Moderate Complexity: [place] [phone] (pager)   Denice Paradise 02/05/2018, 11:12 AM

## 2018-02-05 NOTE — ED Triage Notes (Signed)
Via [facility], pt arrives from home with c/o of AMS. Last seen well @ 11am. Per EMS pt without weakness to any extremities. Reports dysphasia with word salad. 2.5 versed given in route. Pt recently discharged for multiple TIAs.

## 2018-02-05 NOTE — H&P (Signed)
History and Physical    Vicki Stein ALP:[id number] DOB: 03-24-1928 DOA: 02/05/2018  PCP: Glenford Bayley, DO  Patient coming from: Home.  Chief Complaint: Confusion.  Change in mental status.  History obtained from patient's son.  HPI: Vicki Stein is a 84 y.o. female with history of recent admission for embolic stroke and pulmonary embolism on apixaban, endometrial carcinoma on the Megace recently IUD was removed.  Patient was eventually discharged to CIR and was discharged home yesterday following which patient was found to be more confused and not herself after reaching home.  Patient has known history of spastic paraparesis.  ED Course: In the ER patient appears disoriented and CT head shows ischemic changes larger than the previous one.  On-call neurologist was notified and at this time neurologist recommended MRI brain.  Troponin was also elevated and troponin was elevated during previous admission also.  In the ER patient also was noted to have increasing vaginal bleeding.  Patient admitted for further management and observation.  Review of Systems: As per HPI, rest all negative.   Past Medical History:  Diagnosis Date  . Arthritis    all over  . Back pain   . Blood dyscrasia    on blood thiners  . Borderline diabetes   . Cerebrovascular disease   . Coronary artery disease    a. LHC (9/14):  pLM 30, oD1 50, D2 40-50, pRI 30, mCFX 30, mRCA 30, EF 65%  . GERD (gastroesophageal reflux disease)   . Gout   . Hemorrhoids   . High cholesterol   . History of kidney stones    60 years ago  . Hyperlipemia   . Hypertension   . Migraines   . Movement disorder   . 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   Marland Kitchen Peripheral vascular disease (HCC)    poor circulation lower legs  . Pneumonia    30 years ago  . Pre-diabetes    borderline  . Reflux   . Stroke [facility])    " 5 mini strokes"    Past Surgical History:  Procedure Laterality Date  . APPENDECTOMY    .  CATARACT EXTRACTION W/ INTRAOCULAR LENS  IMPLANT, BILATERAL    . CHOLECYSTECTOMY    . COLONOSCOPY    . DILATATION & CURETTAGE/HYSTEROSCOPY WITH MYOSURE N/A 06/25/2017   Procedure: DILATATION & CURETTAGE/HYSTEROSCOPY;  Surgeon: Janyth Pupa, DO;  Location: [place] [facility];  Service: Gynecology;  Laterality: N/A;  . DILATION AND CURETTAGE OF UTERUS    . ESOPHAGOGASTRODUODENOSCOPY N/A 11/14/2015   Procedure: ESOPHAGOGASTRODUODENOSCOPY (EGD);  Surgeon: Laurence Spates, MD;  Location[facility] ENDOSCOPY;  Service: Endoscopy;  Laterality: N/A;  . HEMORRHOID SURGERY    . LEFT HEART CATHETERIZATION WITH CORONARY ANGIOGRAM N/A 03/25/2013   Procedure: LEFT HEART CATHETERIZATION WITH CORONARY ANGIOGRAM;  Surgeon: Burnell Blanks, MD;  Location: [facility] CATH LAB;  Service: Cardiovascular;  Laterality: N/A;  . ROTATOR CUFF REPAIR    . TONSILLECTOMY    . TUBAL LIGATION       reports that she has never smoked. She has never used smokeless tobacco. She reports that she does not drink alcohol or use drugs.  Allergies  Allergen Reactions  . Aleve [Naproxen Sodium] Itching and Swelling  . Bactrim [Sulfamethoxazole-Trimethoprim] Itching and Other (See Comments)    Had a bad reaction, can't remember, worked me over  . Motrin [Ibuprofen] Itching and Swelling  . Penicillins Hives, Itching and Other (See Comments)    Has  patient had a PCN reaction causing immediate rash, facial/tongue/throat swelling, SOB or lightheadedness with hypotension: No Has patient had a PCN reaction causing severe rash involving mucus membranes or skin necrosis: No Has patient had a PCN reaction that required hospitalization No Has patient had a PCN reaction occurring within the last 10 years: No If all of the above answers are "NO", then may proceed with Cephalosporin use.  Marland Kitchen Ultram [Tramadol] Other (See Comments)    Itvhing and tongue swelling.    Family History  Problem Relation Age of Onset  . Heart Problems Mother   . Heart Problems  Father   . Heart attack Son     Prior to Admission medications   Medication Sig Start Date End Date Taking? Authorizing Provider  acetaminophen (TYLENOL) 500 MG tablet Take 500 mg by mouth every 6 (six) hours as needed for mild pain or moderate pain.    [provider]  amLODipine (NORVASC) 2.5 MG tablet Take 1 tablet (2.5 mg total) by mouth daily. 02/04/18   Love, Ivan Anchors, PA-C  apixaban (ELIQUIS) 5 MG TABS tablet Take 1 tablet (5 mg total) by mouth 2 (two) times daily. 02/04/18   Love, Ivan Anchors, PA-C  atorvastatin (LIPITOR) 40 MG tablet Take 1 tablet (40 mg total) by mouth every evening. 02/04/18   Love, Ivan Anchors, PA-C  Baclofen 5 MG TABS Take 5 mg by mouth 3 (three) times daily. 02/04/18   Love, Ivan Anchors, PA-C  docusate sodium (COLACE) 100 MG capsule Take 1 capsule (100 mg total) by mouth 2 (two) times daily. 02/04/18   Love, Ivan Anchors, PA-C  fluticasone (FLONASE) 50 MCG/ACT nasal spray Place 2 sprays into both nostrils daily.  02/07/17   [provider]  lisinopril (PRINIVIL,ZESTRIL) 40 MG tablet Take 0.5 tablets (20 mg total) by mouth every morning. 02/04/18   Love, Ivan Anchors, PA-C  meclizine (ANTIVERT) 25 MG tablet Take 1 tablet (25 mg total) by mouth 2 (two) times daily as needed for dizziness. 02/04/18   Love, Ivan Anchors, PA-C  megestrol (MEGACE) 40 MG tablet Take 1 tablet (40 mg total) by mouth 3 (three) times daily. 01/13/18   Cross, Lenna Sciara D, NP  Menthol-Methyl Salicylate (MUSCLE RUB) 10-15 % CREA Apply 1 application topically 2 (two) times daily. 02/04/18   Love, Ivan Anchors, PA-C  omeprazole (PRILOSEC) 40 MG capsule Take 40 mg by mouth at bedtime.     [provider]    Physical Exam: Vitals:   02/05/18 0330 02/05/18 0413 02/05/18 0430 02/05/18 0516  BP: (!) 110/46 (!) 111/56 (!) 98/56 (!) 109/54  Pulse: 87 90 89 87  Resp: 20   16  Temp:    97.9 F (36.6 C)  TempSrc:    Oral  SpO2: 100% 91% 96% 100%  Weight:    63.9 kg (140 lb 14 oz)  Height:    5\' 2"  (1.575 m)        Constitutional: Moderately built and nourished. Vitals:   02/05/18 0330 02/05/18 0413 02/05/18 0430 02/05/18 0516  BP: (!) 110/46 (!) 111/56 (!) 98/56 (!) 109/54  Pulse: 87 90 89 87  Resp: 20   16  Temp:    97.9 F (36.6 C)  TempSrc:    Oral  SpO2: 100% 91% 96% 100%  Weight:    63.9 kg (140 lb 14 oz)  Height:    5\' 2"  (1.575 m)   Eyes: Anicteric no pallor. ENMT: No discharge from the ears eyes nose or mouth.  Neck: No mass felt.  No neck rigidity. Respiratory: No rhonchi or crepitations. Cardiovascular: S1-S2 heard no murmurs appreciated. Abdomen: Soft nontender bowel sounds present. Musculoskeletal: No edema.  No joint effusion. Skin: No rash.  Skin appears warm. Neurologic: Alert but disoriented does not follow commands spastic paraparesis of both lower extremities.  Appears to be chronic. Psychiatric: Appears confused.   Labs on Admission: I have personally reviewed following labs and imaging studies  CBC: Recent Labs  Lab 02/02/18 0505 02/05/18 0045 02/05/18 0058  WBC 7.8 11.2*  --   NEUTROABS  --  8.7*  --   HGB 10.5* 10.0* 9.9*  HCT 33.0* 32.0* 29.0*  MCV 94.3 96.7  --   PLT 197 210  --    Basic Metabolic Panel: Recent Labs  Lab 02/02/18 0505 02/03/18 0555 02/04/18 0544 02/05/18 0045 02/05/18 0058  NA 141 137 135 140 138  K 4.6 4.7 4.3 4.8 4.7  CL 114* 113* 111 114* 111  CO2 18* 18* 17* 14*  --   GLUCOSE 107* 109* 95 155* 151*  BUN 40* 40* 27* 38* 40*  CREATININE 1.16* 1.16* 1.00 1.63* 1.60*  CALCIUM 8.9 8.7* 8.2* 8.8*  --    GFR: Estimated Creatinine Clearance: 21.3 mL/min (A) (by C-G formula based on SCr of 1.6 mg/dL (H)). Liver Function Tests: Recent Labs  Lab 02/05/18 0045  AST 30  ALT 21  ALKPHOS 113  BILITOT 0.6  PROT 6.2*  ALBUMIN 3.3*   No results for input(s): LIPASE, AMYLASE in the last 168 hours. No results for input(s): AMMONIA in the last 168 hours. Coagulation Profile: Recent Labs  Lab 02/05/18 0045 02/05/18 0337   INR 1.81 1.90   Cardiac Enzymes: Recent Labs  Lab 02/05/18 0045  TROPONINI 1.65*   BNP (last 3 results) No results for input(s): PROBNP in the last 8760 hours. HbA1C: No results for input(s): HGBA1C in the last 72 hours. CBG: Recent Labs  Lab 02/05/18 0216  GLUCAP 134*   Lipid Profile: No results for input(s): CHOL, HDL, LDLCALC, TRIG, CHOLHDL, LDLDIRECT in the last 72 hours. Thyroid Function Tests: No results for input(s): TSH, T4TOTAL, FREET4, T3FREE, THYROIDAB in the last 72 hours. Anemia Panel: No results for input(s): VITAMINB12, FOLATE, FERRITIN, TIBC, IRON, RETICCTPCT in the last 72 hours. Urine analysis:    Component Value Date/Time   COLORURINE YELLOW 10/22/2015 0056   APPEARANCEUR CLEAR 10/22/2015 0056   LABSPEC 1.019 10/22/2015 0056   PHURINE 6.0 10/22/2015 0056   GLUCOSEU NEGATIVE 10/22/2015 0056   HGBUR TRACE (A) 10/22/2015 0056   BILIRUBINUR NEGATIVE 10/22/2015 0056   KETONESUR NEGATIVE 10/22/2015 0056   PROTEINUR NEGATIVE 10/22/2015 0056   UROBILINOGEN 1.0 03/13/2009 1447   NITRITE NEGATIVE 10/22/2015 0056   LEUKOCYTESUR NEGATIVE 10/22/2015 0056   Sepsis Labs: @LABRCNTIP (procalcitonin:4,lacticidven:4) )No results found for this or any previous visit (from the past 240 hour(s)).   Radiological Exams on Admission: Dg Chest 2 View  Result Date: 02/05/2018 CLINICAL DATA:  Altered mental status EXAM: CHEST - 2 VIEW COMPARISON:  CTA chest dated 01/24/2018 FINDINGS: Lungs are clear.  No pleural effusion or pneumothorax. The heart is normal in size. Degenerative changes of the bilateral shoulders and thoracic spine. IMPRESSION: No evidence of acute cardiopulmonary disease. Electronically Signed   By: Julian Hy M.D.   On: 02/05/2018 01:27   Ct Head Wo Contrast  Result Date: 02/05/2018 CLINICAL DATA:  Altered mental status. Focal neuro deficit, > 6 hrs, stroke suspected EXAM: CT HEAD WITHOUT CONTRAST TECHNIQUE:  Contiguous axial images were obtained from  the base of the skull through the vertex without intravenous contrast. COMPARISON:  Head CT and brain MRI 11 days ago 01/24/2018 FINDINGS: Brain: Small area of cortical and subcortical low-density in the posterior right occipital and posterior left parietooccipital lobes, not definitively seen on prior exam, and not definitively corresponding to small watershed ischemia on prior MRI. Additional area of subcortical low-density in the left frontal lobe, in the area of small punctate infarcts on prior MRI. No intracranial hemorrhage. Remote left cerebellar infarct. Background chronic small vessel ischemia is unchanged. No hydrocephalus, the basilar cisterns are patent. Vascular: Atherosclerosis of skullbase vasculature without hyperdense vessel or abnormal calcification. Skull: Frontal hyperostosis, unchanged.  No acute finding. Sinuses/Orbits: Bilateral cataract resection.  No acute abnormality. Other: None. IMPRESSION: 1. Findings concerning for multifocal subacute ischemia involving the right occipital, left parietooccipital, and left frontal lobe. These areas of ischemia are larger than that seen on brain MRI 10 days ago when there were multiple punctate acute infarcts. Consider MRI follow-up. 2. No hemorrhage.  Stable degree of chronic small vessel ischemia. Electronically Signed   By: Jeb Levering M.D.   On: 02/05/2018 02:31    EKG: Independently reviewed.  Sinus rhythm with LBBB.  Assessment/Plan Principal Problem:   Acute encephalopathy Active Problems:   Paraplegia, spastic congenital (HCC)   Pulmonary embolism (HCC)   Benign essential HTN   Vaginal bleeding, abnormal    1. Acute encephalopathy likely from new stroke -MRI brain has been ordered.  We will get speech therapy evaluation.  Neurology was notified.  Presently holding off apixaban due to awaiting swallow evaluation also due to active vaginal bleeding. 2. History of recent pulmonary embolism -discussed with patient's son on holding  anticoagulation due to active vaginal bleeding.  At this time signed consents. 3. History of endometrial carcinoma with active vaginal bleed.  Please consult patient's OB/GYN in a.m. 4. Elevated troponin -we will cycle cardiac markers.  Cardiology notified. 5. Hypertension we will keep patient on permissive hypertension due to possible stroke.  Will keep patient on PRN IV hydralazine for now for systolic blood pressure more than 220.  Once patient passes swallow may have to restart patient's oral antihypertensives.   DVT prophylaxis: SCDs. Code Status: DNR as confirmed by patient's son. Family Communication: Patient's son. Disposition Plan: To be determined. Consults called: Neurology cardiology and palliative care.  Admission status: Inpatient.   Rise Patience MD [facility] Pager [phone].  If 7PM-7AM, please contact night-coverage [email or web address] Password [email or web address]  02/05/2018, 5:55 AM

## 2018-02-05 NOTE — ED Notes (Signed)
Unable to perform full neurological assessment. Pt refusing to follow commands.

## 2018-02-05 NOTE — Progress Notes (Signed)
OT Cancellation Note  Patient Details Name: Vicki Stein MRN: [id number] DOB: December 25, 1927  Pt at procedure. OT will continue to f/u as indicated/ordered.     Cancelled Treatment:    Reason Eval/Treat Not Completed: Patient at procedure or test/ unavailable  Curtis Sites, OTR/L 02/05/2018, 7:50 AM

## 2018-02-05 NOTE — Progress Notes (Signed)
[facility]  Patient Status: Active (receiving services up to time of hospitalization)  Referred for services but readmitted to the Hospital prior to start of care.  [facility] is providing the following services: PT and OT  If patient discharges after hours, please call [phone].   Vicki Stein 02/05/2018, 2:43 PM

## 2018-02-05 NOTE — ED Provider Notes (Signed)
[facility] EMERGENCY DEPARTMENT Provider Note   CSN: [id number] Arrival date & time: 02/05/18  0012     History   Chief Complaint Chief Complaint  Patient presents with  . Altered Mental Status    HPI Vicki Stein is a 84 y.o. female.  Level 5 caveat for altered mental status.  Patient does not answer questions and is very uncooperative.  EMS reports she was last seen normal at 11 AM today.  She was just discharged from rehab today after admission for cardioembolic strokes on July 20.  She also was found to have a pulmonary embolism and is on Eliquis.  At reported discharge patient was awake and alert without any dysarthria or aphasia. She had normal strength in her upper extremities and weakness in her bilateral legs. Patient now will not cooperate and will not follow commands but will resist gravity in all extremities.  Family states she was having difficulty speaking at home as well as walking into things.  No known trauma.  The history is provided by the patient and the EMS personnel. The history is limited by the condition of the patient.  Altered Mental Status      Past Medical History:  Diagnosis Date  . Arthritis    all over  . Back pain   . Blood dyscrasia    on blood thiners  . Borderline diabetes   . Cerebrovascular disease   . Coronary artery disease    a. LHC (9/14):  pLM 30, oD1 50, D2 40-50, pRI 30, mCFX 30, mRCA 30, EF 65%  . GERD (gastroesophageal reflux disease)   . Gout   . Hemorrhoids   . High cholesterol   . History of kidney stones    60 years ago  . Hyperlipemia   . Hypertension   . Migraines   . Movement disorder   . 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   Marland Kitchen Peripheral vascular disease (HCC)    poor circulation lower legs  . Pneumonia    30 years ago  . Pre-diabetes    borderline  . Reflux   . Stroke [facility])    " 5 mini strokes"    Patient Active Problem List   Diagnosis Date Noted  . Vaginal  bleeding, abnormal 02/03/2018  . Endometrial cancer ([place])   . Chronic pain syndrome   . Anemia of chronic disease   . Embolic stroke ([place]) 31/49/7026  . Benign essential HTN   . Hearing loss   . Prediabetes   . Acute blood loss anemia   . Cerebral embolism with cerebral infarction 01/25/2018  . CVA (cerebral vascular accident) ([place]) 01/25/2018  . Generalized weakness   . Blurred vision, bilateral   . Elevated troponin   . Endometrial adenocarcinoma ([place]) 01/24/2018  . Coronary artery disease 01/24/2018  . Cerebrovascular disease in cancer patient ([place]) 01/24/2018  . Pulmonary embolism ([place]) 01/24/2018  . Chest pain 03/25/2013  . Dyslipidemia 03/25/2013  . HTN (hypertension) 03/25/2013  . Gout 03/25/2013  . Unstable angina ([place]) 03/25/2013  . 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 Paraplegia, spastic congenital [facility])     Past Surgical History:  Procedure Laterality Date  . APPENDECTOMY    . CATARACT EXTRACTION W/ INTRAOCULAR LENS  IMPLANT, BILATERAL    . CHOLECYSTECTOMY    . COLONOSCOPY    . DILATATION & CURETTAGE/HYSTEROSCOPY WITH MYOSURE N/A 06/25/2017   Procedure: DILATATION & CURETTAGE/HYSTEROSCOPY;  Surgeon: Janyth Pupa, DO;  Location: [facility]  ORS;  Service: Gynecology;  Laterality: N/A;  . DILATION AND CURETTAGE OF UTERUS    . ESOPHAGOGASTRODUODENOSCOPY N/A 11/14/2015   Procedure: ESOPHAGOGASTRODUODENOSCOPY (EGD);  Surgeon: Laurence Spates, MD;  Location: [facility] ENDOSCOPY;  Service: Endoscopy;  Laterality: N/A;  . HEMORRHOID SURGERY    . LEFT HEART CATHETERIZATION WITH CORONARY ANGIOGRAM N/A 03/25/2013   Procedure: LEFT HEART CATHETERIZATION WITH CORONARY ANGIOGRAM;  Surgeon: Burnell Blanks, MD;  Location: [facility] CATH LAB;  Service: Cardiovascular;  Laterality: N/A;  . ROTATOR CUFF REPAIR    . TONSILLECTOMY    . TUBAL LIGATION       OB History   None      Home Medications    Prior to Admission medications   Medication Sig Start Date End Date Taking?  Authorizing Provider  acetaminophen (TYLENOL) 500 MG tablet Take 500 mg by mouth every 6 (six) hours as needed for mild pain or moderate pain.    [provider]  amLODipine (NORVASC) 2.5 MG tablet Take 1 tablet (2.5 mg total) by mouth daily. 02/04/18   Love, Ivan Anchors, PA-C  apixaban (ELIQUIS) 5 MG TABS tablet Take 1 tablet (5 mg total) by mouth 2 (two) times daily. 02/04/18   Love, Ivan Anchors, PA-C  atorvastatin (LIPITOR) 40 MG tablet Take 1 tablet (40 mg total) by mouth every evening. 02/04/18   Love, Ivan Anchors, PA-C  Baclofen 5 MG TABS Take 5 mg by mouth 3 (three) times daily. 02/04/18   Love, Ivan Anchors, PA-C  docusate sodium (COLACE) 100 MG capsule Take 1 capsule (100 mg total) by mouth 2 (two) times daily. 02/04/18   Love, Ivan Anchors, PA-C  fluticasone (FLONASE) 50 MCG/ACT nasal spray Place 2 sprays into both nostrils daily.  02/07/17   [provider]  lisinopril (PRINIVIL,ZESTRIL) 40 MG tablet Take 0.5 tablets (20 mg total) by mouth every morning. 02/04/18   Love, Ivan Anchors, PA-C  meclizine (ANTIVERT) 25 MG tablet Take 1 tablet (25 mg total) by mouth 2 (two) times daily as needed for dizziness. 02/04/18   Love, Ivan Anchors, PA-C  megestrol (MEGACE) 40 MG tablet Take 1 tablet (40 mg total) by mouth 3 (three) times daily. 01/13/18   Cross, Lenna Sciara D, NP  Menthol-Methyl Salicylate (MUSCLE RUB) 10-15 % CREA Apply 1 application topically 2 (two) times daily. 02/04/18   Love, Ivan Anchors, PA-C  omeprazole (PRILOSEC) 40 MG capsule Take 40 mg by mouth at bedtime.     [provider]    Family History Family History  Problem Relation Age of Onset  . Heart Problems Mother   . Heart Problems Father   . Heart attack Son     Social History Social History   Tobacco Use  . Smoking status: Never Smoker  . Smokeless tobacco: Never Used  Substance Use Topics  . Alcohol use: No  . Drug use: No     Allergies   Aleve [naproxen sodium]; Bactrim [sulfamethoxazole-trimethoprim]; Motrin  [ibuprofen]; Penicillins; and Ultram [tramadol]   Review of Systems Review of Systems  Unable to perform ROS: Mental status change     Physical Exam Updated Vital Signs BP (!) 116/47   Pulse 100   Temp 98.1 F (36.7 C) (Axillary)   Resp (!) 21   Ht 5\' 2"  (1.575 m)   Wt 62.1 kg (137 lb)   BMI 25.06 kg/m   Physical Exam  Constitutional: She appears well-developed and well-nourished. No distress.   patient is no distress.  She keeps her eyes closed  and resists any kind of questioning.  HENT:  Head: Normocephalic and atraumatic.  Mouth/Throat: Oropharynx is clear and moist. No oropharyngeal exudate.  Eyes: Pupils are equal, round, and reactive to light. Conjunctivae and EOM are normal.  Neck: Normal range of motion. Neck supple.  No meningismus.  Cardiovascular: Normal rate, regular rhythm, normal heart sounds and intact distal pulses.  No murmur heard. Pulmonary/Chest: Effort normal and breath sounds normal. No respiratory distress. She exhibits no tenderness.  Abdominal: Soft. There is no tenderness. There is no rebound and no guarding.  Genitourinary:  Genitourinary Comments: Patient having some vaginal bleeding.  Musculoskeletal: Normal range of motion. She exhibits no edema or tenderness.  Neurological: She is alert. No cranial nerve deficit. Coordination normal.  Patient unable to cooperate for neuro exam.  She will not follow commands.  She does not appear to have any obvious deficits.  She states that her arm hurts with the blood pressure cuff and states "leave me alone".  There is no obvious facial droop.  She will not hold arms up against gravity but she will resist in all extremities.  She will not hold legs up against gravity but will resist any manipulation.  Skin: Skin is warm. Capillary refill takes less than 2 seconds. There is pallor.  Nursing note and vitals reviewed.    ED Treatments / Results  Labs (all labs ordered are listed, but only abnormal results  are displayed) Labs Reviewed  COMPREHENSIVE METABOLIC PANEL - Abnormal; Notable for the following components:      Result Value   Chloride 114 (*)    CO2 14 (*)    Glucose, Bld 155 (*)    BUN 38 (*)    Creatinine, Ser 1.63 (*)    Calcium 8.8 (*)    Total Protein 6.2 (*)    Albumin 3.3 (*)    GFR calc non Af Amer 27 (*)    GFR calc Af Amer 31 (*)    All other components within normal limits  CBC - Abnormal; Notable for the following components:   WBC 11.2 (*)    RBC 3.31 (*)    Hemoglobin 10.0 (*)    HCT 32.0 (*)    All other components within normal limits  PROTIME-INR - Abnormal; Notable for the following components:   Prothrombin Time 20.8 (*)    All other components within normal limits  DIFFERENTIAL - Abnormal; Notable for the following components:   Neutro Abs 8.7 (*)    Monocytes Absolute 1.5 (*)    All other components within normal limits  TROPONIN I - Abnormal; Notable for the following components:   Troponin I 1.65 (*)    All other components within normal limits  CBG MONITORING, ED - Abnormal; Notable for the following components:   Glucose-Capillary 134 (*)    All other components within normal limits  I-STAT CHEM 8, ED - Abnormal; Notable for the following components:   BUN 40 (*)    Creatinine, Ser 1.60 (*)    Glucose, Bld 151 (*)    TCO2 17 (*)    Hemoglobin 9.9 (*)    HCT 29.0 (*)    All other components within normal limits  I-STAT TROPONIN, ED - Abnormal; Notable for the following components:   Troponin i, poc 1.42 (*)    All other components within normal limits  ETHANOL  APTT  RAPID URINE DRUG SCREEN, HOSP PERFORMED  URINALYSIS, ROUTINE W REFLEX MICROSCOPIC  PROTIME-INR  TYPE AND SCREEN  EKG EKG Interpretation  Date/Time:  Thursday February 05 2018 00:45:58 EDT Ventricular Rate:  98 PR Interval:    QRS Duration: 121 QT Interval:  371 QTC Calculation: 474 R Axis:   -32 Text Interpretation:  Sinus rhythm Atrial premature complex Left  bundle branch block No significant change was found Confirmed by Ezequiel Essex [phone]) on 02/05/2018 1:22:32 AM   Radiology Dg Chest 2 View  Result Date: 02/05/2018 CLINICAL DATA:  Altered mental status EXAM: CHEST - 2 VIEW COMPARISON:  CTA chest dated 01/24/2018 FINDINGS: Lungs are clear.  No pleural effusion or pneumothorax. The heart is normal in size. Degenerative changes of the bilateral shoulders and thoracic spine. IMPRESSION: No evidence of acute cardiopulmonary disease. Electronically Signed   By: Julian Hy M.D.   On: 02/05/2018 01:27   Ct Head Wo Contrast  Result Date: 02/05/2018 CLINICAL DATA:  Altered mental status. Focal neuro deficit, > 6 hrs, stroke suspected EXAM: CT HEAD WITHOUT CONTRAST TECHNIQUE: Contiguous axial images were obtained from the base of the skull through the vertex without intravenous contrast. COMPARISON:  Head CT and brain MRI 11 days ago 01/24/2018 FINDINGS: Brain: Small area of cortical and subcortical low-density in the posterior right occipital and posterior left parietooccipital lobes, not definitively seen on prior exam, and not definitively corresponding to small watershed ischemia on prior MRI. Additional area of subcortical low-density in the left frontal lobe, in the area of small punctate infarcts on prior MRI. No intracranial hemorrhage. Remote left cerebellar infarct. Background chronic small vessel ischemia is unchanged. No hydrocephalus, the basilar cisterns are patent. Vascular: Atherosclerosis of skullbase vasculature without hyperdense vessel or abnormal calcification. Skull: Frontal hyperostosis, unchanged.  No acute finding. Sinuses/Orbits: Bilateral cataract resection.  No acute abnormality. Other: None. IMPRESSION: 1. Findings concerning for multifocal subacute ischemia involving the right occipital, left parietooccipital, and left frontal lobe. These areas of ischemia are larger than that seen on brain MRI 10 days ago when there were multiple  punctate acute infarcts. Consider MRI follow-up. 2. No hemorrhage.  Stable degree of chronic small vessel ischemia. Electronically Signed   By: Jeb Levering M.D.   On: 02/05/2018 02:31    Procedures Procedures (including critical care time)  Medications Ordered in ED Medications - No data to display   Initial Impression / Assessment and Plan / ED Course  I have reviewed the triage vital signs and the nursing notes.  Pertinent labs & imaging results that were available during my care of the patient were reviewed by me and considered in my medical decision making (see chart for details).    Patient with recent admission for cardioembolic strokes presenting with altered mental status, last seen normal at 11 AM.  Patient uncooperative for neurological exam.  Discussed with Dr. Rory Percy neurology on arrival.  He agrees not to activate code stroke but pursuepresumed metabolic and infectious encephalopathy work-up.  Patient also recently started on baclofen which may be contributing to her AMS. Dr. Rory Percy confirms that patient would not be a candidate for tPA or IR intervention.  CT scan shows worsening of subacute ischemia without hemorrhage.  Troponin elevated at 1.6.  Patient did have elevated troponin during previous admission but was not this high.  Patient denies chest pain.  EKG shows left bundle branch block which is unchanged.  Patient having some vaginal bleeding. Does have a history of endometrial cancer and was recently started on eliquis. She denies abdominal pain or back pain.  Hemoglobin is stable.  Son at bedside  confirms DNR and DNI.  He is agreeable to blood transfusion.  Patient becoming more responsive and cooperative. She denies pain. She is able to followup commands and move her upper and lower extremities to command.   Progressive strokes as well as elevated troponin and vaginal bleeding d/w son at bedside. He is agreeable to blood transfusion PRN but patient is DNRDNI.    Admission d/w Dr. Hal Hope. D/w Dr. Rory Percy or neurology and Dr. [place] Lions of cardiology as well.  CRITICAL CARE Performed by: Ezequiel Essex Total critical care time: 25minutes Critical care time was exclusive of separately billable procedures and treating other patients. Critical care was necessary to treat or prevent imminent or life-threatening deterioration. Critical care was time spent personally by me on the following activities: development of treatment plan with patient and/or surrogate as well as nursing, discussions with consultants, evaluation of patient's response to treatment, examination of patient, obtaining history from patient or surrogate, ordering and performing treatments and interventions, ordering and review of laboratory studies, ordering and review of radiographic studies, pulse oximetry and re-evaluation of patient's condition.   Final Clinical Impressions(s) / ED Diagnoses   Final diagnoses:  Acute encephalopathy  Elevated troponin  Cerebrovascular accident (CVA), unspecified mechanism [facility])    ED Discharge Orders    None       Ezequiel Essex, MD 02/05/18 1001

## 2018-02-05 NOTE — Progress Notes (Signed)
Vicki Stein is a 84 y.o. female with history of recent admission for embolic stroke and pulmonary embolism on apixaban, endometrial carcinoma on the Megace recently IUD was removed.  Patient was eventually discharged to CIR and was discharged home yesterday following which patient was found to be more confused and not herself after reaching home.    In the ER patient appears disoriented and CT head shows ischemic changes larger than the previous one.  On-call neurologist was notified and at this time neurologist recommended MRI brain.  Troponin was also elevated and troponin was elevated during previous admission also.  In the ER patient also was noted to have increasing vaginal bleeding.  Patient admitted for further management and observation.    Patients son did not want any interventions at this time and want to focus on her comfort alone and patient is also refusing medications and telemetry.  Appreciate palliative input and will monitor her with therapy evaluations pending and plan for disposition accordingly.    Hosie Poisson, MD [phone]

## 2018-02-05 NOTE — ED Notes (Signed)
Patient transported to X-ray 

## 2018-02-05 NOTE — ED Notes (Signed)
Dr. Wyvonnia Dusky notified of elevated I-stat trop result of 1.42

## 2018-02-05 NOTE — Progress Notes (Signed)
Social Work Discharge Note  The overall goal for the admission was met for:   Discharge location: Yes - home with family to work out 24/7 supervision  Length of Stay: Yes - 8 days  Discharge activity level: Yes - 24/7 supervision  Home/community participation: Yes  Services provided included: MD, RD, PT, OT, RN, Pharmacy and SW  Financial Services: Medicare and Private Insurance: [employer] supplement  Follow-up services arranged: Home Health: PT/OT/RN from [facility], DME: pt has all needed DME at home already and Patient/Family has no preference for HH/DME agencies  Comments (or additional information): Pt's son, Jon, came for family education on Monday and said that pt is functionally and cognitively at baseline and has dealt with her disability her entire life.  He, his brother, and his niece will spend time with pt 24/7 initially, but stated that pt is very independent and will want to be alone, as she has been on her own for 22 years since her husband died.   Patient/Family verbalized understanding of follow-up arrangements: Yes  Individual responsible for coordination of the follow-up plan: pt and her sons  Confirmed correct DME delivered: ,  [facility] 02/05/2018    ,  [facility] 

## 2018-02-05 NOTE — ED Notes (Addendum)
Dr.Rancour notified of critical lab value. Troponin 1.65. Orders placed.

## 2018-02-05 NOTE — Evaluation (Addendum)
Speech Language Pathology Evaluation Patient Details Name: Vicki Stein MRN: [id number] DOB: 1927-08-24 Today's Date: 02/05/2018 Time: 8469-6295 SLP Time Calculation (min) (ACUTE ONLY): 15 min  Problem List:  Patient Active Problem List   Diagnosis Date Noted  . Acute encephalopathy 02/05/2018  . Vaginal bleeding, abnormal 02/03/2018  . Endometrial cancer ([place])   . Chronic pain syndrome   . Anemia of chronic disease   . Embolic stroke ([place]) 28/41/3244  . Benign essential HTN   . Hearing loss   . Prediabetes   . Acute blood loss anemia   . Cerebral embolism with cerebral infarction 01/25/2018  . CVA (cerebral vascular accident) ([place]) 01/25/2018  . Generalized weakness   . Blurred vision, bilateral   . Elevated troponin   . Endometrial adenocarcinoma ([place]) 01/24/2018  . Coronary artery disease 01/24/2018  . Cerebrovascular disease in cancer patient ([place]) 01/24/2018  . Pulmonary embolism ([place]) 01/24/2018  . Chest pain 03/25/2013  . Dyslipidemia 03/25/2013  . HTN (hypertension) 03/25/2013  . Gout 03/25/2013  . Unstable angina ([place]) 03/25/2013  . 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 Paraplegia, spastic congenital [facility])    Past Medical History:  Past Medical History:  Diagnosis Date  . Arthritis    all over  . Back pain   . Blood dyscrasia    on blood thiners  . Borderline diabetes   . Cerebrovascular disease   . Coronary artery disease    a. LHC (9/14):  pLM 30, oD1 50, D2 40-50, pRI 30, mCFX 30, mRCA 30, EF 65%  . GERD (gastroesophageal reflux disease)   . Gout   . Hemorrhoids   . High cholesterol   . History of kidney stones    60 years ago  . Hyperlipemia   . Hypertension   . Migraines   . Movement disorder   . 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   Marland Kitchen Peripheral vascular disease (HCC)    poor circulation lower legs  . Pneumonia    30 years ago  . Pre-diabetes    borderline  . Reflux   . Stroke [facility])    " 5 mini strokes"    Past Surgical History:  Past Surgical History:  Procedure Laterality Date  . APPENDECTOMY    . CATARACT EXTRACTION W/ INTRAOCULAR LENS  IMPLANT, BILATERAL    . CHOLECYSTECTOMY    . COLONOSCOPY    . DILATATION & CURETTAGE/HYSTEROSCOPY WITH MYOSURE N/A 06/25/2017   Procedure: DILATATION & CURETTAGE/HYSTEROSCOPY;  Surgeon: Janyth Pupa, DO;  Location: [facility] ORS;  Service: Gynecology;  Laterality: N/A;  . DILATION AND CURETTAGE OF UTERUS    . ESOPHAGOGASTRODUODENOSCOPY N/A 11/14/2015   Procedure: ESOPHAGOGASTRODUODENOSCOPY (EGD);  Surgeon: Laurence Spates, MD;  Location: [facility] ENDOSCOPY;  Service: Endoscopy;  Laterality: N/A;  . HEMORRHOID SURGERY    . LEFT HEART CATHETERIZATION WITH CORONARY ANGIOGRAM N/A 03/25/2013   Procedure: LEFT HEART CATHETERIZATION WITH CORONARY ANGIOGRAM;  Surgeon: Burnell Blanks, MD;  Location: [facility] CATH LAB;  Service: Cardiovascular;  Laterality: N/A;  . ROTATOR CUFF REPAIR    . TONSILLECTOMY    . TUBAL LIGATION     HPI:  Vicki Stein is a 84 y.o. female with history of recent admission for embolic stroke and pulmonary embolism on apixaban, endometrial carcinoma on the Megace recently IUD was removed.  Patient was eventually discharged to CIR and was discharged home yesterday following which patient was found to be more confused and not herself after reaching home.  Patient has  known history of spastic paraparesis.   Assessment / Plan / Recommendation Clinical Impression   Currently, pt is oriented to self only, has decreased sustained attention to tasks, and decreased willingness to participate in tasks without significant encouragement from multiple staff members.  All of these deficits impact higher level cognitive processes.  Reports noted in chart from therapy team (including SLP) of baseline cognitive deficits but pt was able to live independently.  Pt is withdrawn and tearful, stating repeatedly that she wants to go to her home and "Theres's nothing they can  do for me."  Pt attempted to refuse both swallow and cognitive evaluation as well as morning med administration from RN and telemonitoring.  Note palliative care consult ordered.  Will await report from palliative care and follow up for cognitive treatment as is appropriate.      SLP Assessment  SLP Recommendation/Assessment: Patient needs continued Speech Lanaguage Pathology Services    Follow Up Recommendations  Other (comment)(To be determined)    Frequency and Duration min 1 x/week         SLP Evaluation Cognition  Overall Cognitive Status: Impaired/Different from baseline Arousal/Alertness: Awake/alert Orientation Level: Oriented to person;Disoriented to place;Disoriented to time;Disoriented to situation Attention: Sustained Sustained Attention: Impaired Sustained Attention Impairment: Verbal basic;Functional basic Memory: Impaired Memory Impairment: Retrieval deficit;Storage deficit Awareness: Impaired Awareness Impairment: Intellectual impairment Behaviors: (withdrawn) Safety/Judgment: Impaired       Comprehension  Auditory Comprehension Overall Auditory Comprehension: Appears within functional limits for tasks assessed    Expression Expression Primary Mode of Expression: Verbal Verbal Expression Overall Verbal Expression: Appears within functional limits for tasks assessed   Oral / Motor  Oral Motor/Sensory Function Overall Oral Motor/Sensory Function: Within functional limits Motor Speech Overall Motor Speech: Appears within functional limits for tasks assessed   GO                    Vicki Stein, Vicki Stein 02/05/2018, 10:03 AM

## 2018-02-05 NOTE — Progress Notes (Signed)
Patient admitted form ED. Disoriented. Actively bleeding from Vagina, moderated amount of bright red blood. MD notified. Patient not oriented to time or place. Not able to answer any question. But following some command to turn.  CHG done. Telemetry applied and CCMD notified times two nurses.

## 2018-02-06 DIAGNOSIS — C541 Malignant neoplasm of endometrium: Secondary | ICD-10-CM

## 2018-02-06 DIAGNOSIS — Z7189 Other specified counseling: Secondary | ICD-10-CM

## 2018-02-06 DIAGNOSIS — I639 Cerebral infarction, unspecified: Principal | ICD-10-CM

## 2018-02-06 DIAGNOSIS — R748 Abnormal levels of other serum enzymes: Secondary | ICD-10-CM

## 2018-02-06 DIAGNOSIS — I2699 Other pulmonary embolism without acute cor pulmonale: Secondary | ICD-10-CM

## 2018-02-06 DIAGNOSIS — I10 Essential (primary) hypertension: Secondary | ICD-10-CM

## 2018-02-06 NOTE — Evaluation (Signed)
Occupational Therapy Evaluation Patient Details Name: Vicki Stein MRN: [id number] DOB: 09/06/1927 Today's Date: 02/06/2018    History of Present Illness Vicki Stein is a 84 y.o. female with history of recent admission for embolic stroke and pulmonary embolism on apixaban, endometrial carcinoma on the Megace recently IUD was removed.  Patient was eventually discharged to CIR and was discharged home yesterday following which patient was found to be more confused and not herself after reaching home. This admission MRI: Continued bilateral anterior and posterior circulation ischemia.This suggests ongoing embolic events from the heart or proximal aorta. Larger patchy 1-3 centimeter scattered infarcts demonstratedtoday in both cerebral and cerebellar hemispheres. Patient has known history of spastic paraparesis. PMH including CAD, GERD, HTN, paraplegia (spastic congenital), PVD, and "5 mini strokes".     Clinical Impression   This 84 yo female admitted with above presents to acute OT with decreased balance, decreased mobility, spastic paraparesis, HOH, impulsiveness all affecting safety and independence with basic ADLs. She will benefit from acute OT with follow up at SNF. She currently is Max A for bed mobility and Max/Mod A for sit<>stand with Max A to stand pivot--total A for toileting.    Follow Up Recommendations  SNF;Supervision/Assistance - 24 hour    Equipment Recommendations  None recommended by OT       Precautions / Restrictions Precautions Precautions: Fall Restrictions Weight Bearing Restrictions: No      Mobility Bed Mobility Overal bed mobility: Needs Assistance Bed Mobility: Supine to Sit     Supine to sit: Max assist;+2 for physical assistance     General bed mobility comments: Pt needed max assist to come to EOB. Posterior lean once EOB but able to eventually get to a min guard A   Transfers Overall transfer level: Needs assistance Equipment used: 1 person hand  held assist Transfers: Sit to/from Stand Sit to Stand: Mod assist;Max assist(Max A from EOB, Mod A for 3n1)              Balance Overall balance assessment: Needs assistance Sitting-balance support: No upper extremity supported;Feet supported Sitting balance-Leahy Scale: Poor Sitting balance - Comments: varied from Max A intially to min A at end Postural control: Posterior lean Standing balance support: Bilateral upper extremity supported;During functional activity Standing balance-Leahy Scale: Zero                             ADL either performed or assessed with clinical judgement   ADL Overall ADL's : Needs assistance/impaired Eating/Feeding: Minimal assistance Eating/Feeding Details (indicate cue type and reason): supported sitting Grooming: Minimal assistance Grooming Details (indicate cue type and reason): supported sitting Upper Body Bathing: Minimal assistance Upper Body Bathing Details (indicate cue type and reason): supported sitting Lower Body Bathing: Maximal assistance Lower Body Bathing Details (indicate cue type and reason): Mod A sit<>stand with increased time Upper Body Dressing : Moderate assistance Upper Body Dressing Details (indicate cue type and reason): supported sitting Lower Body Dressing: Total assistance Lower Body Dressing Details (indicate cue type and reason): Mod A sit<>stand with increased time Toilet Transfer: Maximal assistance Toilet Transfer Details (indicate cue type and reason): Mod A sit<>stand with increased time, A to weight shift Toileting- Clothing Manipulation and Hygiene: Total assistance Toileting - Clothing Manipulation Details (indicate cue type and reason): Mod A sit<>stand with increased time             Vision Baseline Vision/History: Wears glasses;Cataracts Wears Glasses: At  all times Patient Visual Report: No change from baseline              Pertinent Vitals/Pain Pain Assessment: No/denies pain      Hand Dominance  right   Extremity/Trunk Assessment Upper Extremity Assessment Upper Extremity Assessment: Generalized weakness           Communication  HOH   Cognition Arousal/Alertness: Awake/alert Behavior During Therapy: Anxious;Impulsive Overall Cognitive Status: Impaired/Different from baseline Area of Impairment: Attention;Memory;Following commands;Safety/judgement;Problem solving                   Current Attention Level: Sustained Memory: Decreased short-term memory Following Commands: Follows one step commands with increased time Safety/Judgement: Decreased awareness of safety;Decreased awareness of deficits   Problem Solving: Slow processing;Decreased initiation;Difficulty sequencing;Requires verbal cues;Requires tactile cues General Comments: She is hard of hearing impacting her ability to follow directions at times. Poor safety awareness noted and decreased awareness in general.               Home Living Family/patient expects to be discharged to:: Skilled nursing facility                                                 OT Problem List: Decreased range of motion;Impaired balance (sitting and/or standing);Decreased cognition;Decreased safety awareness;Decreased knowledge of use of DME or AE      OT Treatment/Interventions: Self-care/ADL training;DME and/or AE instruction;Therapeutic activities;Patient/family education;Balance training    OT Goals(Current goals can be found in the care plan section) Acute Rehab OT Goals Patient Stated Goal: to go home OT Goal Formulation: With patient Time For Goal Achievement: 02/20/18 Potential to Achieve Goals: Good  OT Frequency: Min 2X/week   Barriers to D/C: Decreased caregiver support             AM-PAC PT "6 Clicks" Daily Activity     Outcome Measure Help from another person eating meals?: A Little Help from another person taking care of personal grooming?: A Little Help  from another person toileting, which includes using toliet, bedpan, or urinal?: Total Help from another person bathing (including washing, rinsing, drying)?: A Lot Help from another person to put on and taking off regular upper body clothing?: A Lot Help from another person to put on and taking off regular lower body clothing?: Total 6 Click Score: 12   End of Session Equipment Utilized During Treatment: Gait belt Nurse Communication: Mobility status(use sara stedy (NT in room with me when got pt off of 3n1 with sara stedy))  Activity Tolerance: Patient tolerated treatment well Patient left: in chair;with call bell/phone within reach;with chair alarm set  OT Visit Diagnosis: Unsteadiness on feet (R26.81);Other abnormalities of gait and mobility (R26.89);Muscle weakness (generalized) (M62.81);Ataxia, unspecified (R27.0)                Time: 1638-4536 OT Time Calculation (min): 36 min Charges:  OT General Charges $OT Visit: 1 Visit OT Evaluation $OT Eval Moderate Complexity: 1 Mod OT Treatments $Self Care/Home Management : 8-22 mins  [address], OTR/L [phone] 02/06/2018

## 2018-02-06 NOTE — Progress Notes (Signed)
Daily Progress Note   Patient Name: Vicki Stein       Date: 02/06/2018 DOB: 1928/04/01  Age: 84 y.o. MRN#: [id number] Attending Physician: Hosie Poisson, MD Primary Care Physician: Glenford Bayley, DO Admit Date: 02/05/2018  Reason for Consultation/Follow-up: Establishing goals of care  Subjective: Patient awake but pleasantly confused. Visiting with friends at bedside. Denies pain or discomfort. Has eaten a small amount of breakfast and lunch. No s/s of distress.   GOC:  Son, [name], not at bedside this afternoon. Palliative f/u with him via telephone. Discussed events leading up to hospitalization and hospital diagnoses and interventions. [name] has a good understanding of poor prognosis secondary to high risk for stroke, now that anticoagulation has been discontinued. He speaks of his concerns with her "demanding" to go home alone but unfortunately this is NOT an option and he and family are unable to provide 24 hour care to facilitate her staying home.   [name] hopeful she will be monitored at the hospital through the weekend. He confirms main goal of focus on her comfort and symptom management. He speaks of relief from her agitation after prn ativan given. We discussed watchful waiting to determine best place for discharge--SNF with palliative or hospice facility if acute decline over the weekend.   Answered questions and concerns. [name] is hopeful to speak with Dr. Karleen Hampshire either this evening or this weekend.   Length of Stay: 1  Current Medications: Scheduled Meds:  .  stroke: mapping our early stages of recovery book   Does not apply Once  . baclofen  5 mg Oral TID  . LORazepam  0.25 mg Oral BID  . pantoprazole  40 mg Oral Daily  . sodium chloride flush  3 mL Intravenous Q12H    Continuous  Infusions: . sodium chloride      PRN Meds: sodium chloride, acetaminophen **OR** acetaminophen (TYLENOL) oral liquid 160 mg/5 mL **OR** acetaminophen, haloperidol **OR** haloperidol **OR** haloperidol lactate, hydrALAZINE, LORazepam **OR** LORazepam **OR** LORazepam, meclizine, sodium chloride flush  Physical Exam  Constitutional: She is cooperative. She appears ill.  HENT:  Head: Normocephalic and atraumatic.  Cardiovascular: Regular rhythm.  Pulmonary/Chest: No accessory muscle usage. No tachypnea. No respiratory distress.  Neurological: She is alert.  disoriented  Skin: Skin is warm and dry. There is pallor.  Psychiatric: Cognition  and memory are impaired.  Nursing note and vitals reviewed.          Vital Signs: BP 97/71 (BP Location: Left Arm)   Pulse 89   Temp 98.5 F (36.9 C) (Oral)   Resp 19   Ht 5\' 2"  (1.575 m)   Wt 63.9 kg (140 lb 14 oz)   SpO2 98%   BMI 25.77 kg/m  SpO2: SpO2: 98 % O2 Device: O2 Device: Room Air O2 Flow Rate:    Intake/output summary:   Intake/Output Summary (Last 24 hours) at 02/06/2018 1518 Last data filed at 02/06/2018 1332 Gross per 24 hour  Intake 1275 ml  Output -  Net 1275 ml   LBM: Last BM Date: 02/03/18 Baseline Weight: Weight: 62.1 kg (137 lb) Most recent weight: Weight: 63.9 kg (140 lb 14 oz)       Palliative Assessment/Data: PPS 40%     Patient Active Problem List   Diagnosis Date Noted  . Acute encephalopathy 02/05/2018  . Palliative care encounter   . Vaginal bleeding, abnormal 02/03/2018  . Endometrial cancer ([place])   . Chronic pain syndrome   . Anemia of chronic disease   . Embolic stroke ([place]) 78/67/6720  . Benign essential HTN   . Hearing loss   . Prediabetes   . Acute blood loss anemia   . Cerebral embolism with cerebral infarction 01/25/2018  . CVA (cerebral vascular accident) ([place]) 01/25/2018  . Generalized weakness   . Blurred vision, bilateral   . Elevated troponin   . Endometrial adenocarcinoma ([place])  01/24/2018  . Coronary artery disease 01/24/2018  . Cerebrovascular disease in cancer patient ([place]) 01/24/2018  . Pulmonary embolism ([place]) 01/24/2018  . Chest pain 03/25/2013  . Dyslipidemia 03/25/2013  . HTN (hypertension) 03/25/2013  . Gout 03/25/2013  . Unstable angina ([place]) 03/25/2013  . Hereditary spastic paraparesis ([place]) 01/15/2013  . Paraplegia, spastic congenital [facility])     Palliative Care Assessment & Plan   Patient Profile: 84 y.o. female  with past medical history of spastic paraplegia (childhood disease), cerebral vascular disease, PVD, back pain, and grade 2 endometrial cancer on Megace (dx in 06/2017) who was admitted on 02/05/2018 with altered mental status. Patient had a recent admission (discharged 7/23) for PE and CVA.  She was discharged on anticoagulation.  She completed CIR on 7/31 and was discharged to her home.  She returned to the ER the evening of 7/31 with altered mental status.  Imaging shows additional CVAs.  She is also having vaginal bleeding.  Speech therapy evaluation indicated mild aspiration risk.  Patient can have a regular diet. Physical therapy evaluation indicated patient needed max assist to sit edge of bed and was unable to stand.  Assessment: Acute encephalopathy Endometrial carcinoma with active vaginal bleeding New infarcts Hx of recent CVA/PE Spastic congenital paraplegia  Recommendations/Plan:  Watchful waiting. If acute decline (continued bleeding/drop in Hgb with no plans for transfusion), patient may be eligible for residential hospice placement. If stable, awake, and eating, she will likely need SNF with palliative services. She has worked with PT/OT this admission.   Continue current prn medications for anxiety/agitation.    Code Status: DNR/DNI   Code Status Orders  (From admission, onward)        Start     Ordered   02/05/18 1117  Do not attempt resuscitation (DNR)  Continuous    Question Answer Comment  In the event of  cardiac or respiratory ARREST Do not call a "code blue"  In the event of cardiac or respiratory ARREST Do not perform Intubation, CPR, defibrillation or ACLS   In the event of cardiac or respiratory ARREST Use medication by any route, position, wound care, and other measures to relive pain and suffering. May use oxygen, suction and manual treatment of airway obstruction as needed for comfort.      02/05/18 1118    Code Status History    Date Active Date Inactive Code Status Order ID Comments User Context   02/05/2018 0554 02/05/2018 1118 DNR [id number]  Rise Patience, MD Inpatient   01/27/2018 1733 02/04/2018 1414 Full Code [id number]  Flora Lipps Inpatient   01/24/2018 1623 01/27/2018 1658 Full Code [id number]  Lady Deutscher, MD ED   03/25/2013 1715 03/26/2013 1614 Full Code [id number]  Robbie Lis, MD Inpatient   03/25/2013 1714 03/25/2013 1715 Full Code [id number]  Barrett, Evelene Croon, PA-C Inpatient    Advance Directive Documentation     Most Recent Value  Type of Advance Directive  Living will  Pre-existing out of facility DNR order (yellow form or pink MOST form)  -  "MOST" Form in Place?  -       Prognosis:   Unable to determine: guarded with endometrial cancer (not a surgical candidate), active vaginal bleeding, new infarcts and recent CVA/PE. No longer receiving anticoagulation. High risk for decompensation secondary to future strokes.   Discharge Planning:  To Be Determined SNF with palliative versus hospice home if decline  Care plan was discussed with patient, son [name])  Thank you for allowing the Palliative Medicine Team to assist in the care of this patient.   Time In: 1440- Time Out: 1520 Total Time 25min Prolonged Time Billed  no      Greater than 50%  of this time was spent counseling and coordinating care related to the above assessment and plan.  Ihor Dow, FNP-C Palliative Medicine Team  Phone: [phone] Fax: [phone]  Please contact  Palliative Medicine Team phone at [phone] for questions and concerns.

## 2018-02-06 NOTE — Progress Notes (Signed)
PROGRESS NOTE    Vicki Stein  SAY:[id number] DOB: [birth year] DOA: 02/05/2018 PCP: Glenford Bayley, DO    Brief Narrative:  Vicki Stein a 84 y.o.femalewithhistory of recent admission for embolic stroke and pulmonary embolism on apixaban, endometrial carcinoma, recently discharged from CIR was brought in by family for confusion. In the ER patient appears disoriented and CT head shows ischemic changes larger than the previous one. On-call neurologist was notified and at this time neurologist recommended MRI brain. But family/ patient's son and POA does not want any interventions at this time and want to focus on comfort alone.  Palliative care consulted.  Therapy eval recommending SNF.      Assessment & Plan:   Principal Problem:   Acute encephalopathy Active Problems:   Paraplegia, spastic congenital (HCC)   Pulmonary embolism (HCC)   Benign essential HTN   Vaginal bleeding, abnormal   Palliative care encounter   Acute encephalopathy:  Appears to have improved.  MRI brain showed. Larger patchy 1-3 centimeter scattered infarcts demonstrated today in both cerebral and cerebellar hemispheres.    H/o PE:  Was on apixiban held for vaginal bleeding.    H/o endometrial carcinoma with active vaginal bleeding.  Family does not want to do any blood transfusions or any consults for work up.  Family wants to focus on comfort .   Elevated troponin: since family does not any work up done. Cardiology consult was cancelled.    Hypertension Well controlled.   DVT prophylaxis: scd's Code Status:DNR Family Communication: family at bedside.  Disposition Plan: pending therapy evaluation.    Consultants:   Palliative care.   Neurology.   Procedures: None.  Antimicrobials: None.   Subjective: FEELS okay.   Objective: Vitals:   02/05/18 1251 02/05/18 2038 02/06/18 0007 02/06/18 0359  BP: (!) 121/49 (!) 126/47 (!) 117/53 97/71  Pulse: 79 89 92 89  Resp: 18 17 19 19     Temp: 98.7 F (37.1 C) 98.8 F (37.1 C) 98.7 F (37.1 C) 98.5 F (36.9 C)  TempSrc: Oral Oral Oral Oral  SpO2: 100% 98% 97% 98%  Weight:      Height:        Intake/Output Summary (Last 24 hours) at 02/06/2018 1259 Last data filed at 02/06/2018 0400 Gross per 24 hour  Intake 1275 ml  Output -  Net 1275 ml   Filed Weights   02/05/18 0026 02/05/18 0516  Weight: 62.1 kg (137 lb) 63.9 kg (140 lb 14 oz)    Examination:  General exam: Appears calm and comfortable  Respiratory system: Clear to auscultation. Respiratory effort normal. Cardiovascular system: S1 & S2 heard, RRR. No pedal edema. Gastrointestinal system: Abdomen is soft NT ND BS+ Central nervous system: Alert , able to answer simple questions with  yes or no.  Extremities: Symmetric 5 x 5 power. Skin: No rashes, lesions or ulcers Psychiatry:  Mood & affect appropriate.     Data Reviewed: I have personally reviewed following labs and imaging studies  CBC: Recent Labs  Lab 02/02/18 0505 02/05/18 0045 02/05/18 0058 02/05/18 0611 02/05/18 0805  WBC 7.8 11.2*  --  9.1 8.5  NEUTROABS  --  8.7*  --   --   --   HGB 10.5* 10.0* 9.9* 8.7* 8.5*  HCT 33.0* 32.0* 29.0* 27.0* 27.0*  MCV 94.3 96.7  --  93.4 94.4  PLT 197 210  --  180 235   Basic Metabolic Panel: Recent Labs  Lab 02/02/18 0505 02/03/18 0555  02/04/18 0544 02/05/18 0045 02/05/18 0058 02/05/18 0805  NA 141 137 135 140 138 139  K 4.6 4.7 4.3 4.8 4.7 4.4  CL 114* 113* 111 114* 111 117*  CO2 18* 18* 17* 14*  --  14*  GLUCOSE 107* 109* 95 155* 151* 115*  BUN 40* 40* 27* 38* 40* 33*  CREATININE 1.16* 1.16* 1.00 1.63* 1.60* 1.27*  CALCIUM 8.9 8.7* 8.2* 8.8*  --  8.4*   GFR: Estimated Creatinine Clearance: 26.9 mL/min (A) (by C-G formula based on SCr of 1.27 mg/dL (H)). Liver Function Tests: Recent Labs  Lab 02/05/18 0045 02/05/18 0805  AST 30 28  ALT 21 19  ALKPHOS 113 97  BILITOT 0.6 0.5  PROT 6.2* 5.4*  ALBUMIN 3.3* 2.9*   No results  for input(s): LIPASE, AMYLASE in the last 168 hours. No results for input(s): AMMONIA in the last 168 hours. Coagulation Profile: Recent Labs  Lab 02/05/18 0045 02/05/18 0337  INR 1.81 1.90   Cardiac Enzymes: Recent Labs  Lab 02/05/18 0045 02/05/18 0429 02/05/18 0611  TROPONINI 1.65* 1.58* 1.38*   BNP (last 3 results) No results for input(s): PROBNP in the last 8760 hours. HbA1C: No results for input(s): HGBA1C in the last 72 hours. CBG: Recent Labs  Lab 02/05/18 0216 02/05/18 0559  GLUCAP 134* 100*   Lipid Profile: No results for input(s): CHOL, HDL, LDLCALC, TRIG, CHOLHDL, LDLDIRECT in the last 72 hours. Thyroid Function Tests: No results for input(s): TSH, T4TOTAL, FREET4, T3FREE, THYROIDAB in the last 72 hours. Anemia Panel: No results for input(s): VITAMINB12, FOLATE, FERRITIN, TIBC, IRON, RETICCTPCT in the last 72 hours. Sepsis Labs: No results for input(s): PROCALCITON, LATICACIDVEN in the last 168 hours.  No results found for this or any previous visit (from the past 240 hour(s)).       Radiology Studies: Dg Chest 2 View  Result Date: 02/05/2018 CLINICAL DATA:  Altered mental status EXAM: CHEST - 2 VIEW COMPARISON:  CTA chest dated 01/24/2018 FINDINGS: Lungs are clear.  No pleural effusion or pneumothorax. The heart is normal in size. Degenerative changes of the bilateral shoulders and thoracic spine. IMPRESSION: No evidence of acute cardiopulmonary disease. Electronically Signed   By: Julian Hy M.D.   On: 02/05/2018 01:27   Ct Head Wo Contrast  Result Date: 02/05/2018 CLINICAL DATA:  Altered mental status. Focal neuro deficit, > 6 hrs, stroke suspected EXAM: CT HEAD WITHOUT CONTRAST TECHNIQUE: Contiguous axial images were obtained from the base of the skull through the vertex without intravenous contrast. COMPARISON:  Head CT and brain MRI 11 days ago 01/24/2018 FINDINGS: Brain: Small area of cortical and subcortical low-density in the posterior right  occipital and posterior left parietooccipital lobes, not definitively seen on prior exam, and not definitively corresponding to small watershed ischemia on prior MRI. Additional area of subcortical low-density in the left frontal lobe, in the area of small punctate infarcts on prior MRI. No intracranial hemorrhage. Remote left cerebellar infarct. Background chronic small vessel ischemia is unchanged. No hydrocephalus, the basilar cisterns are patent. Vascular: Atherosclerosis of skullbase vasculature without hyperdense vessel or abnormal calcification. Skull: Frontal hyperostosis, unchanged.  No acute finding. Sinuses/Orbits: Bilateral cataract resection.  No acute abnormality. Other: None. IMPRESSION: 1. Findings concerning for multifocal subacute ischemia involving the right occipital, left parietooccipital, and left frontal lobe. These areas of ischemia are larger than that seen on brain MRI 10 days ago when there were multiple punctate acute infarcts. Consider MRI follow-up. 2. No hemorrhage.  Stable  degree of chronic small vessel ischemia. Electronically Signed   By: Jeb Levering M.D.   On: 02/05/2018 02:31   Mr Brain Wo Contrast  Result Date: 02/05/2018 CLINICAL DATA:  84 year old female diagnosed with numerous scattered small cerebral infarcts on 01/24/2018. Altered mental status. EXAM: MRI HEAD WITHOUT CONTRAST TECHNIQUE: Multiplanar, multiecho pulse sequences of the brain and surrounding structures were obtained without intravenous contrast. COMPARISON:  Head CT 0206 hours today. Brain MRI 01/24/2018, intracranial MRA 01/25/2018. FINDINGS: Brain: Study is intermittently degraded by motion artifact despite repeated imaging attempts. Motion artifact on diffusion-weighted imaging today is such that small acute infarcts, such as those demonstrated on 01/24/2018, could be missed. Today, multifocal patchy acute infarcts on the range of 1-3 centimeters in size are demonstrated in the bilateral anterior and  posterior circulation. The bilateral cerebellum, bilateral occipital poles, right parietal lobe, and left greater than right frontal lobes are affected. Many of the same areas demonstrated punctate infarcts on 01/24/2018. Associated cytotoxic edema with T2 and FLAIR hyperintensity which has increased in the affected areas. No associated mass effect. No associated hemorrhage identified. Today the deep gray matter nuclei and brainstem are relatively spared. The No midline shift, mass effect, evidence of mass lesion, ventriculomegaly, extra-axial collection or acute intracranial hemorrhage. Cervicomedullary junction and pituitary are within normal limits. Vascular: Major intracranial vascular flow voids are stable with mild intracranial artery tortuosity. Skull and upper cervical spine: The stable visible cervical spine degeneration. Background bone marrow signal appears normal. Benign appearing bony exostosis at the left vertex re-demonstrated. Sinuses/Orbits: Stable and negative. Other: Mastoid air cells remain clear. Visible internal auditory structures appear normal. Stable scalp and face soft tissues. IMPRESSION: 1. Continued bilateral anterior and posterior circulation ischemia. This suggests ongoing embolic events from the heart or proximal aorta. 2. Larger patchy 1-3 centimeter scattered infarcts demonstrated today in both cerebral and cerebellar hemispheres. No associated hemorrhage or mass effect. 3. Motion artifact on today's study such that small infarcts - like those demonstrated on 01/24/2018 - would be obscured. Electronically Signed   By: Genevie Ann M.D.   On: 02/05/2018 08:14        Scheduled Meds: .  stroke: mapping our early stages of recovery book   Does not apply Once  . baclofen  5 mg Oral TID  . LORazepam  0.25 mg Oral BID  . pantoprazole  40 mg Oral Daily  . sodium chloride flush  3 mL Intravenous Q12H   Continuous Infusions: . sodium chloride       LOS: 1 day    Time spent: 35  minutes.      Hosie Poisson, MD [facility] Pager [phone]   If 7PM-7AM, please contact night-coverage [email or web address] Password [facility] 02/06/2018, 12:59 PM

## 2018-02-07 NOTE — Clinical Social Work Note (Signed)
Clinical Social Work Assessment  Patient Details  Name: Vicki Stein MRN: [id number] Date of Birth: 02-04-1928  Date of referral:  02/07/18               Reason for consult:  Facility Placement, End of Life/Hospice                Permission sought to share information with:  Family Supports Permission granted to share information::  Yes, Verbal Permission Granted  Name::     Vicki Stein [phone], Vicki Stein [phone]  Agency::     Relationship::  son  Contact Information:     Housing/Transportation Living arrangements for the past 2 months:  Single Family Home Source of Information:  Adult Children Patient Interpreter Needed:  None Criminal Activity/Legal Involvement Pertinent to Current Situation/Hospitalization:  No - Comment as needed Significant Relationships:  Adult Children, Other Family Members Lives with:  Self Do you feel safe going back to the place where you live?  Yes Need for family participation in patient care:  Yes (Comment)  Care giving concerns:  Patient was pleasant in the room surrounded by her family. CSW was asked by her two sons to have a conversation outside of patients room as to not upset patient    Social Worker assessment / plan:  CSW met patient and family at bedside to offer support and discuss discharge plan. Family is aware that patient may be needing a Residential Hospice facility and they stated that they want to place her in a facility close by. Both sons were in agreement that they would like patient to be placed at [facility] as that is the easiest facility for both sons to get to. CSW went over the process with family and they stated they understood and gave verbal permission to start process.  CSW contacted Vicki Stein to make a referral on behalf of patient. CSW will continue to follow for bed availability    Employment status:  Retired Forensic scientist:  Medicare PT Recommendations:  [place] / Referral to community resources:  [place]  Patient/Family's Response to care:  Family appreciative of CSW role in care  Patient/Family's Understanding of and Emotional Response to Diagnosis, Current Treatment, and Prognosis:  Family supportive of patient and has clear understanding of her diagnoses   Emotional Assessment Appearance:  Appears stated age Attitude/Demeanor/Rapport:  Unable to Assess Affect (typically observed):  Unable to Assess Orientation:  Oriented to Self Alcohol / Substance use:  Not Applicable Psych involvement (Current and /or in the community):  No (Comment)  Discharge Needs  Concerns to be addressed:  Care Coordination Readmission within the last 30 days:  No Current discharge risk:  Terminally ill, Dependent with Mobility Barriers to Discharge:  Hospice Bed not available   Vicki Neighbors, LCSW 02/07/2018, 1:16 PM

## 2018-02-07 NOTE — Progress Notes (Signed)
Patient restless trying to get out of bed to go home to be with her husband. Pleasantly confused. Putting legs over side rails. Throwing blankets and her stuff animal on the floor. Emotional support given. Tried multi. Times to reoriented patient .Unsuccessfully  Ativan 1 mg p.o given.

## 2018-02-07 NOTE — Progress Notes (Signed)
PROGRESS NOTE    Vicki Stein  RXV:[id number] DOB: 1927-12-10 DOA: 02/05/2018 PCP: Glenford Bayley, DO    Brief Narrative:  Vicki Chalupa Perdueis a 84 y.o.femalewithhistory of recent admission for embolic stroke and pulmonary embolism on apixaban, endometrial carcinoma, recently discharged from CIR was brought in by family for confusion. In the ER patient appears disoriented and CT head shows ischemic changes larger than the previous one. On-call neurologist was notified and at this time neurologist recommended MRI brain. But family/ patient's son and POA does not want any interventions at this time and want to focus on comfort alone.  Palliative care consulted, recommending residential hospice vs SNF with palliative care services.  Therapy eval recommending SNF.   Assessment & Plan:   Principal Problem:   Acute encephalopathy Active Problems:   Paraplegia, spastic congenital (HCC)   Pulmonary embolism (HCC)   Benign essential HTN   Vaginal bleeding, abnormal   Goals of care, counseling/discussion   Acute encephalopathy:  Appears to have improved. She is alert and comfortable.  MRI brain showed. Larger patchy 1-3 centimeter scattered infarcts demonstrated today in both cerebral and cerebellar hemispheres.    H/o PE:  Was on apixiban which was held for vaginal bleeding.    H/o endometrial carcinoma with active vaginal bleeding.  Family does not want to do any blood transfusions or any consults for work up.  Family wants to focus on comfort .   Elevated troponin: since family does not any work up done. Cardiology consult was cancelled.    Hypertension Well controlled.    Disposition:  Palliative care consulted, and discussed with family. Recommend residential hospice vs SNF with palliative care services. Social work consult to initiate the process.   DVT prophylaxis: scd's Code Status:DNR Family Communication: family at bedside.  Disposition Plan: pending therapy  evaluation.    Consultants:   Palliative care.   Neurology.   Procedures: None.  Antimicrobials: None.   Subjective: Family at bedside, reports that she is unable to feed herself and she is eating less than 25%. Will get nutrition consult.   Objective: Vitals:   02/06/18 0007 02/06/18 0359 02/06/18 2102 02/07/18 0549  BP: (!) 117/53 97/71 (!) 121/48 (!) 130/50  Pulse: 92 89  91  Resp: 19 19    Temp: 98.7 F (37.1 C) 98.5 F (36.9 C) 98 F (36.7 C)   TempSrc: Oral Oral Oral   SpO2: 97% 98%  95%  Weight:      Height:        Intake/Output Summary (Last 24 hours) at 02/07/2018 1240 Last data filed at 02/07/2018 0212 Gross per 24 hour  Intake 160 ml  Output -  Net 160 ml   Filed Weights   02/05/18 0026 02/05/18 0516  Weight: 62.1 kg (137 lb) 63.9 kg (140 lb 14 oz)    Examination:  General exam: Appears calm and comfortable not in distress.  Respiratory system: Clear to auscultation. Respiratory effort normal.no wheezing or rhonchi.  Cardiovascular system: S1 & S2 heard, RRR. No pedal edema. Gastrointestinal system: Abdomen is soft non tender non distended bowel sounds good.  Central nervous system: Alert , able to answer simple questions with  yes or no. Right hemiparesis.  Extremities: trace edema.  Skin: No rashes, lesions or ulcers Psychiatry:  Mood & affect appropriate.     Data Reviewed: I have personally reviewed following labs and imaging studies  CBC: Recent Labs  Lab 02/02/18 0505 02/05/18 0045 02/05/18 0058 02/05/18 5093 02/05/18  0805  WBC 7.8 11.2*  --  9.1 8.5  NEUTROABS  --  8.7*  --   --   --   HGB 10.5* 10.0* 9.9* 8.7* 8.5*  HCT 33.0* 32.0* 29.0* 27.0* 27.0*  MCV 94.3 96.7  --  93.4 94.4  PLT 197 210  --  180 924   Basic Metabolic Panel: Recent Labs  Lab 02/02/18 0505 02/03/18 0555 02/04/18 0544 02/05/18 0045 02/05/18 0058 02/05/18 0805  NA 141 137 135 140 138 139  K 4.6 4.7 4.3 4.8 4.7 4.4  CL 114* 113* 111 114* 111 117*  CO2  18* 18* 17* 14*  --  14*  GLUCOSE 107* 109* 95 155* 151* 115*  BUN 40* 40* 27* 38* 40* 33*  CREATININE 1.16* 1.16* 1.00 1.63* 1.60* 1.27*  CALCIUM 8.9 8.7* 8.2* 8.8*  --  8.4*   GFR: Estimated Creatinine Clearance: 26.9 mL/min (A) (by C-G formula based on SCr of 1.27 mg/dL (H)). Liver Function Tests: Recent Labs  Lab 02/05/18 0045 02/05/18 0805  AST 30 28  ALT 21 19  ALKPHOS 113 97  BILITOT 0.6 0.5  PROT 6.2* 5.4*  ALBUMIN 3.3* 2.9*   No results for input(s): LIPASE, AMYLASE in the last 168 hours. No results for input(s): AMMONIA in the last 168 hours. Coagulation Profile: Recent Labs  Lab 02/05/18 0045 02/05/18 0337  INR 1.81 1.90   Cardiac Enzymes: Recent Labs  Lab 02/05/18 0045 02/05/18 0429 02/05/18 0611  TROPONINI 1.65* 1.58* 1.38*   BNP (last 3 results) No results for input(s): PROBNP in the last 8760 hours. HbA1C: No results for input(s): HGBA1C in the last 72 hours. CBG: Recent Labs  Lab 02/05/18 0216 02/05/18 0559  GLUCAP 134* 100*   Lipid Profile: No results for input(s): CHOL, HDL, LDLCALC, TRIG, CHOLHDL, LDLDIRECT in the last 72 hours. Thyroid Function Tests: No results for input(s): TSH, T4TOTAL, FREET4, T3FREE, THYROIDAB in the last 72 hours. Anemia Panel: No results for input(s): VITAMINB12, FOLATE, FERRITIN, TIBC, IRON, RETICCTPCT in the last 72 hours. Sepsis Labs: No results for input(s): PROCALCITON, LATICACIDVEN in the last 168 hours.  No results found for this or any previous visit (from the past 240 hour(s)).       Radiology Studies: No results found.      Scheduled Meds: .  stroke: mapping our early stages of recovery book   Does not apply Once  . baclofen  5 mg Oral TID  . LORazepam  0.25 mg Oral BID  . pantoprazole  40 mg Oral Daily  . sodium chloride flush  3 mL Intravenous Q12H   Continuous Infusions: . sodium chloride       LOS: 2 days    Time spent: 35 minutes.      Hosie Poisson, MD [facility] Pager [phone]   If 7PM-7AM, please contact night-coverage [email or web address] Password [email or web address] 02/07/2018, 12:40 PM

## 2018-02-08 NOTE — Progress Notes (Signed)
PROGRESS NOTE    Vicki Stein  PPI:[id number] DOB: 12-20-1927 DOA: 02/05/2018 PCP: Glenford Bayley, DO    Brief Narrative:  Vicki Stein a 84 y.o.femalewithhistory of recent admission for embolic stroke and pulmonary embolism on apixaban, endometrial carcinoma, recently discharged from [facility] was brought in by family for confusion. In the ER patient appears disoriented and CT head shows ischemic changes larger than the previous one. On-call neurologist was notified and at this time neurologist recommended MRI brain. But family/ patient's son and POA does not want any interventions at this time and want to focus on comfort alone.  Palliative care consulted, recommending residential hospice vs SNF with palliative care services.    Assessment & Plan:   Principal Problem:   Acute encephalopathy Active Problems:   Paraplegia, spastic congenital (HCC)   Pulmonary embolism (HCC)   Benign essential HTN   Vaginal bleeding, abnormal   Goals of care, counseling/discussion   Acute encephalopathy:  Appears to have improved. Today she is more somnolent, did not eat any breakfast and lunch. Family at bedside.  MRI brain showed. Larger patchy 1-3 centimeter scattered infarcts demonstrated today in both cerebral and cerebellar hemispheres.  Unclear if she had another stroke.    H/o PE:  Was on apixiban which was held for vaginal bleeding.    H/o endometrial carcinoma with active vaginal bleeding.  Family does not want to do any blood transfusions or any consults for work up.  Family wants to focus on comfort .   Elevated troponin: since family does not any work up done. Cardiology consult was cancelled.    Hypertension Well controlled.    Disposition:  Palliative care consulted, and discussed with family. Recommend residential hospice vs SNF with palliative care services. Social work consult to initiate the process.   Pt is not eating atleast 20% of her meals. Continues to decline.     DVT prophylaxis: scd's Code Status:DNR Family Communication: family at bedside.  Disposition Plan: pending residential hospice placement.    Consultants:   Palliative care.   Neurology.   Procedures: None.  Antimicrobials: None.   Subjective: Family at bedside, she did not eat breakfast or lunch. She is somnolent and just groaning.   Objective: Vitals:   02/07/18 0549 02/07/18 1816 02/07/18 1922 02/08/18 0907  BP: (!) 130/50 (!) 117/49 107/77 127/62  Pulse: 91 93 94   Resp:  19 20   Temp:  98.4 F (36.9 C) 98.5 F (36.9 C) 98.7 F (37.1 C)  TempSrc:  Oral Oral Oral  SpO2: 95% 99% 100%   Weight:      Height:       No intake or output data in the 24 hours ending 02/08/18 1605 Filed Weights   02/05/18 0026 02/05/18 0516  Weight: 62.1 kg (137 lb) 63.9 kg (140 lb 14 oz)    Examination:  General exam: somnolent.  Respiratory system: air entry fair , no wheezing heard.  Cardiovascular system: S1 & S2 heard, RRR.  Gastrointestinal system: Abdomen is soft non tender  Bowel sounds good.  Central nervous system:  Somnolent.  Extremities: trace edema.  Skin: No rashes, lesions or ulcers Psychiatry:  Couldn't be assessed. .     Data Reviewed: I have personally reviewed following labs and imaging studies  CBC: Recent Labs  Lab 02/02/18 0505 02/05/18 0045 02/05/18 0058 02/05/18 0611 02/05/18 0805  WBC 7.8 11.2*  --  9.1 8.5  NEUTROABS  --  8.7*  --   --   --  HGB 10.5* 10.0* 9.9* 8.7* 8.5*  HCT 33.0* 32.0* 29.0* 27.0* 27.0*  MCV 94.3 96.7  --  93.4 94.4  PLT 197 210  --  180 150   Basic Metabolic Panel: Recent Labs  Lab 02/02/18 0505 02/03/18 0555 02/04/18 0544 02/05/18 0045 02/05/18 0058 02/05/18 0805  NA 141 137 135 140 138 139  K 4.6 4.7 4.3 4.8 4.7 4.4  CL 114* 113* 111 114* 111 117*  CO2 18* 18* 17* 14*  --  14*  GLUCOSE 107* 109* 95 155* 151* 115*  BUN 40* 40* 27* 38* 40* 33*  CREATININE 1.16* 1.16* 1.00 1.63* 1.60* 1.27*  CALCIUM 8.9  8.7* 8.2* 8.8*  --  8.4*   GFR: Estimated Creatinine Clearance: 26.9 mL/min (A) (by C-G formula based on SCr of 1.27 mg/dL (H)). Liver Function Tests: Recent Labs  Lab 02/05/18 0045 02/05/18 0805  AST 30 28  ALT 21 19  ALKPHOS 113 97  BILITOT 0.6 0.5  PROT 6.2* 5.4*  ALBUMIN 3.3* 2.9*   No results for input(s): LIPASE, AMYLASE in the last 168 hours. No results for input(s): AMMONIA in the last 168 hours. Coagulation Profile: Recent Labs  Lab 02/05/18 0045 02/05/18 0337  INR 1.81 1.90   Cardiac Enzymes: Recent Labs  Lab 02/05/18 0045 02/05/18 0429 02/05/18 0611  TROPONINI 1.65* 1.58* 1.38*   BNP (last 3 results) No results for input(s): PROBNP in the last 8760 hours. HbA1C: No results for input(s): HGBA1C in the last 72 hours. CBG: Recent Labs  Lab 02/05/18 0216 02/05/18 0559  GLUCAP 134* 100*   Lipid Profile: No results for input(s): CHOL, HDL, LDLCALC, TRIG, CHOLHDL, LDLDIRECT in the last 72 hours. Thyroid Function Tests: No results for input(s): TSH, T4TOTAL, FREET4, T3FREE, THYROIDAB in the last 72 hours. Anemia Panel: No results for input(s): VITAMINB12, FOLATE, FERRITIN, TIBC, IRON, RETICCTPCT in the last 72 hours. Sepsis Labs: No results for input(s): PROCALCITON, LATICACIDVEN in the last 168 hours.  No results found for this or any previous visit (from the past 240 hour(s)).       Radiology Studies: No results found.      Scheduled Meds: .  stroke: mapping our early stages of recovery book   Does not apply Once  . baclofen  5 mg Oral TID  . LORazepam  0.25 mg Oral BID  . pantoprazole  40 mg Oral Daily  . sodium chloride flush  3 mL Intravenous Q12H   Continuous Infusions: . sodium chloride       LOS: 3 days    Time spent: 35 minutes.      Hosie Poisson, MD [facility] Pager [phone]   If 7PM-7AM, please contact night-coverage [email or web address] Password [email or web address] 02/08/2018, 4:05 PM

## 2018-02-08 NOTE — Progress Notes (Signed)
Patient had changes in mental status on assessment, unable to speak or hold eye contact. Could follow commands, left side normal/weak, right side very sluggish. Right arm unable to grasp but could make small movements. Patient's smile was also drooping. Spoke with charge nurse Truman Hayward. He said she has been having spells of this for a while and that family wishes to make her [place]. Note on the chart from MD states they are aware of her mental status fluctuations.   Will continue to monitor for any further changes.    Charlsie Quest

## 2018-02-09 DIAGNOSIS — N939 Abnormal uterine and vaginal bleeding, unspecified: Secondary | ICD-10-CM

## 2018-02-09 DIAGNOSIS — Z515 Encounter for palliative care: Secondary | ICD-10-CM

## 2018-02-09 MED ORDER — OXYCODONE HCL 5 MG/5ML PO SOLN: 5.0000 mg | ORAL | Status: DC | PRN

## 2018-02-09 MED ORDER — HALOPERIDOL LACTATE 2 MG/ML PO CONC: 2.0000 mg | Freq: Four times a day (QID) | ORAL | 0 refills | Status: AC | PRN

## 2018-02-09 MED ORDER — OXYCODONE HCL 5 MG/5ML PO SOLN: 5.0000 mg | ORAL | 0 refills | Status: AC | PRN

## 2018-02-09 MED ORDER — LORAZEPAM 0.5 MG PO TABS: 0.2500 mg | ORAL_TABLET | Freq: Two times a day (BID) | ORAL | 0 refills | Status: AC

## 2018-02-09 NOTE — Care Management Important Message (Signed)
Important Message  Patient Details  Name: Vicki Stein MRN: [id number] Date of Birth: 26-May-1928   Medicare Important Message Given:  Yes    Shenika Quint P Nitza Schmid 02/09/2018, 3:42 PM

## 2018-02-09 NOTE — Discharge Summary (Signed)
Physician Discharge Summary  Vicki Stein:[id number] DOB: [birth year] DOA: 02/05/2018  PCP: Glenford Bayley, DO  Admit date: 02/05/2018 Discharge date: 02/09/2018  Admitted From: Home.  Disposition: Residential Hospice  Recommendations for Outpatient Follow-up:  Follow up with hospice MD as recommended.   Discharge Condition:HOspice.  CODE STATUS:comfort care Diet recommendation: comfort feeds.  Brief/Interim Summary: Vicki Mantei Perdueis a 84 y.o.femalewithhistory of recent admission for embolic stroke and pulmonary embolism on apixaban, endometrial carcinoma, recently discharged from [facility] was brought in by family for confusion. In the ER patient appears disoriented and CT head shows ischemic changes larger than the previous one. On-call neurologist was notified and at this time neurologist recommended MRI brain. But family/ patient's son and POA does not want any interventions at this time and want to focus on comfort alone.  Palliative care consulted, recommending residential hospice     Discharge Diagnoses:  Principal Problem:   Acute encephalopathy Active Problems:   Paraplegia, spastic congenital (HCC)   Pulmonary embolism (HCC)   Benign essential HTN   Vaginal bleeding, abnormal   Goals of care, counseling/discussion   Palliative care by specialist  Acute encephalopathy:  Appears to have improved. Today she is more somnolent, did not eat any breakfast and lunch. Family at bedside.  MRI brain showed. Larger patchy 1-3 centimeter scattered infarcts demonstrated today in both cerebral and cerebellar hemispheres.  Unclear if she had another stroke. she continues to decline, has dysphagia, not eating and not able to use her right arm.    H/o PE:  Was on apixiban which was held for vaginal bleeding.    H/o endometrial carcinoma with active vaginal bleeding.  Family does not want to do any blood transfusions or any consults for work up.  Family wants to focus on comfort .    Elevated troponin: since family does not any work up done. Cardiology consult was cancelled.    Hypertension Well controlled.    Disposition:  Palliative care consulted, and discussed with family. Recommend residential hospice. Social work consult on board   Pt is not eating atleast 20% of her meals. Continues to decline.      Discharge Instructions  Discharge Instructions    Diet - low sodium heart healthy   Complete by:  As directed    Discharge instructions   Complete by:  As directed    Follow up with Hospice MD as recommended.     Allergies as of 02/09/2018      Reactions   Aleve [naproxen Sodium] Itching, Swelling   Bactrim [sulfamethoxazole-trimethoprim] Itching, Other (See Comments)   Motrin [ibuprofen] Itching, Swelling   Penicillins Hives, Itching, Other (See Comments)   Has patient had a PCN reaction causing immediate rash, facial/tongue/throat swelling, SOB or lightheadedness with hypotension: No Has patient had a PCN reaction causing severe rash involving mucus membranes or skin necrosis: No Has patient had a PCN reaction that required hospitalization No Has patient had a PCN reaction occurring within the last 10 years: No If all of the above answers are "NO", then may proceed with Cephalosporin use.   Ultram [tramadol] Itching, Swelling, Other (See Comments)    tongue swelling      Medication List    STOP taking these medications   amLODipine 2.5 MG tablet Commonly known as:  NORVASC   apixaban 5 MG Tabs tablet Commonly known as:  ELIQUIS   docusate sodium 100 MG capsule Commonly known as:  COLACE   fluticasone 50 MCG/ACT nasal spray Commonly  known as:  FLONASE   lisinopril 40 MG tablet Commonly known as:  PRINIVIL,ZESTRIL   MUSCLE RUB 10-15 % Crea   omeprazole 40 MG capsule Commonly known as:  PRILOSEC     TAKE these medications   acetaminophen 500 MG tablet Commonly known as:  TYLENOL Take 500 mg by mouth every 6 (six) hours  as needed for mild pain or moderate pain.   atorvastatin 40 MG tablet Commonly known as:  LIPITOR Take 1 tablet (40 mg total) by mouth every evening.   Baclofen 5 MG Tabs Take 5 mg by mouth 3 (three) times daily.   haloperidol 2 MG/ML solution Commonly known as:  HALDOL Place 1 mL (2 mg total) under the tongue every 6 (six) hours as needed for agitation (or delirium).   LORazepam 0.5 MG tablet Commonly known as:  ATIVAN Take 0.5 tablets (0.25 mg total) by mouth 2 (two) times daily.   meclizine 25 MG tablet Commonly known as:  ANTIVERT Take 1 tablet (25 mg total) by mouth 2 (two) times daily as needed for dizziness.   megestrol 40 MG tablet Commonly known as:  MEGACE Take 1 tablet (40 mg total) by mouth 3 (three) times daily.   oxyCODONE 5 MG/5ML solution Commonly known as:  ROXICODONE Take 5 mLs (5 mg total) by mouth every 3 (three) hours as needed for moderate pain or severe pain (dyspnea/air hunger).       Allergies  Allergen Reactions  . Aleve [Naproxen Sodium] Itching and Swelling  . Bactrim [Sulfamethoxazole-Trimethoprim] Itching and Other (See Comments)  . Motrin [Ibuprofen] Itching and Swelling  . Penicillins Hives, Itching and Other (See Comments)    Has patient had a PCN reaction causing immediate rash, facial/tongue/throat swelling, SOB or lightheadedness with hypotension: No Has patient had a PCN reaction causing severe rash involving mucus membranes or skin necrosis: No Has patient had a PCN reaction that required hospitalization No Has patient had a PCN reaction occurring within the last 10 years: No If all of the above answers are "NO", then may proceed with Cephalosporin use.  Marland Kitchen Ultram [Tramadol] Itching, Swelling and Other (See Comments)     tongue swelling    Consultations:  Neurology  Palliative care    Procedures/Studies: Dg Chest 1 View  Result Date: 01/24/2018 CLINICAL DATA:  blurred vision. States started on new chemo medication 1 week  ago, saw PCP on Friday and treated for vertigo. Pt reports numbness and weakness to bilateral arms intermittently. Pt reports no improvement to blurred vision despite treatment for vertigo, doesn't feel head "is clear." EXAM: CHEST  1 VIEW COMPARISON:  09/03/2015 FINDINGS: Lungs are clear. Heart size upper limits normal. Aortic Atherosclerosis (ICD10-170.0). No effusion. Vertebral endplate spurring at multiple levels in the mid and lower thoracic spine. IMPRESSION: No acute cardiopulmonary disease. Electronically Signed   By: Lucrezia Europe M.D.   On: 01/24/2018 13:48   Dg Chest 2 View  Result Date: 02/05/2018 CLINICAL DATA:  Altered mental status EXAM: CHEST - 2 VIEW COMPARISON:  CTA chest dated 01/24/2018 FINDINGS: Lungs are clear.  No pleural effusion or pneumothorax. The heart is normal in size. Degenerative changes of the bilateral shoulders and thoracic spine. IMPRESSION: No evidence of acute cardiopulmonary disease. Electronically Signed   By: Julian Hy M.D.   On: 02/05/2018 01:27   Ct Head Wo Contrast  Result Date: 02/05/2018 CLINICAL DATA:  Altered mental status. Focal neuro deficit, > 6 hrs, stroke suspected EXAM: CT HEAD WITHOUT CONTRAST TECHNIQUE: Contiguous  axial images were obtained from the base of the skull through the vertex without intravenous contrast. COMPARISON:  Head CT and brain MRI 11 days ago 01/24/2018 FINDINGS: Brain: Small area of cortical and subcortical low-density in the posterior right occipital and posterior left parietooccipital lobes, not definitively seen on prior exam, and not definitively corresponding to small watershed ischemia on prior MRI. Additional area of subcortical low-density in the left frontal lobe, in the area of small punctate infarcts on prior MRI. No intracranial hemorrhage. Remote left cerebellar infarct. Background chronic small vessel ischemia is unchanged. No hydrocephalus, the basilar cisterns are patent. Vascular: Atherosclerosis of skullbase  vasculature without hyperdense vessel or abnormal calcification. Skull: Frontal hyperostosis, unchanged.  No acute finding. Sinuses/Orbits: Bilateral cataract resection.  No acute abnormality. Other: None. IMPRESSION: 1. Findings concerning for multifocal subacute ischemia involving the right occipital, left parietooccipital, and left frontal lobe. These areas of ischemia are larger than that seen on brain MRI 10 days ago when there were multiple punctate acute infarcts. Consider MRI follow-up. 2. No hemorrhage.  Stable degree of chronic small vessel ischemia. Electronically Signed   By: Jeb Levering M.D.   On: 02/05/2018 02:31   Ct Head Wo Contrast  Result Date: 01/24/2018 CLINICAL DATA:  Blurred vision and vertigo 2 days. Intermittent bilateral arm numbness and weakness. EXAM: CT HEAD WITHOUT CONTRAST TECHNIQUE: Contiguous axial images were obtained from the base of the skull through the vertex without intravenous contrast. COMPARISON:  None. FINDINGS: Brain: Ventricles, cisterns and other CSF spaces are normal. There is no mass, mass effect, shift of midline structures or acute hemorrhage. There is mild chronic ischemic microvascular disease. No evidence of acute infarction. Vascular: Significant calcified plaque over the right vertebral artery. Skull: Normal. Negative for fracture or focal lesion. Sinuses/Orbits: No acute finding. Other: None. IMPRESSION: No acute findings. Mild chronic ischemic microvascular disease. Electronically Signed   By: Marin Olp M.D.   On: 01/24/2018 13:31   Ct Angio Chest Pe W And/or Wo Contrast  Result Date: 01/24/2018 CLINICAL DATA:  Chest pain, blurred vision, vertigo history of endometrial adenocarcinoma EXAM: CT ANGIOGRAPHY CHEST WITH CONTRAST TECHNIQUE: Multidetector CT imaging of the chest was performed using the standard protocol during bolus administration of intravenous contrast. Multiplanar CT image reconstructions and MIPs were obtained to evaluate the  vascular anatomy. CONTRAST:  123mL ISOVUE-370 IOPAMIDOL (ISOVUE-370) INJECTION 76% COMPARISON:  01/24/2018 chest x-ray FINDINGS: Cardiovascular: Pulmonary arteries are well visualized. Small hypodense filling defect within a right lower lobe medial segmental branch, images 168 through 180 consistent with a small right lower lobe pulmonary embolus. No significant saddle or central hilar embolus appreciated. RV to LV ratio is 0.86. Negative for heart strain. Aortic atherosclerosis noted. Negative for aneurysm. Native coronary atherosclerosis noted. Normal heart size. Small pericardial effusion present. Mediastinum/Nodes: No enlarged mediastinal, hilar, or axillary lymph nodes. Thyroid gland, trachea, and esophagus demonstrate no significant findings. Lungs/Pleura: Minor basilar atelectasis. No significant focal pneumonia, collapse or consolidation. Negative for edema, effusion, or pneumothorax. Trachea central airways are patent. No pleural abnormality. Upper Abdomen: Remote cholecystectomy. Mild biliary prominence, suspect postoperative. Abdominal atherosclerosis. Degenerative changes of the spine. No acute upper abdominal finding. Musculoskeletal: Degenerative changes of the spine with large thoracic osteophytes throughout. No acute compression fracture. Bones are osteopenic. Sternum intact. Review of the MIP images confirms the above findings. IMPRESSION: Small right lower lobe segmental pulmonary embolus peripherally within a medial right lower lobe segmental artery. Very low thrombus burden. No significant central hilar or proximal pulmonary  embolus. Negative for heart strain. Small pericardial effusion, nonspecific. Native coronary atherosclerosis Low lung volumes with bibasilar atelectasis. Aortic Atherosclerosis (ICD10-I70.0). Electronically Signed   By: Jerilynn Mages.  Shick M.D.   On: 01/24/2018 14:23   Mr Brain Wo Contrast  Result Date: 02/05/2018 CLINICAL DATA:  84 year old female diagnosed with numerous scattered  small cerebral infarcts on 01/24/2018. Altered mental status. EXAM: MRI HEAD WITHOUT CONTRAST TECHNIQUE: Multiplanar, multiecho pulse sequences of the brain and surrounding structures were obtained without intravenous contrast. COMPARISON:  Head CT 0206 hours today. Brain MRI 01/24/2018, intracranial MRA 01/25/2018. FINDINGS: Brain: Study is intermittently degraded by motion artifact despite repeated imaging attempts. Motion artifact on diffusion-weighted imaging today is such that small acute infarcts, such as those demonstrated on 01/24/2018, could be missed. Today, multifocal patchy acute infarcts on the range of 1-3 centimeters in size are demonstrated in the bilateral anterior and posterior circulation. The bilateral cerebellum, bilateral occipital poles, right parietal lobe, and left greater than right frontal lobes are affected. Many of the same areas demonstrated punctate infarcts on 01/24/2018. Associated cytotoxic edema with T2 and FLAIR hyperintensity which has increased in the affected areas. No associated mass effect. No associated hemorrhage identified. Today the deep gray matter nuclei and brainstem are relatively spared. The No midline shift, mass effect, evidence of mass lesion, ventriculomegaly, extra-axial collection or acute intracranial hemorrhage. Cervicomedullary junction and pituitary are within normal limits. Vascular: Major intracranial vascular flow voids are stable with mild intracranial artery tortuosity. Skull and upper cervical spine: The stable visible cervical spine degeneration. Background bone marrow signal appears normal. Benign appearing bony exostosis at the left vertex re-demonstrated. Sinuses/Orbits: Stable and negative. Other: Mastoid air cells remain clear. Visible internal auditory structures appear normal. Stable scalp and face soft tissues. IMPRESSION: 1. Continued bilateral anterior and posterior circulation ischemia. This suggests ongoing embolic events from the heart  or proximal aorta. 2. Larger patchy 1-3 centimeter scattered infarcts demonstrated today in both cerebral and cerebellar hemispheres. No associated hemorrhage or mass effect. 3. Motion artifact on today's study such that small infarcts - like those demonstrated on 01/24/2018 - would be obscured. Electronically Signed   By: Genevie Ann M.D.   On: 02/05/2018 08:14   Mr Brain W And Wo Contrast  Result Date: 01/24/2018 CLINICAL DATA:  Vertigo. Blurred vision. Intermittent weakness and numbness in both arms. History of endometrial cancer. EXAM: MRI HEAD WITHOUT AND WITH CONTRAST TECHNIQUE: Multiplanar, multiecho pulse sequences of the brain and surrounding structures were obtained without and with intravenous contrast. CONTRAST:  80mL MULTIHANCE GADOBENATE DIMEGLUMINE 529 MG/ML IV SOLN COMPARISON:  Head CT 01/24/2018 and MRI 06/28/2003. FINDINGS: Brain: There are numerous small foci of acute to early subacute infarction throughout both cerebral and cerebellar hemispheres in a largely symmetric fashion. There is cortical involvement in the frontal and parietal lobes bilaterally as well as some involvement of the centrum semiovale. There is milder involvement of both occipital lobes. Two small infarcts asymmetrically involve the right caudate and corona radiata. Some of the infarcts enhance. No intracranial hemorrhage, mass, midline shift, or extra-axial fluid collection is identified. There are small chronic infarcts bilaterally in the cerebellum and in the left caudate and left lateral periventricular white matter. Tiny chronic lacunar infarcts or perivascular spaces are noted in the pons. Patchy T2 hyperintensities in the cerebral white matter bilaterally are nonspecific but compatible with mild-to-moderate chronic small vessel ischemic disease, progressed from 2004. A cavum velum interpositum is incidentally noted, a normal variant. Vascular: Major intracranial vascular flow voids  are preserved. Skull and upper  cervical spine: Unremarkable bone marrow signal. Sinuses/Orbits: Bilateral cataract extraction. Paranasal sinuses and mastoid air cells are clear. Other: None. IMPRESSION: 1. Small acute to early subacute infarcts scattered throughout both cerebral and cerebellar hemispheres, possibly watershed ischemia given distribution and relative symmetry. Query hypoperfusion event. 2. Mild-to-moderate chronic small vessel ischemic disease. Chronic cerebellar infarcts. Electronically Signed   By: Logan Bores M.D.   On: 01/24/2018 16:17   Mr Jodene Nam Head Wo Contrast  Result Date: 01/25/2018 CLINICAL DATA:  Follow-up stroke. Bilateral cerebral and cerebellar infarcts on MRI. EXAM: MRA HEAD WITHOUT CONTRAST TECHNIQUE: Angiographic images of the Circle of Willis were obtained using MRA technique without intravenous contrast. COMPARISON:  Head MRA 06/28/2003 FINDINGS: The visualized distal vertebral arteries are patent to the basilar with the right being dominant. There is mild left greater than right V4 segment irregularity without flow limiting stenosis. The basilar artery is widely patent, possibly with a small fenestration proximally. Patent bilateral PICA, right AICA, and bilateral SCA origins are visualized. There is a patent right posterior communicating artery. Both PCAs are patent without evidence of significant stenosis. The internal carotid arteries are patent from skull base to carotid termini without evidence of significant stenosis. The ACAs and MCAs are patent without evidence of proximal branch occlusion or significant proximal stenosis. No aneurysm is identified. IMPRESSION: Negative head MRA. Electronically Signed   By: Logan Bores M.D.   On: 01/25/2018 07:49       Subjective: Not in distress.   Discharge Exam: Vitals:   02/08/18 1827 02/09/18 0605  BP: 128/63 126/62  Pulse: 97 (!) 120  Resp: (!) 21   Temp: 100 F (37.8 C) 97.9 F (36.6 C)  SpO2: 99% 97%   Vitals:   02/07/18 1922 02/08/18  0907 02/08/18 1827 02/09/18 0605  BP: 107/77 127/62 128/63 126/62  Pulse: 94  97 (!) 120  Resp: 20  (!) 21   Temp: 98.5 F (36.9 C) 98.7 F (37.1 C) 100 F (37.8 C) 97.9 F (36.6 C)  TempSrc: Oral Oral Oral Oral  SpO2: 100%  99% 97%  Weight:      Height:        General: Pt is alert, awake, not in acute distress Cardiovascular: RRR, S1/S2 +,  Respiratory: CTA bilaterally, no wheezing, no rhonchi Abdominal: Soft, NT, ND, bowel sounds + Extremities: no edema, no cyanosis    The results of significant diagnostics from this hospitalization (including imaging, microbiology, ancillary and laboratory) are listed below for reference.     Microbiology: No results found for this or any previous visit (from the past 240 hour(s)).   Labs: BNP (last 3 results) No results for input(s): BNP in the last 8760 hours. Basic Metabolic Panel: Recent Labs  Lab 02/03/18 0555 02/04/18 0544 02/05/18 0045 02/05/18 0058 02/05/18 0805  NA 137 135 140 138 139  K 4.7 4.3 4.8 4.7 4.4  CL 113* 111 114* 111 117*  CO2 18* 17* 14*  --  14*  GLUCOSE 109* 95 155* 151* 115*  BUN 40* 27* 38* 40* 33*  CREATININE 1.16* 1.00 1.63* 1.60* 1.27*  CALCIUM 8.7* 8.2* 8.8*  --  8.4*   Liver Function Tests: Recent Labs  Lab 02/05/18 0045 02/05/18 0805  AST 30 28  ALT 21 19  ALKPHOS 113 97  BILITOT 0.6 0.5  PROT 6.2* 5.4*  ALBUMIN 3.3* 2.9*   No results for input(s): LIPASE, AMYLASE in the last 168 hours. No results for input(s):  AMMONIA in the last 168 hours. CBC: Recent Labs  Lab 02/05/18 0045 02/05/18 0058 02/05/18 0611 02/05/18 0805  WBC 11.2*  --  9.1 8.5  NEUTROABS 8.7*  --   --   --   HGB 10.0* 9.9* 8.7* 8.5*  HCT 32.0* 29.0* 27.0* 27.0*  MCV 96.7  --  93.4 94.4  PLT 210  --  180 174   Cardiac Enzymes: Recent Labs  Lab 02/05/18 0045 02/05/18 0429 02/05/18 0611  TROPONINI 1.65* 1.58* 1.38*   BNP: Invalid input(s): POCBNP CBG: Recent Labs  Lab 02/05/18 0216 02/05/18 0559   GLUCAP 134* 100*   D-Dimer No results for input(s): DDIMER in the last 72 hours. Hgb A1c No results for input(s): HGBA1C in the last 72 hours. Lipid Profile No results for input(s): CHOL, HDL, LDLCALC, TRIG, CHOLHDL, LDLDIRECT in the last 72 hours. Thyroid function studies No results for input(s): TSH, T4TOTAL, T3FREE, THYROIDAB in the last 72 hours.  Invalid input(s): FREET3 Anemia work up No results for input(s): VITAMINB12, FOLATE, FERRITIN, TIBC, IRON, RETICCTPCT in the last 72 hours. Urinalysis    Component Value Date/Time   COLORURINE YELLOW 10/22/2015 0056   APPEARANCEUR CLEAR 10/22/2015 0056   LABSPEC 1.019 10/22/2015 0056   PHURINE 6.0 10/22/2015 0056   GLUCOSEU NEGATIVE 10/22/2015 0056   HGBUR TRACE (A) 10/22/2015 0056   BILIRUBINUR NEGATIVE 10/22/2015 0056   KETONESUR NEGATIVE 10/22/2015 0056   PROTEINUR NEGATIVE 10/22/2015 0056   UROBILINOGEN 1.0 03/13/2009 1447   NITRITE NEGATIVE 10/22/2015 0056   LEUKOCYTESUR NEGATIVE 10/22/2015 0056   Sepsis Labs Invalid input(s): PROCALCITONIN,  WBC,  LACTICIDVEN Microbiology No results found for this or any previous visit (from the past 240 hour(s)).   Time coordinating discharge: 35 minutes  SIGNED:   Hosie Poisson, MD  [facility] 02/09/2018, 1:51 PM Pager   If 7PM-7AM, please contact night-coverage [email or web address] Password [email or web address]

## 2018-02-09 NOTE — Progress Notes (Signed)
Clinical Social Worker facilitated patient discharge including contacting patient family and facility to confirm patient discharge plans.  Clinical information faxed to facility and family agreeable with plan.  CSW arranged ambulance transport via [facility] to [facility] .  RN to call [phone] report prior to discharge.  Clinical Social Worker will sign off for now as social work intervention is no longer needed. Please consult us again if new need arises.  Storm Sovine, MSW, LCSWA [phone]  

## 2018-02-09 NOTE — Progress Notes (Signed)
Daily Progress Note   Patient Name: Vicki Stein       Date: 02/09/2018 DOB: [birth year]  Age: 84 y.o. MRN#: [id number] Attending Physician: Hosie Poisson, MD Primary Care Physician: Glenford Bayley, DO Admit Date: 02/05/2018  Reason for Consultation/Follow-up: Establishing goals of care  Subjective: Patient drowsy but will wake to voice. Denies pain or discomfort. Minimally participating in conversation. No s/s of distress.  GOC:  Son, [name], daughter-in-law, and pastor at bedside. [name] speaks of her change in mental status over the weekend including difficulty swallowing and incomprehensible speech. He understands she may have had another stroke. He confirms focus on keeping his mother comfortable. Educated on EOL expectations and utilizing medications as needed to ensure comfort and relief from suffering. Discussed discharge options and [name] agrees that pursuing hospice facility will best care for her at EOL. He is requesting hospice facility in [place]. Answered questions and concerns. PMT contact information given.   Length of Stay: 4  Current Medications: Scheduled Meds:  .  stroke: mapping our early stages of recovery book   Does not apply Once  . baclofen  5 mg Oral TID  . LORazepam  0.25 mg Oral BID  . sodium chloride flush  3 mL Intravenous Q12H    Continuous Infusions: . sodium chloride      PRN Meds: sodium chloride, acetaminophen **OR** acetaminophen (TYLENOL) oral liquid 160 mg/5 mL **OR** acetaminophen, haloperidol **OR** haloperidol **OR** haloperidol lactate, hydrALAZINE, LORazepam **OR** LORazepam **OR** LORazepam, meclizine, sodium chloride flush  Physical Exam  Constitutional: She is easily aroused. She appears ill.  HENT:  Head: Normocephalic and atraumatic.    Cardiovascular: Regular rhythm.  Pulmonary/Chest: No accessory muscle usage. No tachypnea. No respiratory distress.  Neurological: She is easily aroused.  disoriented  Skin: Skin is warm and dry. There is pallor.  Psychiatric: Her speech is slurred. Cognition and memory are impaired. She is inattentive.  Nursing note and vitals reviewed.          Vital Signs: BP 126/62 (BP Location: Left Arm)   Pulse (!) 120   Temp 97.9 F (36.6 C) (Oral)   Resp (!) 21   Ht 5\' 2"  (1.575 m)   Wt 63.9 kg (140 lb 14 oz)   SpO2 97%   BMI 25.77 kg/m  SpO2: SpO2: 97 % O2  Device: O2 Device: Room Air O2 Flow Rate:    Intake/output summary:   Intake/Output Summary (Last 24 hours) at 02/09/2018 1242 Last data filed at 02/09/2018 0844 Gross per 24 hour  Intake 120 ml  Output -  Net 120 ml   LBM: Last BM Date: 02/06/18 Baseline Weight: Weight: 62.1 kg (137 lb) Most recent weight: Weight: 63.9 kg (140 lb 14 oz)       Palliative Assessment/Data: PPS 20%     Patient Active Problem List   Diagnosis Date Noted  . Acute encephalopathy 02/05/2018  . Goals of care, counseling/discussion   . Vaginal bleeding, abnormal 02/03/2018  . Endometrial cancer ([place])   . Chronic pain syndrome   . Anemia of chronic disease   . Embolic stroke ([place]) 72/53/6644  . Benign essential HTN   . Hearing loss   . Prediabetes   . Acute blood loss anemia   . Cerebral embolism with cerebral infarction 01/25/2018  . CVA (cerebral vascular accident) ([place]) 01/25/2018  . Generalized weakness   . Blurred vision, bilateral   . Elevated troponin   . Endometrial adenocarcinoma ([place]) 01/24/2018  . Coronary artery disease 01/24/2018  . Cerebrovascular disease in cancer patient ([place]) 01/24/2018  . Pulmonary embolism ([place]) 01/24/2018  . Chest pain 03/25/2013  . Dyslipidemia 03/25/2013  . HTN (hypertension) 03/25/2013  . Gout 03/25/2013  . Unstable angina ([place]) 03/25/2013  . 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  Paraplegia, spastic congenital [facility])     Palliative Care Assessment & Plan   Patient Profile: 84 y.o. female  with past medical history of spastic paraplegia (childhood disease), cerebral vascular disease, PVD, back pain, and grade 2 endometrial cancer on Megace (dx in 06/2017) who was admitted on 02/05/2018 with altered mental status. Patient had a recent admission (discharged 7/23) for PE and CVA.  She was discharged on anticoagulation.  She completed CIR on 7/31 and was discharged to her home.  She returned to the ER the evening of 7/31 with altered mental status.  Imaging shows additional CVAs.  She is also having vaginal bleeding.  Speech therapy evaluation indicated mild aspiration risk.  Patient can have a regular diet. Physical therapy evaluation indicated patient needed max assist to sit edge of bed and was unable to stand.  Assessment: Acute encephalopathy Endometrial carcinoma with active vaginal bleeding New infarcts Hx of recent CVA/PE Spastic congenital paraplegia  Recommendations/Plan:  DNR/DNI  Comfort focused care.   Continue prn medications to ensure comfort and relief from suffering.   SW following for hospice facility placement. Requesting [place] hospice facility.  Code Status: DNR/DNI   Code Status Orders  (From admission, onward)        Start     Ordered   02/05/18 1117  Do not attempt resuscitation (DNR)  Continuous    Question Answer Comment  In the event of cardiac or respiratory ARREST Do not call a "code blue"   In the event of cardiac or respiratory ARREST Do not perform Intubation, CPR, defibrillation or ACLS   In the event of cardiac or respiratory ARREST Use medication by any route, position, wound care, and other measures to relive pain and suffering. May use oxygen, suction and manual treatment of airway obstruction as needed for comfort.      02/05/18 1118    Code Status History    Date Active Date Inactive Code Status Order ID  Comments User Context   02/05/2018 0554 02/05/2018 1118 DNR [id number]  Rise Patience,  MD Inpatient   01/27/2018 1733 02/04/2018 1414 Full Code [id number]  Flora Lipps Inpatient   01/24/2018 1623 01/27/2018 1658 Full Code [id number]  Lady Deutscher, MD ED   03/25/2013 1715 03/26/2013 1614 Full Code [id number]  Robbie Lis, MD Inpatient   03/25/2013 1714 03/25/2013 1715 Full Code [id number]  Barrett, Evelene Croon, PA-C Inpatient    Advance Directive Documentation     Most Recent Value  Type of Advance Directive  Living will  Pre-existing out of facility DNR order (yellow form or pink MOST form)  -  "MOST" Form in Place?  -       Prognosis:   < 2 weeks: poor prognosis with endometrial cancer (not a surgical candidate), active vaginal bleeding, new infarcts and recent CVA/PE. No longer receiving anticoagulation. High risk for decompensation secondary to future strokes. Declining functional, cognitive, and nutritional status.   Discharge Planning:  Hospice facility   Care plan was discussed with patient, son [name]), DIL, Dr. Karleen Hampshire  Thank you for allowing the Palliative Medicine Team to assist in the care of this patient.   Time In: 1010 Time Out: 1040 Total Time 45min Prolonged Time Billed  no      Greater than 50%  of this time was spent counseling and coordinating care related to the above assessment and plan.  Ihor Dow, FNP-C Palliative Medicine Team  Phone: [phone] Fax: [phone]  Please contact Palliative Medicine Team phone at [phone] for questions and concerns.

## 2018-03-08 DEATH — deceased

## 2018-04-14 ENCOUNTER — Inpatient Hospital Stay: Payer: Medicare Other | Admitting: Gynecology

## 2019-04-28 ENCOUNTER — Telehealth: Payer: Self-pay

## 2019-04-28 NOTE — Telephone Encounter (Signed)
Called pt to set up OV with MS 10/23. Unable to reach pt. Voicemail not set up and home number not in service.

## 2019-05-11 IMAGING — CT CT HEAD W/O CM
4 of 5 series · 16 of 40 positions shown, 17 images · non-contrast
Comparison: Head CT and brain MRI 11 days ago 01/24/2018

CLINICAL DATA: Altered mental status. Focal neuro deficit, > 6 hrs,
stroke suspected

EXAM:
CT HEAD WITHOUT CONTRAST
TECHNIQUE: Contiguous axial images were obtained from the base of the skull
through the vertex without intravenous contrast.

[Series 4: head wo · axial · 0.40mm/px · z∈[+1471,+1555]mm · 3 of 35 slices shown, 4 images (1 of 2)]
[im 9/35  brain]
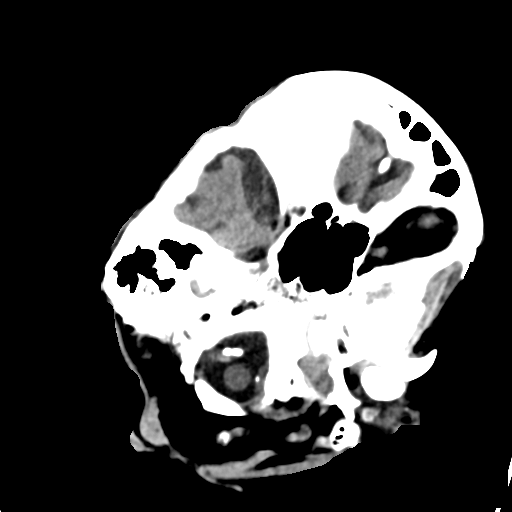
[im 9/35  bone]
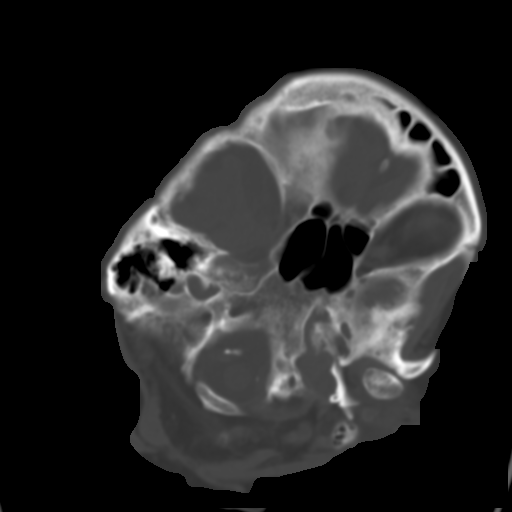
[im 18/35  brain]
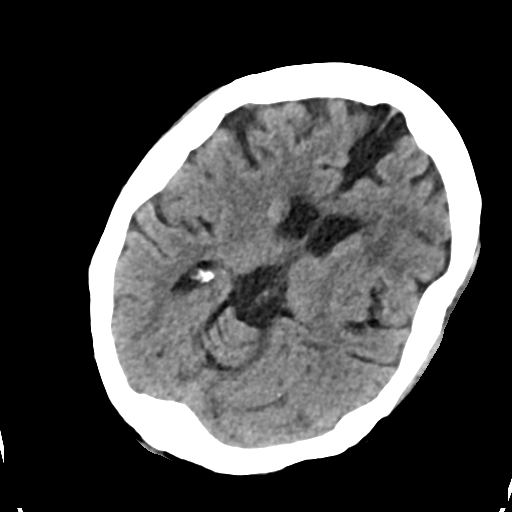
[im 26/35  brain]
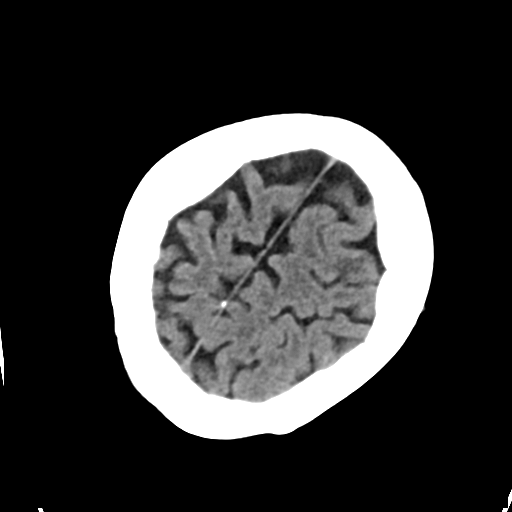

[Series 5: head bone · axial · 0.40mm/px · z∈[+1438,+1580]mm · 8 of 92 slices shown]
[im 10/92  bone]
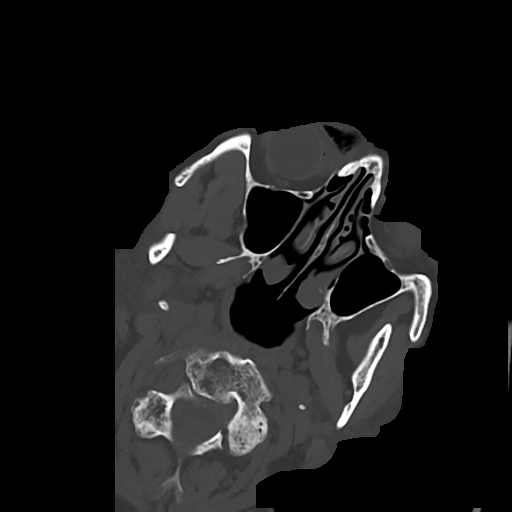
[im 19/92  bone]
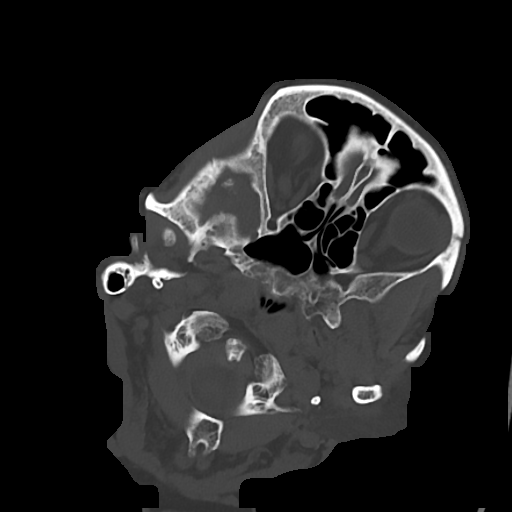
[im 28/92  bone]
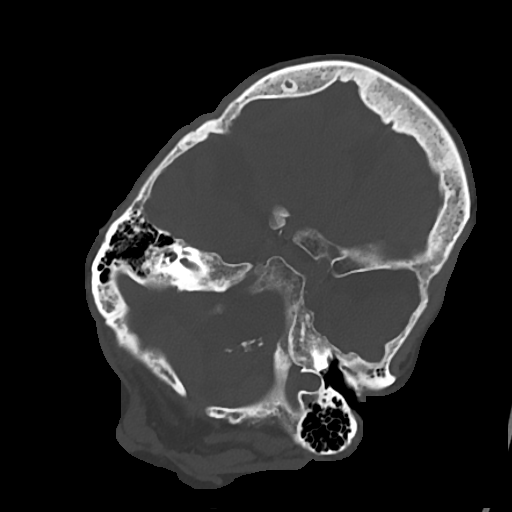
[im 37/92  bone]
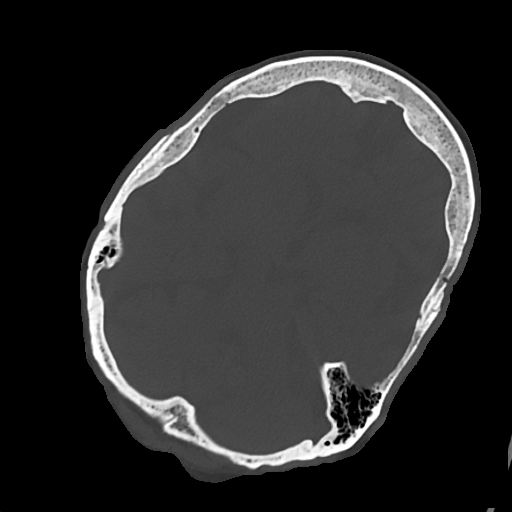
[im 55/92  bone]
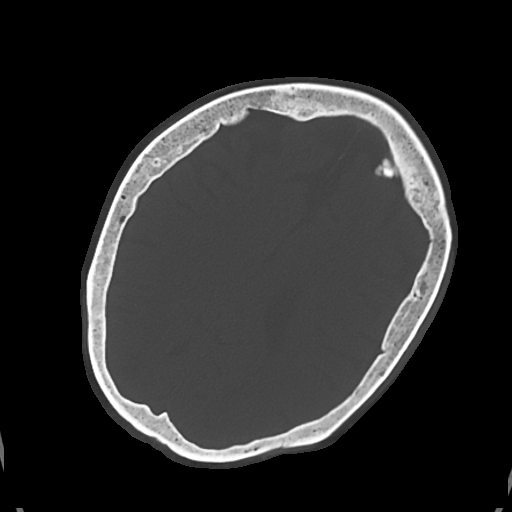
[im 64/92  bone]
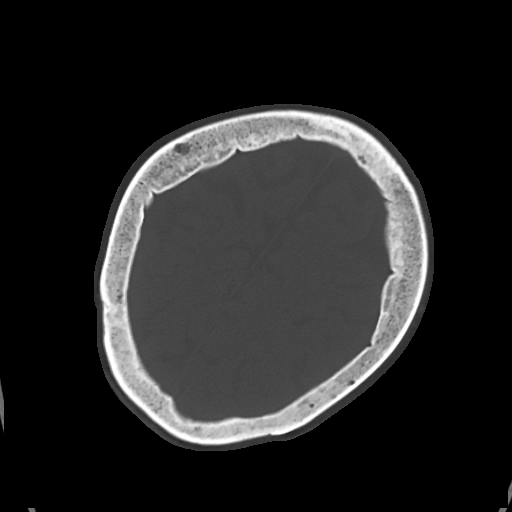
[im 73/92  bone]
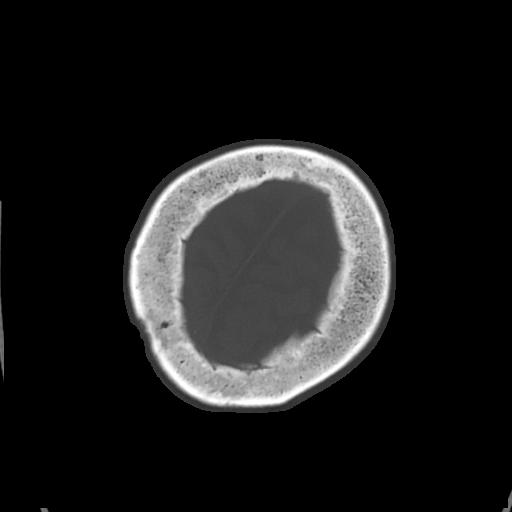
[im 82/92  bone]
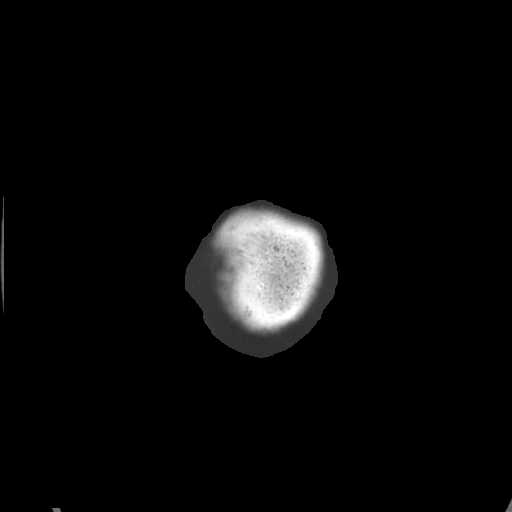

[Series 6: cor soft · coronal · 0.33mm/px · 3 of 69 slices shown]
[im 23/69  brain]
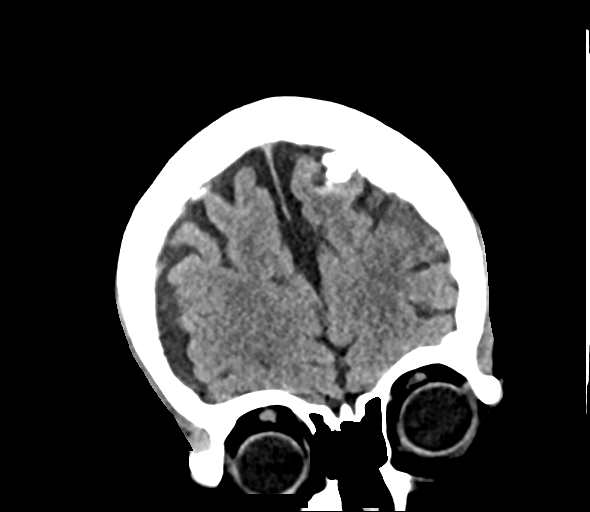
[im 31/69  brain]
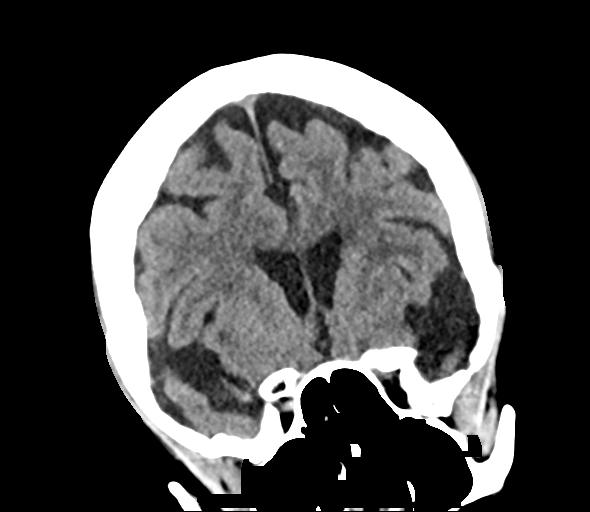
[im 38/69  brain]
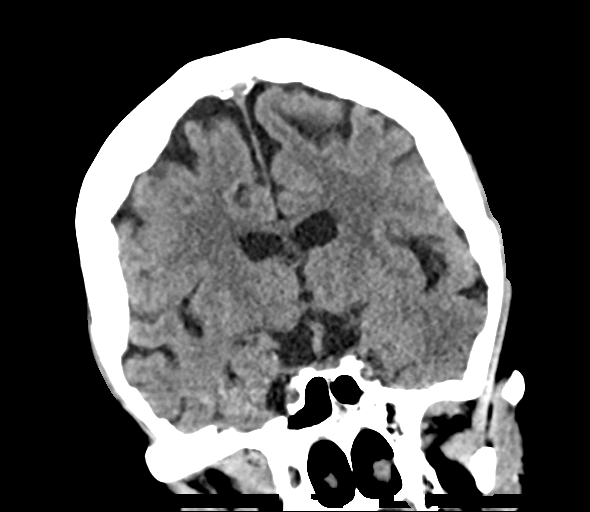

[Series 8: head wo · axial · 0.40mm/px · z∈[+1502,+1557]mm · 2 of 35 slices shown (2 of 2)]
[im 12/35  brain]
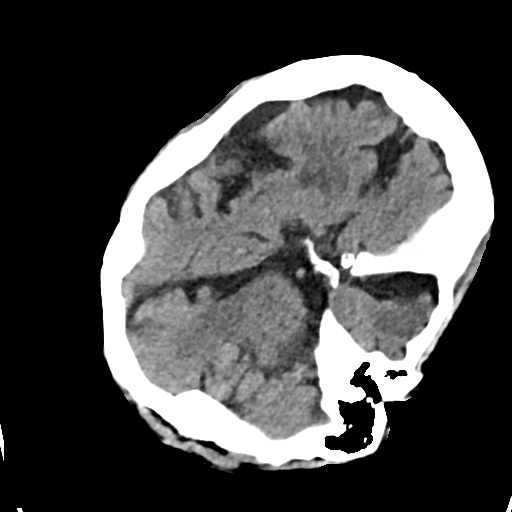
[im 23/35  brain]
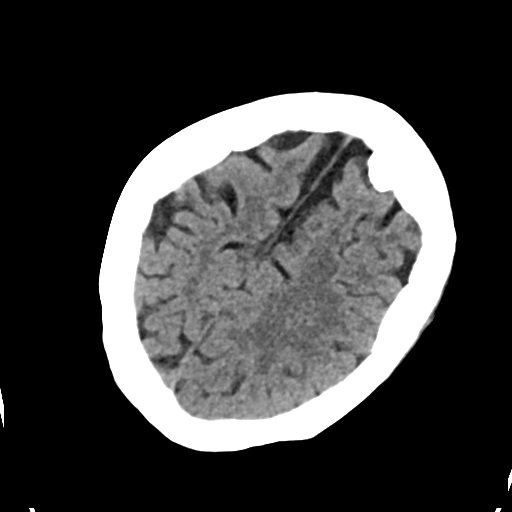

[16 of 40 positions shown; findings below may reference images not displayed]

FINDINGS: Brain: Small area of cortical and subcortical low-density in the
posterior right occipital and posterior left parietooccipital lobes,
not definitively seen on prior exam, and not definitively
corresponding to small watershed ischemia on prior MRI. Additional
area of subcortical low-density in the left frontal lobe, in the
area of small punctate infarcts on prior MRI. No intracranial
hemorrhage. Remote left cerebellar infarct. Background chronic small
vessel ischemia is unchanged. No hydrocephalus, the basilar cisterns
are patent.

Vascular: Atherosclerosis of skullbase vasculature without
hyperdense vessel or abnormal calcification.

Skull: Frontal hyperostosis, unchanged.  No acute finding.

Sinuses/Orbits: Bilateral cataract resection.  No acute abnormality.

Other: None.
IMPRESSION: 1.
Findings concerning for multifocal subacute ischemia involving the
right occipital, left parietooccipital, and left frontal lobe. These
areas of ischemia are larger than that seen on brain MRI 10 days ago
when there were multiple punctate acute infarcts. Consider MRI
follow-up.
2. No hemorrhage.  Stable degree of chronic small vessel ischemia.

## 2019-05-11 IMAGING — MR MR HEAD W/O CM
14 of 16 series · 37 of 48 positions shown · non-contrast
Comparison: Head CT 2626 hours today. Brain MRI 01/24/2018,
intracranial MRA 01/25/2018.

CLINICAL DATA: 89-year-old female diagnosed with numerous scattered
small cerebral infarcts on 01/24/2018. Altered mental status.

EXAM:
MRI HEAD WITHOUT CONTRAST
TECHNIQUE: Multiplanar, multiecho pulse sequences of the brain and surrounding
structures were obtained without intravenous contrast.

[Series 5: ax dwi_tracew · axial · 3.0mm · 1.50mm/px · z∈[-60,+75]mm · 5 of 92 slices shown]
[im 1/92]
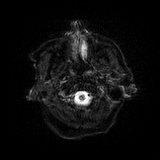
[im 23/92]
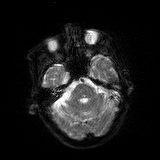
[im 46/92]
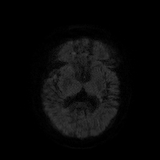
[im 69/92]
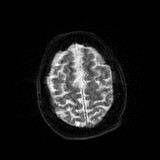
[im 92/92]
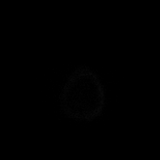

[Series 6: ax dwi_adc · axial · 3.0mm · 1.50mm/px · z∈[-60,+75]mm · 2 of 46 slices shown]
[im 1/46]
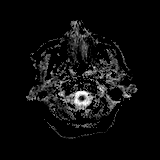
[im 46/46]
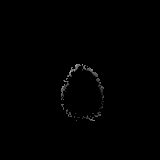

[Series 7: cor dwi_tracew · coronal · 5.0mm · 1.44mm/px · 4 of 64 slices shown]
[im 1/64]
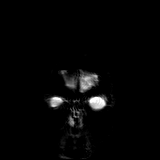
[im 22/64]
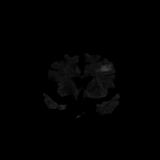
[im 43/64]
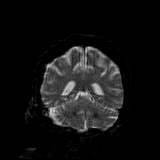
[im 64/64]
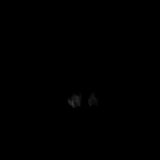

[Series 8: cor dwi_adc · coronal · 5.0mm · 1.44mm/px · 2 of 32 slices shown]
[im 1/32]
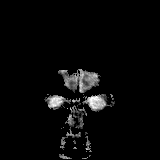
[im 32/32]
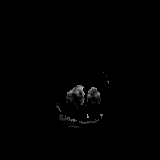

[Series 9: T1 · sagittal · 5.0mm · 0.75mm/px · 1 of 23 slices shown]
[im 1/23]
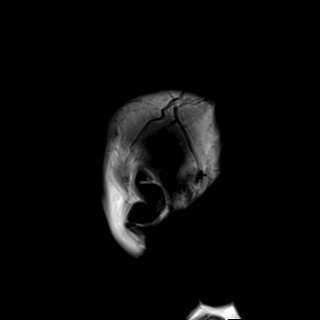

[Series 10: T2 · axial · 5.0mm · 0.72mm/px · z∈[-72,+72]mm · 2 of 25 slices shown (1 of 2)]
[im 1/25]
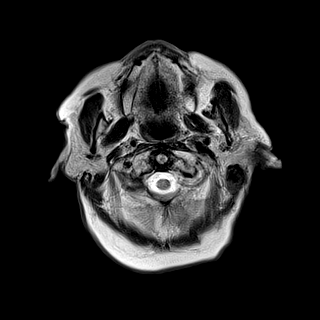
[im 25/25]
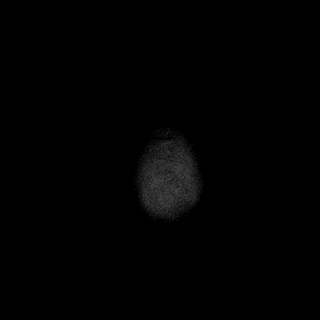

[Series 11: FLAIR · axial · 5.0mm · 0.45mm/px · z∈[-72,+72]mm · 2 of 25 slices shown (1 of 3)]
[im 1/25]
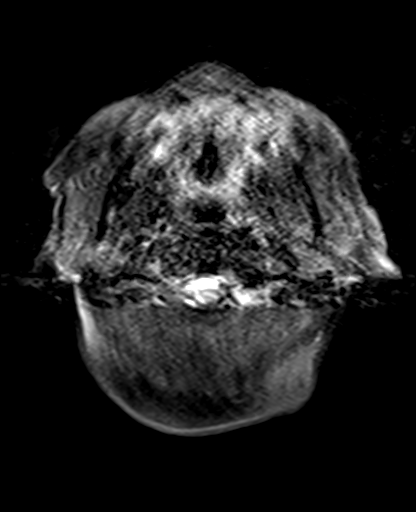
[im 25/25]
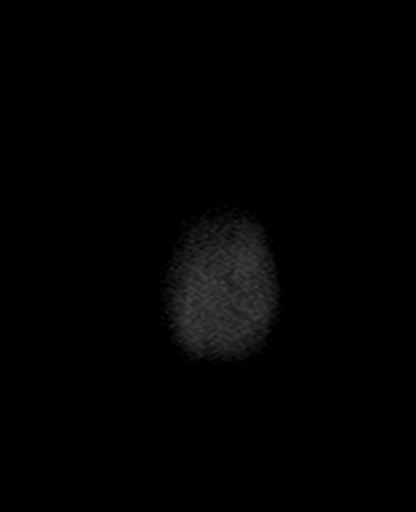

[Series 12: swi_images · axial · 3.0mm · 0.90mm/px · z∈[-71,+69]mm · 3 of 48 slices shown]
[im 1/48]
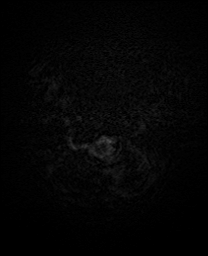
[im 24/48]
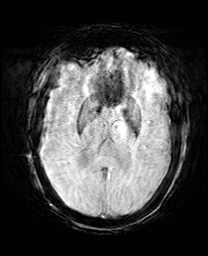
[im 48/48]
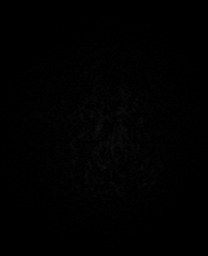

[Series 13: mip_images(sw) · axial · 24.0mm · 0.90mm/px · z∈[-61,+59]mm · 3 of 41 slices shown]
[im 1/41]
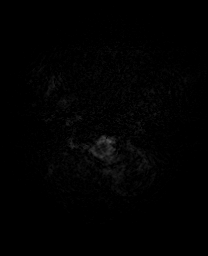
[im 21/41]
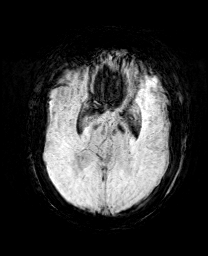
[im 41/41]
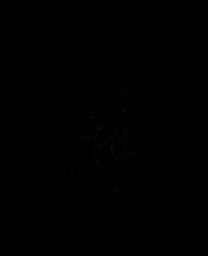

[Series 15: FLAIR · axial · 5.0mm · 0.90mm/px · z∈[-72,+72]mm · 2 of 25 slices shown (2 of 3)]
[im 1/25]
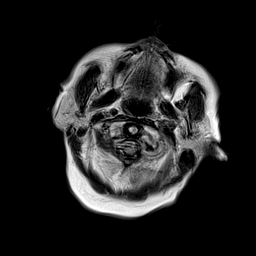
[im 25/25]
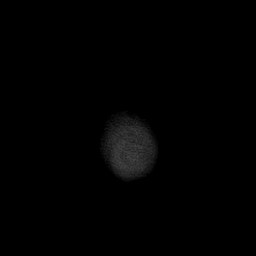

[Series 16: T2 · coronal · 5.0mm · 0.72mm/px · 2 of 28 slices shown (2 of 2)]
[im 1/28]
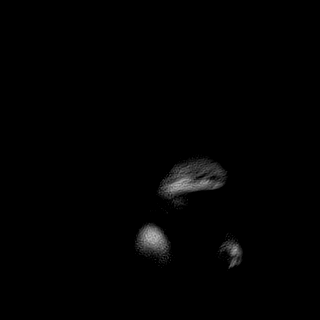
[im 28/28]
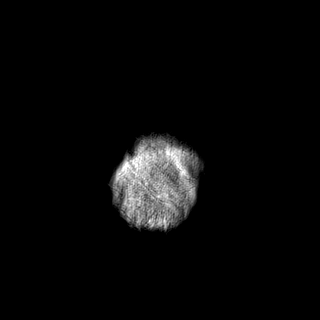

[Series 21: DWI · axial · 4.0mm · 0.88mm/px · z∈[-88,+48]mm · 5 of 72 slices shown (1 of 2)]
[im 1/72]
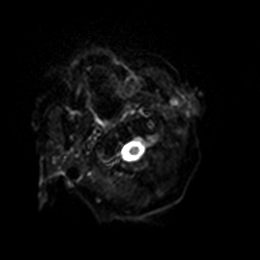
[im 18/72]
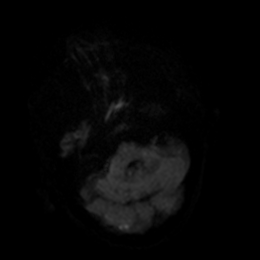
[im 36/72]
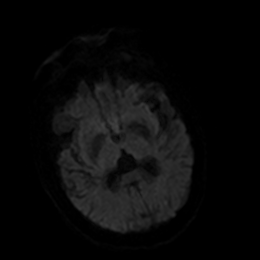
[im 54/72]
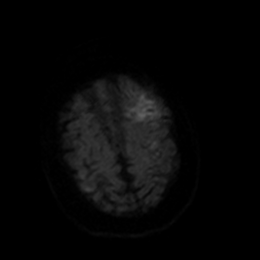
[im 72/72]
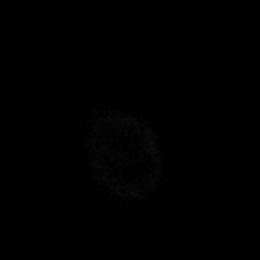

[Series 22: DWI · axial · 4.0mm · 0.88mm/px · z∈[-88,+48]mm · 2 of 36 slices shown (2 of 2)]
[im 1/36]
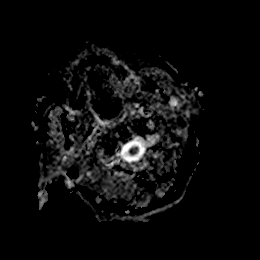
[im 36/36]
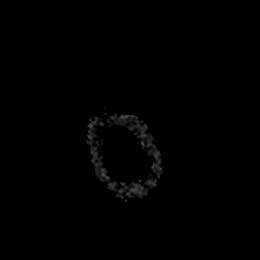

[Series 23: FLAIR · axial · 5.0mm · 0.90mm/px · z∈[-75,+68]mm · 2 of 25 slices shown (3 of 3)]
[im 1/25]
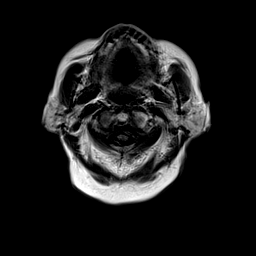
[im 25/25]
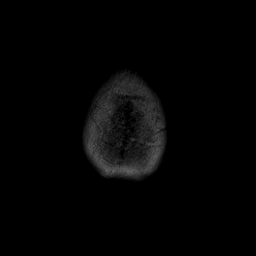

[37 of 48 positions shown; findings below may reference images not displayed]

FINDINGS: Brain: Study is intermittently degraded by motion artifact despite
repeated imaging attempts.

Motion artifact on diffusion-weighted imaging today is such that
small acute infarcts, such as those demonstrated on 01/24/2018,
could be missed.

Today, multifocal patchy acute infarcts on the range of 1-3
centimeters in size are demonstrated in the bilateral anterior and
posterior circulation. The bilateral cerebellum, bilateral occipital
poles, right parietal lobe, and left greater than right frontal
lobes are affected. Many of the same areas demonstrated punctate
infarcts on 01/24/2018.

Associated cytotoxic edema with T2 and FLAIR hyperintensity which
has increased in the affected areas. No associated mass effect. No
associated hemorrhage identified.

Today the deep gray matter nuclei and brainstem are relatively
spared. The

No midline shift, mass effect, evidence of mass lesion,
ventriculomegaly, extra-axial collection or acute intracranial
hemorrhage. Cervicomedullary junction and pituitary are within
normal limits.

Vascular: Major intracranial vascular flow voids are stable with
mild intracranial artery tortuosity.

Skull and upper cervical spine: The stable visible cervical spine
degeneration. Background bone marrow signal appears normal. Benign
appearing bony exostosis at the left vertex re-demonstrated.

Sinuses/Orbits: Stable and negative.

Other: Mastoid air cells remain clear. Visible internal auditory
structures appear normal. Stable scalp and face soft tissues.
IMPRESSION: 1. Continued bilateral anterior and posterior circulation ischemia.
This suggests ongoing embolic events from the heart or proximal
aorta.
2. Larger patchy 1-3 centimeter scattered infarcts demonstrated
today in both cerebral and cerebellar hemispheres. No associated
hemorrhage or mass effect.
3. Motion artifact on today's study such that small infarcts - like
those demonstrated on 01/24/2018 - would be obscured.
